# Patient Record
Sex: Female | Born: 1950
Health system: Southern US, Community
[De-identification: ages and names within clinical notes are randomized; demographics above are authoritative.]

## PROBLEM LIST (undated history)

## (undated) DIAGNOSIS — R51 Headache: Secondary | ICD-10-CM

## (undated) DIAGNOSIS — M67449 Ganglion, unspecified hand: Secondary | ICD-10-CM

## (undated) DIAGNOSIS — T4145XA Adverse effect of unspecified anesthetic, initial encounter: Secondary | ICD-10-CM

## (undated) DIAGNOSIS — M1990 Unspecified osteoarthritis, unspecified site: Secondary | ICD-10-CM

## (undated) DIAGNOSIS — Z98811 Dental restoration status: Secondary | ICD-10-CM

## (undated) DIAGNOSIS — E7800 Pure hypercholesterolemia, unspecified: Secondary | ICD-10-CM

## (undated) DIAGNOSIS — K219 Gastro-esophageal reflux disease without esophagitis: Secondary | ICD-10-CM

## (undated) DIAGNOSIS — T8859XA Other complications of anesthesia, initial encounter: Secondary | ICD-10-CM

## (undated) DIAGNOSIS — D649 Anemia, unspecified: Secondary | ICD-10-CM

## (undated) HISTORY — PX: APPENDECTOMY: SHX54

## (undated) HISTORY — DX: Pure hypercholesterolemia, unspecified: E78.00

## (undated) HISTORY — PX: CATARACT EXTRACTION: SUR2

## (undated) HISTORY — PX: ABDOMINAL HYSTERECTOMY: SHX81

## (undated) HISTORY — PX: OOPHORECTOMY: SHX86

## (undated) HISTORY — PX: ECTOPIC PREGNANCY SURGERY: SHX613

---

## 1999-01-23 ENCOUNTER — Other Ambulatory Visit: Admission: RE | Admit: 1999-01-23 | Discharge: 1999-01-23 | Payer: Self-pay | Admitting: Obstetrics and Gynecology

## 2000-04-08 ENCOUNTER — Other Ambulatory Visit: Admission: RE | Admit: 2000-04-08 | Discharge: 2000-04-08 | Payer: Self-pay | Admitting: Obstetrics and Gynecology

## 2001-08-06 ENCOUNTER — Other Ambulatory Visit: Admission: RE | Admit: 2001-08-06 | Discharge: 2001-08-06 | Payer: Self-pay | Admitting: Internal Medicine

## 2001-12-22 ENCOUNTER — Emergency Department (HOSPITAL_COMMUNITY): Admission: EM | Admit: 2001-12-22 | Discharge: 2001-12-22 | Payer: Self-pay | Admitting: Emergency Medicine

## 2001-12-22 ENCOUNTER — Encounter: Payer: Self-pay | Admitting: Emergency Medicine

## 2002-01-05 ENCOUNTER — Ambulatory Visit (HOSPITAL_COMMUNITY): Admission: RE | Admit: 2002-01-05 | Discharge: 2002-01-05 | Payer: Self-pay | Admitting: Orthopedic Surgery

## 2002-01-05 ENCOUNTER — Encounter: Payer: Self-pay | Admitting: Orthopedic Surgery

## 2003-06-06 ENCOUNTER — Ambulatory Visit (HOSPITAL_COMMUNITY): Admission: RE | Admit: 2003-06-06 | Discharge: 2003-06-06 | Payer: Self-pay | Admitting: Gastroenterology

## 2004-07-03 ENCOUNTER — Ambulatory Visit (HOSPITAL_COMMUNITY): Admission: RE | Admit: 2004-07-03 | Discharge: 2004-07-03 | Payer: Self-pay | Admitting: Chiropractic Medicine

## 2005-02-24 IMAGING — CR DG LUMBAR SPINE COMPLETE 4+V
5 series · 5 of 5 positions shown · non-contrast
Comparison: none

CLINICAL DATA: Low back and left leg pain following a twisting injury.  Previous pelvic fracture.  
COMPLETE LUMBAR SPINE ? 07/03/04
Five views demonstrate five non-rib bearing lumbar vertebrae.   Partially lumbarized S1 vertebra.  4 mm of retrolisthesis at the L5-S1 level.  Mild facet degenerative changes in the lower lumbar spine.  No pars defects seen.  Mild anterior spur formation at multiple levels.  No fractures.  Moderate anterior spur formation at the T10-11 level.    
IMPRESSION 
Degenerative changes, as described above.  No fracture.

[view not recorded (1 of 5)]
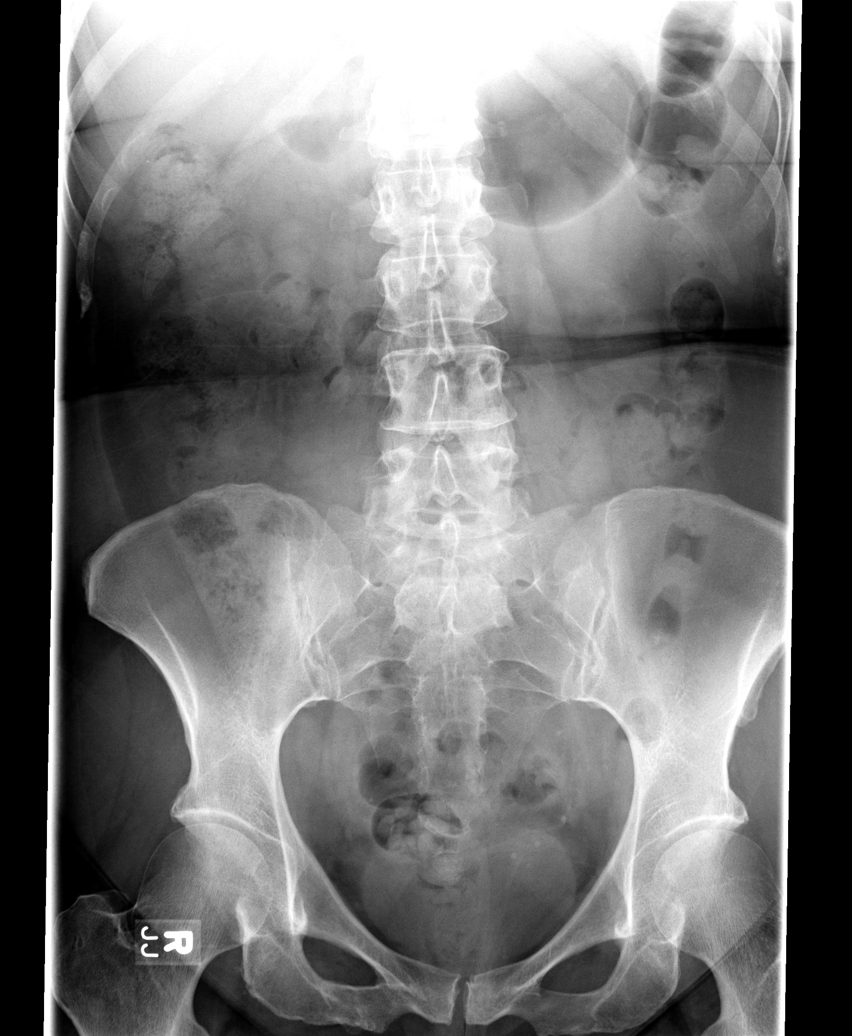

[view not recorded (2 of 5)]
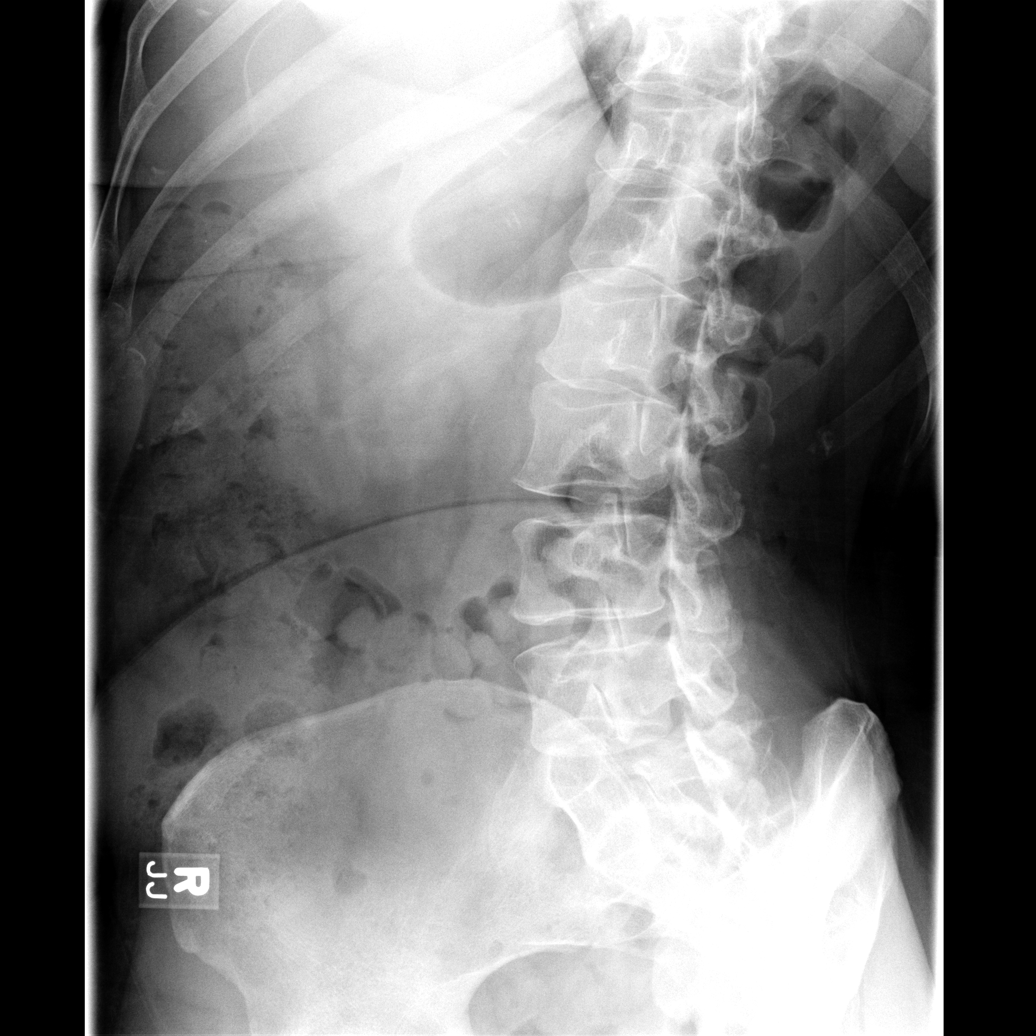

[view not recorded (3 of 5)]
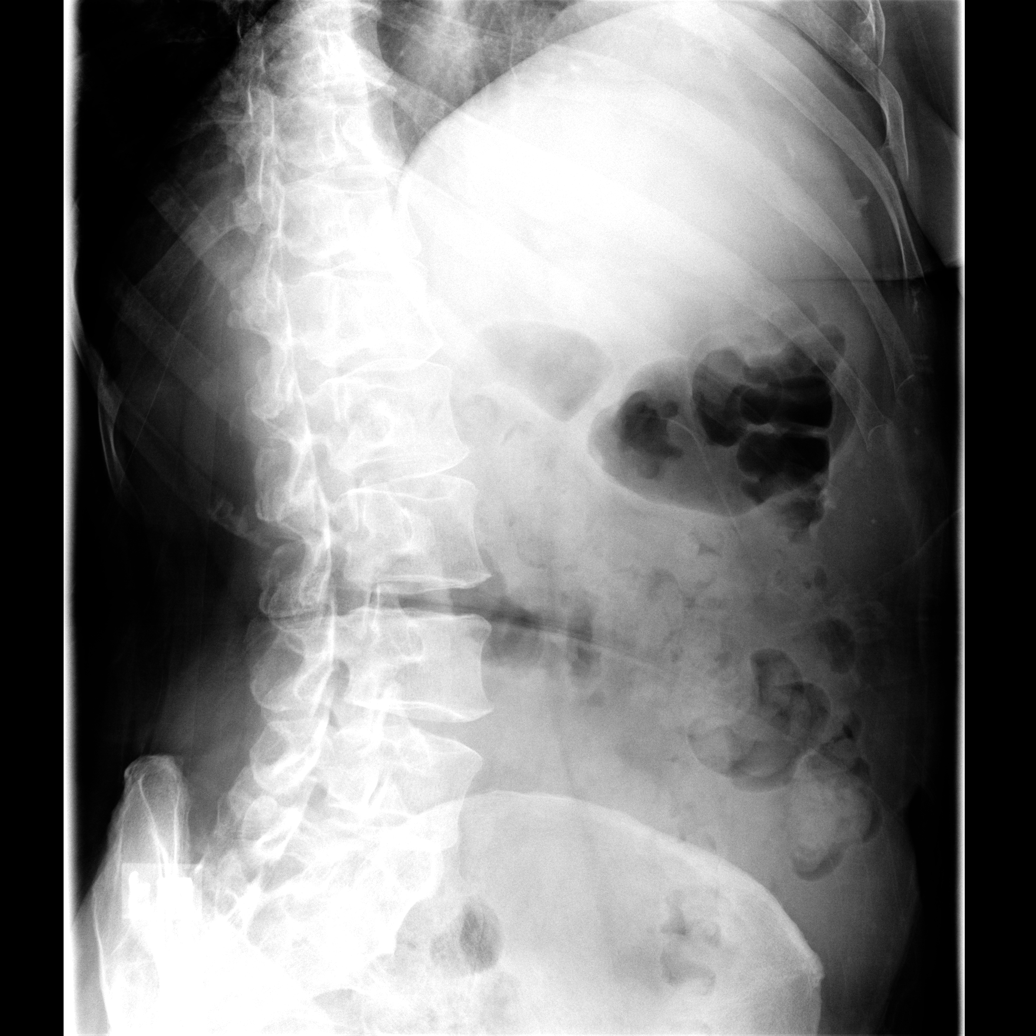

[view not recorded (4 of 5)]
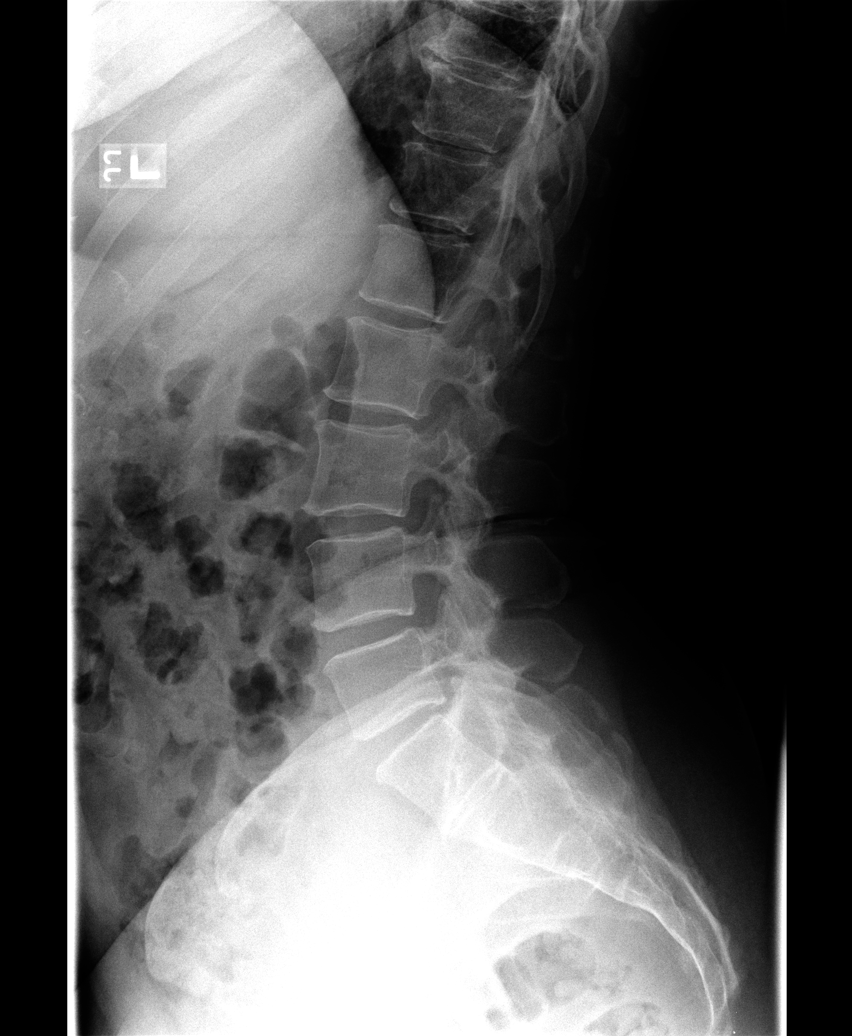

[view not recorded (5 of 5)]
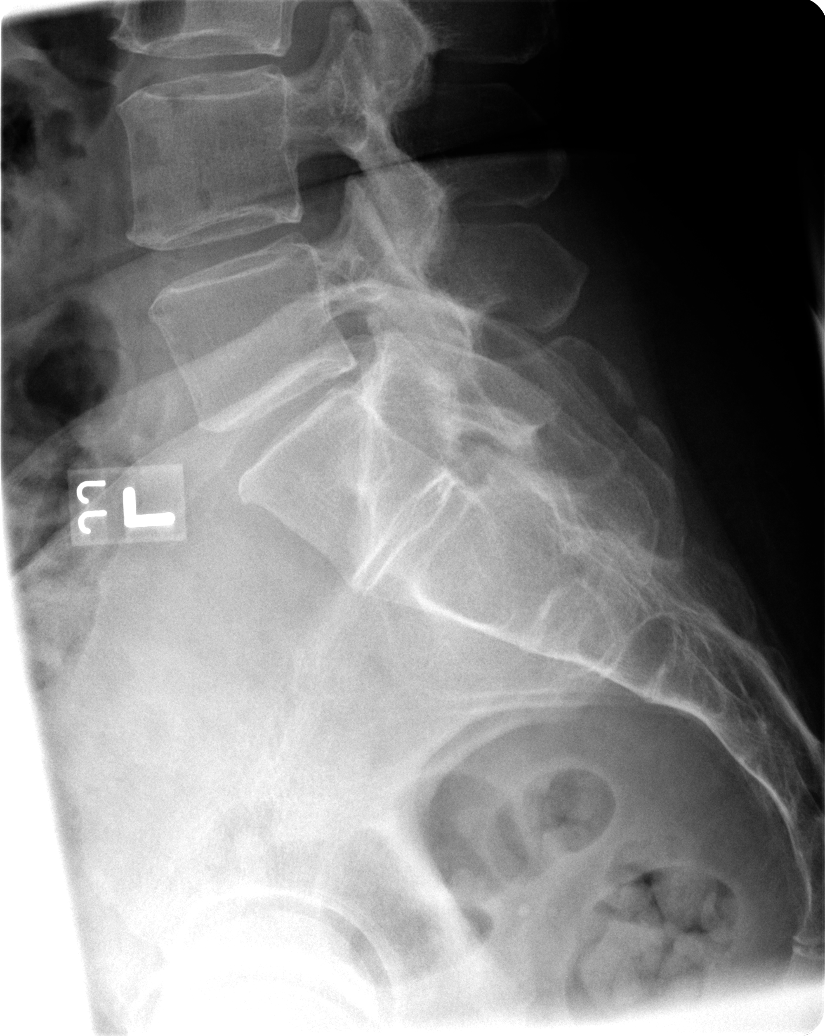

[5 of 5 positions shown; findings below may reference images not displayed]

## 2012-06-18 DIAGNOSIS — M67449 Ganglion, unspecified hand: Secondary | ICD-10-CM

## 2012-06-18 HISTORY — DX: Ganglion, unspecified hand: M67.449

## 2012-07-07 ENCOUNTER — Other Ambulatory Visit: Payer: Self-pay | Admitting: Orthopedic Surgery

## 2012-07-07 ENCOUNTER — Encounter (HOSPITAL_BASED_OUTPATIENT_CLINIC_OR_DEPARTMENT_OTHER): Payer: Self-pay | Admitting: *Deleted

## 2012-07-09 NOTE — H&P (Signed)
  Melissa Mcdowell is an 61 y.o. female.   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 She presents with her husband for evaluation of a cyst on the dorsal ulnar aspect of her left long finger. This has been present for more than 3 weeks.   Past Medical History  Diagnosis Date  . Headache     sinus - 3-4 x/month  . Anemia     no current med.  . Arthritis     lower back, fingers  . Acid reflux     occasional  . Mucous cyst of finger 06/2012    left long finger  . Dental crowns present   . Complication of anesthesia     states has a small mouth    Past Surgical History  Procedure Date  . Oophorectomy   . Cesarean section   . Abdominal hysterectomy     complete  . Ectopic pregnancy surgery   . Appendectomy     Family History  Problem Relation Age of Onset  . Anesthesia problems Father     post-op N/V   Social History:  reports that she has never smoked. She has never used smokeless tobacco. She reports that she drinks alcohol. She reports that she does not use illicit drugs.  Allergies:  Allergies  Allergen Reactions  . Sulfa Antibiotics Nausea And Vomiting  . Adhesive (Tape) Other (See Comments)    SKIN FLAKES OFF  . Morphine And Related Other (See Comments)    HYPERACTIVITY AND HAS NO RELIEF OF PAIN    No prescriptions prior to admission    No results found for this or any previous visit (from the past 48 hour(s)).  No results found.   Pertinent items are noted in HPI.  Height 5\' 4"  (1.626 m), weight 76.204 kg (168 lb).  General appearance: alert Head: Normocephalic, without obvious abnormality Neck: supple, symmetrical, trachea midline Resp: clear to auscultation bilaterally Cardio: regular rate and rhythm GI: normal findings: bowel sounds normal Extremities: Inspection of her hands reveals a 4 mm in diameter mucoid cyst in the dorsal ulnar aspect of her left long finger. She has full  ROM of her fingers in flexion/extension. I cannot detect an effusion. She has no nail deformities. Her pulses and capillary refill are intact.   X-rays of her finger 4 views demonstrates degenerative arthritis of the left long finger DIP joint with a loose body subjacent to the cyst.  Pulses: 2+ and symmetric Skin: normal Neurologic: Grossly normal    Assessment/Plan Impression: Mucoid cyst left long finger DIP joint  Plan: To the OR for excision mucoid cyst left long finger with DIP joint arthrotomy.The procedure, risks,benefits and post-op course were discussed with the patient at length and they were in agreement with the plan.   DASNOIT,Melissa Mcdowell 07/09/2012, 2:48 PM     H&P documentation: 07/10/2012  -History and Physical Reviewed  -Patient has been re-examined  -No change in the plan of care  Wyn Forster, MD

## 2012-07-10 ENCOUNTER — Encounter (HOSPITAL_BASED_OUTPATIENT_CLINIC_OR_DEPARTMENT_OTHER): Payer: Self-pay | Admitting: Anesthesiology

## 2012-07-10 ENCOUNTER — Encounter (HOSPITAL_BASED_OUTPATIENT_CLINIC_OR_DEPARTMENT_OTHER)
Admission: RE | Disposition: A | Discharge status: Home / Self Care | Payer: Self-pay | Source: Ambulatory Visit | Attending: Orthopedic Surgery

## 2012-07-10 ENCOUNTER — Encounter (HOSPITAL_BASED_OUTPATIENT_CLINIC_OR_DEPARTMENT_OTHER): Payer: Self-pay | Admitting: *Deleted

## 2012-07-10 ENCOUNTER — Ambulatory Visit (HOSPITAL_BASED_OUTPATIENT_CLINIC_OR_DEPARTMENT_OTHER)
Admission: RE | Admit: 2012-07-10 | Discharge: 2012-07-10 | Disposition: A | Discharge status: Home / Self Care | Payer: 59 | Source: Ambulatory Visit | Attending: Orthopedic Surgery | Admitting: Orthopedic Surgery

## 2012-07-10 DIAGNOSIS — M129 Arthropathy, unspecified: Secondary | ICD-10-CM | POA: Insufficient documentation

## 2012-07-10 DIAGNOSIS — M24049 Loose body in unspecified finger joint(s): Secondary | ICD-10-CM | POA: Insufficient documentation

## 2012-07-10 DIAGNOSIS — M6740 Ganglion, unspecified site: Secondary | ICD-10-CM | POA: Insufficient documentation

## 2012-07-10 DIAGNOSIS — R51 Headache: Secondary | ICD-10-CM | POA: Insufficient documentation

## 2012-07-10 DIAGNOSIS — K219 Gastro-esophageal reflux disease without esophagitis: Secondary | ICD-10-CM | POA: Insufficient documentation

## 2012-07-10 HISTORY — PX: MASS EXCISION: SHX2000

## 2012-07-10 HISTORY — DX: Gastro-esophageal reflux disease without esophagitis: K21.9

## 2012-07-10 HISTORY — DX: Adverse effect of unspecified anesthetic, initial encounter: T41.45XA

## 2012-07-10 HISTORY — DX: Headache: R51

## 2012-07-10 HISTORY — DX: Unspecified osteoarthritis, unspecified site: M19.90

## 2012-07-10 HISTORY — DX: Other complications of anesthesia, initial encounter: T88.59XA

## 2012-07-10 HISTORY — DX: Dental restoration status: Z98.811

## 2012-07-10 HISTORY — DX: Anemia, unspecified: D64.9

## 2012-07-10 HISTORY — DX: Ganglion, unspecified hand: M67.449

## 2012-07-10 SURGERY — MINOR EXCISION OF MASS
Anesthesia: LOCAL | Site: Finger | Laterality: Left | Wound class: Clean

## 2012-07-10 MED ORDER — LACTATED RINGERS IV SOLN: INTRAVENOUS | Status: DC

## 2012-07-10 MED ORDER — DOXYCYCLINE HYCLATE 100 MG PO TABS: 100.0000 mg | ORAL_TABLET | Freq: Two times a day (BID) | ORAL | Status: AC

## 2012-07-10 MED ORDER — HYDROCODONE-ACETAMINOPHEN 5-325 MG PO TABS: ORAL_TABLET | ORAL | Status: AC

## 2012-07-10 MED ORDER — CHLORHEXIDINE GLUCONATE 4 % EX LIQD: 60.0000 mL | Freq: Once | CUTANEOUS | Status: DC

## 2012-07-10 MED ORDER — LIDOCAINE HCL (PF) 2 % IJ SOLN
INTRAMUSCULAR | Status: DC | PRN
  Administered 2012-07-10: 4 mL

## 2012-07-10 SURGICAL SUPPLY — 53 items
BANDAGE ADHESIVE 1X3 (GAUZE/BANDAGES/DRESSINGS) IMPLANT
BANDAGE ELASTIC 3 VELCRO ST LF (GAUZE/BANDAGES/DRESSINGS) IMPLANT
BLADE MINI RND TIP GREEN BEAV (BLADE) IMPLANT
BLADE SURG 15 STRL LF DISP TIS (BLADE) ×2 IMPLANT
BLADE SURG 15 STRL SS (BLADE) ×3
BNDG CMPR 9X4 STRL LF SNTH (GAUZE/BANDAGES/DRESSINGS)
BNDG CMPR MD 5X2 ELC HKLP STRL (GAUZE/BANDAGES/DRESSINGS)
BNDG COHESIVE 1X5 TAN STRL LF (GAUZE/BANDAGES/DRESSINGS) ×3 IMPLANT
BNDG ELASTIC 2 VLCR STRL LF (GAUZE/BANDAGES/DRESSINGS) IMPLANT
BNDG ESMARK 4X9 LF (GAUZE/BANDAGES/DRESSINGS) IMPLANT
BRUSH SCRUB EZ PLAIN DRY (MISCELLANEOUS) ×3 IMPLANT
CLOTH BEACON ORANGE TIMEOUT ST (SAFETY) ×3 IMPLANT
CORDS BIPOLAR (ELECTRODE) IMPLANT
COVER MAYO STAND STRL (DRAPES) ×3 IMPLANT
COVER TABLE BACK 60X90 (DRAPES) ×1 IMPLANT
CUFF TOURNIQUET SINGLE 18IN (TOURNIQUET CUFF) IMPLANT
DECANTER SPIKE VIAL GLASS SM (MISCELLANEOUS) IMPLANT
DRAIN PENROSE 1/2X12 LTX STRL (WOUND CARE) ×2 IMPLANT
DRAIN PENROSE 1/4X12 LTX STRL (WOUND CARE) IMPLANT
DRAPE EXTREMITY T 121X128X90 (DRAPE) ×1 IMPLANT
DRAPE SURG 17X23 STRL (DRAPES) ×3 IMPLANT
GAUZE XEROFORM 1X8 LF (GAUZE/BANDAGES/DRESSINGS) ×2 IMPLANT
GLOVE BIOGEL PI IND STRL 8 (GLOVE) ×2 IMPLANT
GLOVE BIOGEL PI INDICATOR 8 (GLOVE) ×1
GLOVE ORTHO TXT STRL SZ7.5 (GLOVE) ×3 IMPLANT
GLOVE SKINSENSE NS SZ7.0 (GLOVE) ×1
GLOVE SKINSENSE STRL SZ7.0 (GLOVE) ×1 IMPLANT
GOWN PREVENTION PLUS XLARGE (GOWN DISPOSABLE) ×3 IMPLANT
GOWN STRL REIN XL XLG (GOWN DISPOSABLE) ×6 IMPLANT
NDL BLUNT 17GA (NEEDLE) ×1 IMPLANT
NEEDLE 27GAX1X1/2 (NEEDLE) ×2 IMPLANT
NEEDLE BLUNT 17GA (NEEDLE) ×3 IMPLANT
PACK BASIN DAY SURGERY FS (CUSTOM PROCEDURE TRAY) ×3 IMPLANT
PAD CAST 3X4 CTTN HI CHSV (CAST SUPPLIES) IMPLANT
PADDING CAST COTTON 3X4 STRL (CAST SUPPLIES)
PADDING UNDERCAST 2  STERILE (CAST SUPPLIES) IMPLANT
SPLINT PLASTER CAST XFAST 3X15 (CAST SUPPLIES) IMPLANT
SPLINT PLASTER XTRA FASTSET 3X (CAST SUPPLIES)
SPONGE GAUZE 4X4 12PLY (GAUZE/BANDAGES/DRESSINGS) IMPLANT
STOCKINETTE 4X48 STRL (DRAPES) ×3 IMPLANT
STRIP CLOSURE SKIN 1/2X4 (GAUZE/BANDAGES/DRESSINGS) IMPLANT
SUT ETHILON 5 0 P 3 18 (SUTURE) ×1
SUT MERSILENE 4 0 P 3 (SUTURE) IMPLANT
SUT NYLON ETHILON 5-0 P-3 1X18 (SUTURE) ×2 IMPLANT
SUT PROLENE 3 0 PS 2 (SUTURE) IMPLANT
SUT PROLENE 4 0 P 3 18 (SUTURE) ×2 IMPLANT
SYR 20CC LL (SYRINGE) ×3 IMPLANT
SYR 3ML 23GX1 SAFETY (SYRINGE) IMPLANT
SYR CONTROL 10ML LL (SYRINGE) ×2 IMPLANT
TOWEL OR 17X24 6PK STRL BLUE (TOWEL DISPOSABLE) ×3 IMPLANT
TRAY DSU PREP LF (CUSTOM PROCEDURE TRAY) ×3 IMPLANT
UNDERPAD 30X30 INCONTINENT (UNDERPADS AND DIAPERS) ×3 IMPLANT
WATER STERILE IRR 1000ML POUR (IV SOLUTION) IMPLANT

## 2012-07-10 NOTE — Anesthesia Preprocedure Evaluation (Deleted)
Anesthesia Evaluation  Patient identified by MRN, date of birth, ID band Patient awake    Reviewed: Allergy & Precautions, H&P , NPO status , Patient's Chart, lab work & pertinent test results  Airway Mallampati: II  Neck ROM: full    Dental   Pulmonary          Cardiovascular     Neuro/Psych  Headaches,    GI/Hepatic GERD-  ,  Endo/Other    Renal/GU      Musculoskeletal  (+) Arthritis -,   Abdominal   Peds  Hematology   Anesthesia Other Findings   Reproductive/Obstetrics                           Anesthesia Physical Anesthesia Plan  ASA: II  Anesthesia Plan:    Post-op Pain Management:    Induction:   Airway Management Planned: Simple Face Mask  Additional Equipment:   Intra-op Plan:   Post-operative Plan:   Informed Consent: I have reviewed the patients History and Physical, chart, labs and discussed the procedure including the risks, benefits and alternatives for the proposed anesthesia with the patient or authorized representative who has indicated his/her understanding and acceptance.     Plan Discussed with: CRNA and Surgeon  Anesthesia Plan Comments:        Anesthesia Quick Evaluation

## 2012-07-10 NOTE — Progress Notes (Signed)
After discussion with Dr. Chaney Malling and Dr. Teressa Senter, decision made to use straight local.

## 2012-07-10 NOTE — Brief Op Note (Signed)
07/10/2012  9:02 AM  PATIENT:  Melissa Mcdowell  61 y.o. female  PRE-OPERATIVE DIAGNOSIS:  mucoid cyst left long finger  POST-OPERATIVE DIAGNOSIS:  Mucoid cyst with dip loose body left long  PROCEDURE: excision of mucoid cyst left long finger with dip joint debridement   SURGEON:  Surgeon(s) and Role:    * Wyn Forster., MD - Primary  PHYSICIAN ASSISTANT:   ASSISTANTS: Mallory Shirk.A-C    ANESTHESIA:   local  EBL:     BLOOD ADMINISTERED:none  DRAINS: none   LOCAL MEDICATIONS USED:  LIDOCAINE   SPECIMEN:  No Specimen  DISPOSITION OF SPECIMEN:  N/A  COUNTS:  YES  TOURNIQUET:  * Missing tourniquet times found for documented tourniquets in log:  16109 *  DICTATION: .Other Dictation: Dictation Number [id number]  PLAN OF CARE: Discharge to home after PACU  PATIENT DISPOSITION:  PACU - hemodynamically stable.

## 2012-07-10 NOTE — Op Note (Signed)
[id number] 

## 2012-07-13 NOTE — Op Note (Signed)
NAMEJANI, Melissa Mcdowell            ACCOUNT NO.:  [id number]  MEDICAL RECORD NO.:  [id number]  LOCATION:                                 FACILITY:  PHYSICIAN:  Katy Fitch. Jackalyn Haith, M.D. DATE OF BIRTH:  [birth year]  DATE OF PROCEDURE:  07/10/2012 DATE OF DISCHARGE:                              OPERATIVE REPORT   PREOPERATIVE DIAGNOSIS:  Mucoid cyst, left long finger distal interphalangeal joint with x-ray documentation of loose body, ulnar aspect of distal interphalangeal joint adjacent to ulnar collateral ligament.  POSTOPERATIVE DIAGNOSIS:  Mucoid cyst, left long finger distal interphalangeal joint with x-ray documentation of loose body, ulnar aspect of distal interphalangeal joint adjacent to ulnar collateral ligament.  OPERATION:  Debridement of mucoid cyst and DIP joint arthrotomy, synovectomy, and removal of loose body.  OPERATING SURGEON:  Katy Fitch. Nayel Purdy, MD  ASSISTANT:  Marveen Reeks Dasnoit, PA-C  ANESTHESIA:  A 2% lidocaine metacarpal head level block of left long finger.  This was performed by Dr. Teressa Senter.  This is a minor operating room procedure.  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  She has had a history of mucoid cyst developed on dorsal ulnar aspect of her left long finger.  She reviewed this with Dr. Pete Glatter, her primary care physician, and it was advised to seek Hand Surgery consult.  Clinical examination revealed a 5-mm diameter mucoid cyst that was quite tense.  She had not developed drainage nor signs of sepsis. She did not have a nail deformity.  We advised her to undergo x-ray evaluation of the finger and debridement.  Her x-ray did reveal loose body adjacent to the ulnar collateral ligament in the DIP joint.  Preoperative question invited and answered in detail.  PROCEDURE IN DETAIL:  The patient was brought to room 1 at [facility] and placed in supine position on the operating table.  Following the  performance of routine surgical time-out, the long finger was prepped with Betadine and 4 mL of 2% lidocaine were infiltrated at metacarpal head level to obtain a digital block.  The left hand and arm were then prepped with Betadine soap and solution, sterilely draped.  Following exsanguination of the left long finger with a gauze wrap, a 0.5 inch Penrose drain was placed with the proximal phalangeal segment as a digital tourniquet.  Procedure commenced with an incision  incorporating the cyst. Subcutaneous tissues were carefully divided taking care to identify the membrane of the cyst, removed mucinous contents and the membrane.  A capsulotomy was performed between the terminal extensor tendon and the ulnar collateral ligament.  After synovectomy, the  loose bodies were identified and removed with a micro rongeur.  We showed Melissa Mcdowell the loose body.  A synovectomy was accomplished with a micro rongeur followed by irrigation of the joint with a 19-gauge blunt dental needle.  The wound was then repaired with intradermal 4-0 Prolene.  The finger was then dressed with Xeroflo sterile gauze and a Coban finger dressing.  We have advised Melissa Mcdowell to use Aleve 440 mg morning and evening as an analgesic and we will provide Vicodin 5 mg 1 p.o. q.4-6 hours p.r.n. pain as  an adjunctive analgesic.  We will see her back in followup in a week for suture removal.     Katy Fitch. Breannah Kratt, M.D.     RVS/MEDQ  D:  07/10/2012  T:  07/10/2012  Job:  [id number]  cc:   Hal T. Stoneking, M.D.

## 2012-07-14 ENCOUNTER — Encounter (HOSPITAL_BASED_OUTPATIENT_CLINIC_OR_DEPARTMENT_OTHER): Payer: Self-pay | Admitting: Orthopedic Surgery

## 2018-11-09 DIAGNOSIS — Z1231 Encounter for screening mammogram for malignant neoplasm of breast: Secondary | ICD-10-CM | POA: Diagnosis not present

## 2018-11-24 DIAGNOSIS — E7800 Pure hypercholesterolemia, unspecified: Secondary | ICD-10-CM | POA: Diagnosis not present

## 2018-11-24 DIAGNOSIS — Z79899 Other long term (current) drug therapy: Secondary | ICD-10-CM | POA: Diagnosis not present

## 2018-11-24 DIAGNOSIS — Z0000 Encounter for general adult medical examination without abnormal findings: Secondary | ICD-10-CM | POA: Diagnosis not present

## 2018-11-24 DIAGNOSIS — Z1389 Encounter for screening for other disorder: Secondary | ICD-10-CM | POA: Diagnosis not present

## 2018-11-30 DIAGNOSIS — H33312 Horseshoe tear of retina without detachment, left eye: Secondary | ICD-10-CM | POA: Diagnosis not present

## 2018-11-30 DIAGNOSIS — H33191 Other retinoschisis and retinal cysts, right eye: Secondary | ICD-10-CM | POA: Diagnosis not present

## 2018-11-30 DIAGNOSIS — H35372 Puckering of macula, left eye: Secondary | ICD-10-CM | POA: Diagnosis not present

## 2018-11-30 DIAGNOSIS — H43812 Vitreous degeneration, left eye: Secondary | ICD-10-CM | POA: Diagnosis not present

## 2018-11-30 DIAGNOSIS — H35342 Macular cyst, hole, or pseudohole, left eye: Secondary | ICD-10-CM | POA: Diagnosis not present

## 2018-12-23 ENCOUNTER — Other Ambulatory Visit: Payer: Self-pay

## 2018-12-23 ENCOUNTER — Ambulatory Visit (HOSPITAL_COMMUNITY): Payer: Medicare Other | Admitting: Certified Registered Nurse Anesthetist

## 2018-12-23 ENCOUNTER — Encounter (HOSPITAL_COMMUNITY): Payer: Self-pay | Admitting: *Deleted

## 2018-12-23 ENCOUNTER — Ambulatory Visit (HOSPITAL_COMMUNITY)
Admission: RE | Admit: 2018-12-23 | Discharge: 2018-12-23 | Disposition: A | Discharge status: Home / Self Care | Payer: Medicare Other | Source: Ambulatory Visit | Attending: Ophthalmology | Admitting: Ophthalmology

## 2018-12-23 ENCOUNTER — Ambulatory Visit: Payer: Self-pay | Admitting: Ophthalmology

## 2018-12-23 ENCOUNTER — Encounter (HOSPITAL_COMMUNITY)
Admission: RE | Disposition: A | Discharge status: Home / Self Care | Payer: Self-pay | Source: Ambulatory Visit | Attending: Ophthalmology

## 2018-12-23 DIAGNOSIS — Z79890 Hormone replacement therapy (postmenopausal): Secondary | ICD-10-CM | POA: Insufficient documentation

## 2018-12-23 DIAGNOSIS — Z79899 Other long term (current) drug therapy: Secondary | ICD-10-CM | POA: Insufficient documentation

## 2018-12-23 DIAGNOSIS — H33022 Retinal detachment with multiple breaks, left eye: Secondary | ICD-10-CM | POA: Diagnosis not present

## 2018-12-23 DIAGNOSIS — Z880 Allergy status to penicillin: Secondary | ICD-10-CM | POA: Diagnosis not present

## 2018-12-23 DIAGNOSIS — K219 Gastro-esophageal reflux disease without esophagitis: Secondary | ICD-10-CM | POA: Insufficient documentation

## 2018-12-23 DIAGNOSIS — Z882 Allergy status to sulfonamides status: Secondary | ICD-10-CM | POA: Diagnosis not present

## 2018-12-23 DIAGNOSIS — H3322 Serous retinal detachment, left eye: Secondary | ICD-10-CM | POA: Insufficient documentation

## 2018-12-23 DIAGNOSIS — Z791 Long term (current) use of non-steroidal anti-inflammatories (NSAID): Secondary | ICD-10-CM | POA: Insufficient documentation

## 2018-12-23 DIAGNOSIS — H31092 Other chorioretinal scars, left eye: Secondary | ICD-10-CM | POA: Diagnosis not present

## 2018-12-23 DIAGNOSIS — Z885 Allergy status to narcotic agent status: Secondary | ICD-10-CM | POA: Diagnosis not present

## 2018-12-23 DIAGNOSIS — H35372 Puckering of macula, left eye: Secondary | ICD-10-CM | POA: Diagnosis not present

## 2018-12-23 DIAGNOSIS — H53452 Other localized visual field defect, left eye: Secondary | ICD-10-CM | POA: Diagnosis not present

## 2018-12-23 DIAGNOSIS — H33012 Retinal detachment with single break, left eye: Secondary | ICD-10-CM | POA: Diagnosis not present

## 2018-12-23 DIAGNOSIS — H33102 Unspecified retinoschisis, left eye: Secondary | ICD-10-CM | POA: Diagnosis not present

## 2018-12-23 HISTORY — PX: REPAIR OF COMPLEX TRACTION RETINAL DETACHMENT: SHX6217

## 2018-12-23 LAB — CBC
HCT: 41 % (ref 36.0–46.0)
HEMOGLOBIN: 13.2 g/dL (ref 12.0–15.0)
MCH: 31.2 pg (ref 26.0–34.0)
MCHC: 32.2 g/dL (ref 30.0–36.0)
MCV: 96.9 fL (ref 80.0–100.0)
Platelets: 199 10*3/uL (ref 150–400)
RBC: 4.23 MIL/uL (ref 3.87–5.11)
RDW: 14.1 % (ref 11.5–15.5)
WBC: 6.2 10*3/uL (ref 4.0–10.5)
nRBC: 0 % (ref 0.0–0.2)

## 2018-12-23 SURGERY — REPAIR, RETINAL DETACHMENT, COMPLEX
Anesthesia: Monitor Anesthesia Care | Site: Eye | Laterality: Left

## 2018-12-23 MED ORDER — EPINEPHRINE PF 1 MG/ML IJ SOLN
INTRAMUSCULAR | Status: AC
  Filled 2018-12-23: qty 1

## 2018-12-23 MED ORDER — BSS PLUS IO SOLN
INTRAOCULAR | Status: DC | PRN
  Administered 2018-12-23: 1 via INTRAOCULAR

## 2018-12-23 MED ORDER — LIDOCAINE HCL (PF) 1 % IJ SOLN: INTRAMUSCULAR | Status: DC | PRN

## 2018-12-23 MED ORDER — OFLOXACIN 0.3 % OP SOLN
1.0000 [drp] | OPHTHALMIC | Status: AC | PRN
  Administered 2018-12-23 (×3): 1 [drp] via OPHTHALMIC

## 2018-12-23 MED ORDER — PHENYLEPHRINE HCL 2.5 % OP SOLN
OPHTHALMIC | Status: AC
Start: 2018-12-23 — End: 2018-12-23
  Administered 2018-12-23: 1 [drp] via OPHTHALMIC
  Filled 2018-12-23: qty 2

## 2018-12-23 MED ORDER — MIDAZOLAM HCL 5 MG/5ML IJ SOLN
INTRAMUSCULAR | Status: DC | PRN
  Administered 2018-12-23: 1 mg via INTRAVENOUS

## 2018-12-23 MED ORDER — HYALURONIDASE HUMAN 150 UNIT/ML IJ SOLN
INTRAMUSCULAR | Status: AC
  Filled 2018-12-23: qty 1

## 2018-12-23 MED ORDER — PROPOFOL 10 MG/ML IV BOLUS
INTRAVENOUS | Status: DC | PRN
  Administered 2018-12-23: 20 mg via INTRAVENOUS
  Administered 2018-12-23: 40 mg via INTRAVENOUS

## 2018-12-23 MED ORDER — ATROPINE SULFATE 1 % OP SOLN
OPHTHALMIC | Status: AC
  Filled 2018-12-23: qty 5

## 2018-12-23 MED ORDER — FENTANYL CITRATE (PF) 100 MCG/2ML IJ SOLN: 25.0000 ug | INTRAMUSCULAR | Status: DC | PRN

## 2018-12-23 MED ORDER — BUPIVACAINE HCL (PF) 0.75 % IJ SOLN
INTRAMUSCULAR | Status: AC
  Filled 2018-12-23: qty 10

## 2018-12-23 MED ORDER — INDOCYANINE GREEN 25 MG IV SOLR
INTRAVENOUS | Status: AC
  Filled 2018-12-23: qty 25

## 2018-12-23 MED ORDER — CEFAZOLIN SODIUM 1 G IJ SOLR
INTRAMUSCULAR | Status: DC | PRN
  Administered 2018-12-23: 100 mg via INTRAMUSCULAR

## 2018-12-23 MED ORDER — CYCLOPENTOLATE HCL 1 % OP SOLN
1.0000 [drp] | OPHTHALMIC | Status: AC | PRN
  Administered 2018-12-23 (×3): 1 [drp] via OPHTHALMIC

## 2018-12-23 MED ORDER — BSS PLUS IO SOLN
INTRAOCULAR | Status: AC
  Filled 2018-12-23: qty 500

## 2018-12-23 MED ORDER — SODIUM CHLORIDE 0.9 % IV SOLN
INTRAVENOUS | Status: DC
  Administered 2018-12-23: 18:00:00 via INTRAVENOUS

## 2018-12-23 MED ORDER — LIDOCAINE HCL 2 % IJ SOLN
INTRAMUSCULAR | Status: AC
  Filled 2018-12-23: qty 20

## 2018-12-23 MED ORDER — CYCLOPENTOLATE HCL 1 % OP SOLN
OPHTHALMIC | Status: AC
  Administered 2018-12-23: 1 [drp] via OPHTHALMIC
  Filled 2018-12-23: qty 2

## 2018-12-23 MED ORDER — PHENYLEPHRINE HCL 2.5 % OP SOLN
1.0000 [drp] | OPHTHALMIC | Status: AC | PRN
  Administered 2018-12-23 (×3): 1 [drp] via OPHTHALMIC

## 2018-12-23 MED ORDER — SODIUM HYALURONATE 10 MG/ML IO SOLN
INTRAOCULAR | Status: AC
  Filled 2018-12-23: qty 0.85

## 2018-12-23 MED ORDER — DEXAMETHASONE SODIUM PHOSPHATE 10 MG/ML IJ SOLN
INTRAMUSCULAR | Status: DC | PRN
  Administered 2018-12-23: 10 mg

## 2018-12-23 MED ORDER — HYALURONIDASE HUMAN 150 UNIT/ML IJ SOLN
INTRAMUSCULAR | Status: DC | PRN
  Administered 2018-12-23: 150 [IU]

## 2018-12-23 MED ORDER — CEFAZOLIN SUBCONJUNCTIVAL INJECTION 100 MG/0.5 ML
100.0000 mg | INJECTION | SUBCONJUNCTIVAL | Status: DC
  Filled 2018-12-23: qty 5

## 2018-12-23 MED ORDER — TOBRAMYCIN-DEXAMETHASONE 0.3-0.1 % OP OINT
TOPICAL_OINTMENT | OPHTHALMIC | Status: AC
  Filled 2018-12-23: qty 3.5

## 2018-12-23 MED ORDER — EPINEPHRINE PF 1 MG/ML IJ SOLN
INTRAMUSCULAR | Status: DC | PRN
  Administered 2018-12-23: .3 mL

## 2018-12-23 MED ORDER — FENTANYL CITRATE (PF) 250 MCG/5ML IJ SOLN
INTRAMUSCULAR | Status: AC
  Filled 2018-12-23: qty 5

## 2018-12-23 MED ORDER — TETRACAINE HCL 0.5 % OP SOLN
OPHTHALMIC | Status: AC
  Filled 2018-12-23: qty 4

## 2018-12-23 MED ORDER — TETRACAINE HCL 0.5 % OP SOLN
OPHTHALMIC | Status: DC | PRN
  Administered 2018-12-23: 2 [drp] via OPHTHALMIC

## 2018-12-23 MED ORDER — MIDAZOLAM HCL 2 MG/2ML IJ SOLN
INTRAMUSCULAR | Status: AC
  Filled 2018-12-23: qty 2

## 2018-12-23 MED ORDER — DEXAMETHASONE SODIUM PHOSPHATE 10 MG/ML IJ SOLN
INTRAMUSCULAR | Status: AC
  Filled 2018-12-23: qty 1

## 2018-12-23 MED ORDER — BSS IO SOLN
INTRAOCULAR | Status: AC
  Filled 2018-12-23: qty 15

## 2018-12-23 MED ORDER — LIDOCAINE HCL 2 % IJ SOLN
INTRAMUSCULAR | Status: DC | PRN
  Administered 2018-12-23: 6 mL via OPHTHALMIC

## 2018-12-23 MED ORDER — OFLOXACIN 0.3 % OP SOLN
OPHTHALMIC | Status: AC
  Administered 2018-12-23: 1 [drp] via OPHTHALMIC
  Filled 2018-12-23: qty 5

## 2018-12-23 SURGICAL SUPPLY — 54 items
APL SRG 3 HI ABS STRL LF PLS (MISCELLANEOUS) ×1
APPLICATOR COTTON TIP 6IN STRL (MISCELLANEOUS) ×2 IMPLANT
APPLICATOR DR MATTHEWS STRL (MISCELLANEOUS) ×2 IMPLANT
BLADE MINI 60D BLUE (BLADE) ×2 IMPLANT
BLADE MINI RND TIP GREEN BEAV (BLADE) IMPLANT
CABLE BIPOLOR RESECTION CORD (MISCELLANEOUS) ×1 IMPLANT
CANNULA ANT CHAM MAIN (OPHTHALMIC RELATED) IMPLANT
CANNULA DUALBORE 25G (CANNULA) ×2 IMPLANT
CAUTERY EYE LOW TEMP 1300F FIN (OPHTHALMIC RELATED) IMPLANT
CORDS BIPOLAR (ELECTRODE) IMPLANT
COVER SURGICAL LIGHT HANDLE (MISCELLANEOUS) ×2 IMPLANT
COVER WAND RF STERILE (DRAPES) ×2 IMPLANT
FILTER BLUE MILLIPORE (MISCELLANEOUS) IMPLANT
FILTER STRAW FLUID ASPIR (MISCELLANEOUS) IMPLANT
GAS AUTO FILL CONSTEL (OPHTHALMIC) ×2
GAS AUTO FILL CONSTELLATION (OPHTHALMIC) IMPLANT
GAS OPHTHALMIC (MISCELLANEOUS) IMPLANT
GLOVE ECLIPSE 7.5 STRL STRAW (GLOVE) ×2 IMPLANT
GOWN STRL REUS W/ TWL LRG LVL3 (GOWN DISPOSABLE) ×2 IMPLANT
GOWN STRL REUS W/TWL LRG LVL3 (GOWN DISPOSABLE) ×4
KIT BASIN OR (CUSTOM PROCEDURE TRAY) ×2 IMPLANT
KIT PERFLUORON PROCEDURE 5ML (MISCELLANEOUS) IMPLANT
KIT TURNOVER KIT B (KITS) ×2 IMPLANT
NDL 18GX1X1/2 (RX/OR ONLY) (NEEDLE) ×1 IMPLANT
NDL HYPO 25GX1X1/2 BEV (NEEDLE) IMPLANT
NDL HYPO 30X.5 LL (NEEDLE) ×2 IMPLANT
NEEDLE 18GX1X1/2 (RX/OR ONLY) (NEEDLE) ×2 IMPLANT
NEEDLE HYPO 25GX1X1/2 BEV (NEEDLE) IMPLANT
NEEDLE HYPO 30X.5 LL (NEEDLE) ×4 IMPLANT
NS IRRIG 1000ML POUR BTL (IV SOLUTION) ×2 IMPLANT
PACK VITRECTOMY CUSTOM (CUSTOM PROCEDURE TRAY) ×2 IMPLANT
PAD ARMBOARD 7.5X6 YLW CONV (MISCELLANEOUS) ×4 IMPLANT
PAK VITRECTOMY PIK 25 GA (OPHTHALMIC RELATED) IMPLANT
PROBE ENDO DIATHERMY 25G (MISCELLANEOUS) ×1 IMPLANT
PROBE LASER ILLUM FLEX CVD 25G (OPHTHALMIC) ×1 IMPLANT
REPL STRA BRUSH NDL (NEEDLE) IMPLANT
REPL STRA BRUSH NEEDLE (NEEDLE) IMPLANT
RESERVOIR BACK FLUSH (MISCELLANEOUS) IMPLANT
RETRACTOR IRIS FLEX 25G GRIESH (INSTRUMENTS) IMPLANT
ROLLS DENTAL (MISCELLANEOUS) IMPLANT
SET FLUID INJECTOR (SET/KITS/TRAYS/PACK) IMPLANT
SHEET MEDIUM DRAPE 40X70 STRL (DRAPES) ×2 IMPLANT
SHIELD EYE LENSE ONLY DISP (GAUZE/BANDAGES/DRESSINGS) ×1 IMPLANT
SOLUTION ANTI FOG 6CC (MISCELLANEOUS) ×2 IMPLANT
STOCKINETTE IMPERVIOUS 9X36 MD (GAUZE/BANDAGES/DRESSINGS) ×4 IMPLANT
STOPCOCK 4 WAY LG BORE MALE ST (IV SETS) IMPLANT
SUT ETHILON 5.0 S-24 (SUTURE) ×2 IMPLANT
SUT SILK 2 0 (SUTURE) ×2
SUT SILK 2-0 18XBRD TIE 12 (SUTURE) ×1 IMPLANT
SUT VICRYL 7 0 TG140 8 (SUTURE) IMPLANT
SYR 20CC LL (SYRINGE) IMPLANT
TOWEL OR 17X24 6PK STRL BLUE (TOWEL DISPOSABLE) ×4 IMPLANT
WATER STERILE IRR 1000ML POUR (IV SOLUTION) ×2 IMPLANT
WIPE INSTRUMENT VISIWIPE 73X73 (MISCELLANEOUS) IMPLANT

## 2018-12-23 NOTE — Transfer of Care (Signed)
Immediate Anesthesia Transfer of Care Note  Patient: Melissa Mcdowell  Procedure(s) Performed: REPAIR OF  RETINAL DETACHMENT LEFT EYE (Left Eye)  Patient Location: PACU  Anesthesia Type:MAC  Level of Consciousness: awake, alert , oriented and patient cooperative  Airway & Oxygen Therapy: Patient Spontanous Breathing  Post-op Assessment: Report given to RN, Post -op Vital signs reviewed and stable and Patient moving all extremities X 4  Post vital signs: Reviewed and stable  Last Vitals:  Vitals Value Taken Time  BP 121/66 12/23/2018  6:49 PM  Temp    Pulse 58 12/23/2018  6:53 PM  Resp 12 12/23/2018  6:53 PM  SpO2 99 % 12/23/2018  6:53 PM  Vitals shown include unvalidated device data.  Last Pain:  Vitals:   12/23/18 1646  TempSrc:   PainSc: 0-No pain      Patients Stated Pain Goal: 0 (41/99/14 4458)  Complications: No apparent anesthesia complications

## 2018-12-23 NOTE — Op Note (Signed)
Melissa Mcdowell 12/23/2018 Diagnosis: RETINAL DETACHMENT left eye  Procedure: Pars Plana Vitrectomy, Endolaser, Fluid Gas Exchange and posterior drain of subretinal fluid with SF6 gas tamponade Operative Eye:  left eye  Surgeon: Harrold Donath Estimated Blood Loss: minimal Specimens for Pathology:  None Complications: none   The  patient was prepped and draped in the usual fashion for ocular surgery on the  left eye .  A lid speculum was placed.  Infusion line and trocar was placed at the 4 o'clock position approximately 3.5 mm from the surgical limbus.   The infusion line was allowed to run and then clamped when placed at the cannula opening. The line was inserted and secured to the drape with an adhesive strip.   Active trocars/cannula were placed at the 10 and 2 o'clock positions approximately 3.5 mm from the surgical limbus. The cannula was visualized in the vitreous cavity.  The light pipe and vitreous cutter were inserted into the vitreous cavity and a core vitrectomy was performed.  Care taken to remove the vitreous up to the vitreous base for 360 degrees with the aid of scleral depression.  The vitreous was carefully shaved over the retinal detachment.  A posterior retinotomy was made with endocautery to drain the subretinal fluid.  Thick subretinal fluid was drained and the retina flattened.  A complete air/fluid exchange was performed and further drainage of subretinal fluid was performed.  The retina was attached.  3 rows of endolaser were applied 360 degrees to the periphery and surrounding the temporal breaks.  16% SF6 gas was placed in the eye.  The superior cannulas were sequentially removed with concommitant tamponade using a cotton tipped applicator and noted to be air tight.  The infusion line and trocar were removed and the sclerotomy was noted to be air tight with normal intraocular pressure by digital palpapation.  Subconjunctival injections of  Dexamethasone 4mg /67ml  was placed in the infero-medial quadrant.   The speculum and drapes were removed and the eye was patched with Polymixin/Bacitracin ophthalmic ointment. An eye shield was placed and the patient was transferred alert and conversant with stable vital signs to the post operative recovery area.  The patient tolerated the procedure well and no complications were noted.  Harrold Donath MD

## 2018-12-23 NOTE — Discharge Instructions (Signed)
DO NOT SLEEP ON BACK, THE EYE PRESSURE CAN GO UP AND CAUSE VISION LOSS   SLEEP ON SIDE WITH NOSE TO PILLOW  DURING DAY KEEP FACE DOWN.  15 MINUTES EVERY 2 HOURS IT IS OK TO LOOK STRAIGHT AHEAD (USE BATHROOM, EAT, WALK, ETC.)  

## 2018-12-23 NOTE — Anesthesia Preprocedure Evaluation (Addendum)
Anesthesia Evaluation  Patient identified by MRN, date of birth, ID band Patient awake    Reviewed: Allergy & Precautions, NPO status , Patient's Chart, lab work & pertinent test results  Airway Mallampati: I  TM Distance: >3 FB Neck ROM: Full    Dental no notable dental hx. (+) Teeth Intact, Dental Advisory Given, Caps   Pulmonary neg pulmonary ROS,    Pulmonary exam normal breath sounds clear to auscultation       Cardiovascular negative cardio ROS Normal cardiovascular exam Rhythm:Regular Rate:Normal     Neuro/Psych  Headaches, negative psych ROS   GI/Hepatic Neg liver ROS, GERD  ,  Endo/Other  negative endocrine ROS  Renal/GU negative Renal ROS  negative genitourinary   Musculoskeletal  (+) Arthritis ,   Abdominal   Peds  Hematology  (+) Blood dyscrasia, anemia ,   Anesthesia Other Findings   Reproductive/Obstetrics negative OB ROS                            Anesthesia Physical Anesthesia Plan  ASA: II  Anesthesia Plan: MAC   Post-op Pain Management:    Induction: Intravenous  PONV Risk Score and Plan: 2 and Dexamethasone, Ondansetron and Midazolam  Airway Management Planned: Natural Airway  Additional Equipment:   Intra-op Plan:   Post-operative Plan:   Informed Consent: I have reviewed the patients History and Physical, chart, labs and discussed the procedure including the risks, benefits and alternatives for the proposed anesthesia with the patient or authorized representative who has indicated his/her understanding and acceptance.     Dental advisory given  Plan Discussed with: CRNA  Anesthesia Plan Comments:        Anesthesia Quick Evaluation

## 2018-12-23 NOTE — Brief Op Note (Signed)
12/23/2018  6:46 PM  PATIENT:  Melissa Mcdowell  68 y.o. female  PRE-OPERATIVE DIAGNOSIS:  RETINAL DETACHMENT left eye  POST-OPERATIVE DIAGNOSIS:  RETINAL DETACHMENT left eye  PROCEDURE:  Procedure(s): REPAIR OF  RETINAL DETACHMENT LEFT EYE with vitrectomy, endolaser, air/fluid exchange and SF6 gas tamponade with posterior drain of subretinal fluid (Left)  SURGEON:  Surgeon(s) and Role:    * Jalene Mullet, MD - Primary  PHYSICIAN ASSISTANT:   ASSISTANTS: none   ANESTHESIA:   local and MAC  EBL:  minimal   BLOOD ADMINISTERED:none  DRAINS: none   LOCAL MEDICATIONS USED:  MARCAINE    and LIDOCAINE   SPECIMEN:  No Specimen  DISPOSITION OF SPECIMEN:  N/A  COUNTS:  YES  TOURNIQUET:  * No tourniquets in log *  DICTATION: .Note written in EPIC  PLAN OF CARE: Discharge to home after PACU  PATIENT DISPOSITION:  PACU - hemodynamically stable.   Delay start of Pharmacological VTE agent (>24hrs) due to surgical blood loss or risk of bleeding: not applicable

## 2018-12-23 NOTE — H&P (Signed)
  Date of examination:  12/23/18  Indication for surgery: Retinal detachment left eye  Pertinent past medical history:  Past Medical History:  Diagnosis Date  . Acid reflux    occasional  . Anemia    no current med.  . Arthritis    lower back, fingers  . Complication of anesthesia    states has a small mouth  . Dental crowns present   . Headache    sinus - 3-4 x/month  . Mucous cyst of finger 06/2012   left long finger    Pertinent ocular history:  Retinal detachment left eye  Pertinent family history:  Family History  Problem Relation Age of Onset  . Anesthesia problems Father        post-op N/V    General:  Healthy appearing patient in no distress.    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

## 2018-12-23 NOTE — H&P (Deleted)
  The note originally documented on this encounter has been moved the the encounter in which it belongs.  

## 2018-12-23 NOTE — Anesthesia Postprocedure Evaluation (Signed)
Anesthesia Post Note  Patient: Melissa Mcdowell  Procedure(s) Performed: REPAIR OF  RETINAL DETACHMENT LEFT EYE (Left Eye)     Patient location during evaluation: PACU Anesthesia Type: MAC Level of consciousness: awake and alert Pain management: pain level controlled Vital Signs Assessment: post-procedure vital signs reviewed and stable Respiratory status: spontaneous breathing, nonlabored ventilation, respiratory function stable and patient connected to nasal cannula oxygen Cardiovascular status: stable and blood pressure returned to baseline Postop Assessment: no apparent nausea or vomiting Anesthetic complications: no    Last Vitals:  Vitals:   12/23/18 1900 12/23/18 1915  BP: (!) 114/57 121/60  Pulse: (!) 58 (!) 58  Resp: 20 16  Temp:  36.6 C  SpO2: 98% 99%    Last Pain:  Vitals:   12/23/18 1915  TempSrc:   PainSc: 3                  Kristilyn Coltrane

## 2018-12-23 NOTE — Anesthesia Procedure Notes (Signed)
Procedure Name: MAC Date/Time: 12/23/2018 5:43 PM Performed by: Colin Benton, CRNA Pre-anesthesia Checklist: Patient identified, Emergency Drugs available, Suction available and Patient being monitored Patient Re-evaluated:Patient Re-evaluated prior to induction Oxygen Delivery Method: Nasal cannula Induction Type: IV induction Placement Confirmation: positive ETCO2 Dental Injury: Teeth and Oropharynx as per pre-operative assessment

## 2018-12-24 ENCOUNTER — Encounter (HOSPITAL_COMMUNITY): Payer: Self-pay | Admitting: Ophthalmology

## 2019-01-26 DIAGNOSIS — H35342 Macular cyst, hole, or pseudohole, left eye: Secondary | ICD-10-CM | POA: Diagnosis not present

## 2019-01-26 DIAGNOSIS — H35372 Puckering of macula, left eye: Secondary | ICD-10-CM | POA: Diagnosis not present

## 2019-01-26 DIAGNOSIS — Z961 Presence of intraocular lens: Secondary | ICD-10-CM | POA: Diagnosis not present

## 2019-01-26 DIAGNOSIS — H35352 Cystoid macular degeneration, left eye: Secondary | ICD-10-CM | POA: Diagnosis not present

## 2019-01-26 DIAGNOSIS — H33192 Other retinoschisis and retinal cysts, left eye: Secondary | ICD-10-CM | POA: Diagnosis not present

## 2019-02-16 DIAGNOSIS — H31092 Other chorioretinal scars, left eye: Secondary | ICD-10-CM | POA: Diagnosis not present

## 2019-02-16 DIAGNOSIS — Z961 Presence of intraocular lens: Secondary | ICD-10-CM | POA: Diagnosis not present

## 2019-02-16 DIAGNOSIS — H35371 Puckering of macula, right eye: Secondary | ICD-10-CM | POA: Diagnosis not present

## 2019-02-16 DIAGNOSIS — H35372 Puckering of macula, left eye: Secondary | ICD-10-CM | POA: Diagnosis not present

## 2019-02-16 DIAGNOSIS — H35342 Macular cyst, hole, or pseudohole, left eye: Secondary | ICD-10-CM | POA: Diagnosis not present

## 2019-02-16 DIAGNOSIS — H33102 Unspecified retinoschisis, left eye: Secondary | ICD-10-CM | POA: Diagnosis not present

## 2019-08-11 DIAGNOSIS — Z23 Encounter for immunization: Secondary | ICD-10-CM | POA: Diagnosis not present

## 2019-08-17 DIAGNOSIS — H31092 Other chorioretinal scars, left eye: Secondary | ICD-10-CM | POA: Diagnosis not present

## 2019-08-17 DIAGNOSIS — H33102 Unspecified retinoschisis, left eye: Secondary | ICD-10-CM | POA: Diagnosis not present

## 2019-08-17 DIAGNOSIS — H35371 Puckering of macula, right eye: Secondary | ICD-10-CM | POA: Diagnosis not present

## 2019-08-17 DIAGNOSIS — H35342 Macular cyst, hole, or pseudohole, left eye: Secondary | ICD-10-CM | POA: Diagnosis not present

## 2019-08-17 DIAGNOSIS — Z961 Presence of intraocular lens: Secondary | ICD-10-CM | POA: Diagnosis not present

## 2019-08-17 DIAGNOSIS — H35372 Puckering of macula, left eye: Secondary | ICD-10-CM | POA: Diagnosis not present

## 2019-10-10 DIAGNOSIS — R238 Other skin changes: Secondary | ICD-10-CM | POA: Diagnosis not present

## 2019-10-10 DIAGNOSIS — H1131 Conjunctival hemorrhage, right eye: Secondary | ICD-10-CM | POA: Diagnosis not present

## 2019-12-07 DIAGNOSIS — Z0000 Encounter for general adult medical examination without abnormal findings: Secondary | ICD-10-CM | POA: Diagnosis not present

## 2019-12-07 DIAGNOSIS — Z1389 Encounter for screening for other disorder: Secondary | ICD-10-CM | POA: Diagnosis not present

## 2019-12-07 DIAGNOSIS — Z79899 Other long term (current) drug therapy: Secondary | ICD-10-CM | POA: Diagnosis not present

## 2019-12-07 DIAGNOSIS — E7800 Pure hypercholesterolemia, unspecified: Secondary | ICD-10-CM | POA: Diagnosis not present

## 2019-12-09 ENCOUNTER — Ambulatory Visit: Payer: Medicare Other | Attending: Internal Medicine

## 2019-12-09 DIAGNOSIS — Z23 Encounter for immunization: Secondary | ICD-10-CM

## 2019-12-09 NOTE — Progress Notes (Signed)
   Covid-19 Vaccination Clinic  Name:  Melissa Mcdowell    MRN: [id number] DOB: [birth year]  12/09/2019  [name] was observed post Covid-19 immunization for 15 minutes without incidence. She was provided with Vaccine Information Sheet and instruction to access the V-Safe system.   [name] was instructed to call 911 with any severe reactions post vaccine: Marland Kitchen Difficulty breathing  . Swelling of your face and throat  . A fast heartbeat  . A bad rash all over your body  . Dizziness and weakness    Immunizations Administered    Name Date Dose VIS Date Route   Pfizer COVID-19 Vaccine 12/09/2019 10:07 AM 0.3 mL 10/29/2019 Intramuscular   Manufacturer: [employer], [employer]   Lot: [id number]   NDC: [address]-7

## 2019-12-14 DIAGNOSIS — Z0000 Encounter for general adult medical examination without abnormal findings: Secondary | ICD-10-CM | POA: Diagnosis not present

## 2019-12-30 ENCOUNTER — Ambulatory Visit: Payer: Medicare Other | Attending: Internal Medicine

## 2019-12-30 DIAGNOSIS — Z23 Encounter for immunization: Secondary | ICD-10-CM

## 2019-12-30 NOTE — Progress Notes (Signed)
   Covid-19 Vaccination Clinic  Name:  Melissa Mcdowell    MRN: [id number] DOB: Nov 12, 1951  12/30/2019  [name] was observed post Covid-19 immunization for 15 minutes without incidence. She was provided with Vaccine Information Sheet and instruction to access the V-Safe system.   [name] was instructed to call 911 with any severe reactions post vaccine: Marland Kitchen Difficulty breathing  . Swelling of your face and throat  . A fast heartbeat  . A bad rash all over your body  . Dizziness and weakness    Immunizations Administered    Name Date Dose VIS Date Route   Pfizer COVID-19 Vaccine 12/30/2019 10:43 AM 0.3 mL 10/29/2019 Intramuscular   Manufacturer: [employer], [employer]   Lot: [id number]   NDC: [address]-4

## 2020-02-15 DIAGNOSIS — H43812 Vitreous degeneration, left eye: Secondary | ICD-10-CM | POA: Diagnosis not present

## 2020-02-15 DIAGNOSIS — H35371 Puckering of macula, right eye: Secondary | ICD-10-CM | POA: Diagnosis not present

## 2020-02-15 DIAGNOSIS — H33192 Other retinoschisis and retinal cysts, left eye: Secondary | ICD-10-CM | POA: Diagnosis not present

## 2020-02-15 DIAGNOSIS — H35342 Macular cyst, hole, or pseudohole, left eye: Secondary | ICD-10-CM | POA: Diagnosis not present

## 2020-02-15 DIAGNOSIS — H35372 Puckering of macula, left eye: Secondary | ICD-10-CM | POA: Diagnosis not present

## 2020-08-09 DIAGNOSIS — Z23 Encounter for immunization: Secondary | ICD-10-CM | POA: Diagnosis not present

## 2020-08-18 DIAGNOSIS — H33102 Unspecified retinoschisis, left eye: Secondary | ICD-10-CM | POA: Diagnosis not present

## 2020-08-18 DIAGNOSIS — H35371 Puckering of macula, right eye: Secondary | ICD-10-CM | POA: Diagnosis not present

## 2020-08-18 DIAGNOSIS — H35342 Macular cyst, hole, or pseudohole, left eye: Secondary | ICD-10-CM | POA: Diagnosis not present

## 2020-08-18 DIAGNOSIS — H5315 Visual distortions of shape and size: Secondary | ICD-10-CM | POA: Diagnosis not present

## 2020-08-18 DIAGNOSIS — H33312 Horseshoe tear of retina without detachment, left eye: Secondary | ICD-10-CM | POA: Diagnosis not present

## 2020-08-18 DIAGNOSIS — H35372 Puckering of macula, left eye: Secondary | ICD-10-CM | POA: Diagnosis not present

## 2020-08-18 DIAGNOSIS — H31092 Other chorioretinal scars, left eye: Secondary | ICD-10-CM | POA: Diagnosis not present

## 2020-09-11 ENCOUNTER — Ambulatory Visit: Payer: Medicare Other | Attending: Internal Medicine

## 2020-09-11 ENCOUNTER — Other Ambulatory Visit (HOSPITAL_BASED_OUTPATIENT_CLINIC_OR_DEPARTMENT_OTHER): Payer: Self-pay | Admitting: Internal Medicine

## 2020-09-11 DIAGNOSIS — Z23 Encounter for immunization: Secondary | ICD-10-CM

## 2020-09-11 NOTE — Progress Notes (Signed)
   Covid-19 Vaccination Clinic  Name:  GEORGIANNE GRITZ    MRN: [id number] DOB: 19-May-1951  09/11/2020  Ms. Santoni was observed post Covid-19 immunization for 15 minutes without incident. She was provided with Vaccine Information Sheet and instruction to access the V-Safe system. Vaccinated by Theodis Sato.  Ms. Magnussen was instructed to call 911 with any severe reactions post vaccine: Marland Kitchen Difficulty breathing  . Swelling of face and throat  . A fast heartbeat  . A bad rash all over body  . Dizziness and weakness

## 2020-09-15 MED FILL — PFIZER-BIONTECH COVID-19 VA: 30 | 1 days supply | Qty: 0 | Fill #0

## 2021-02-19 DIAGNOSIS — H35342 Macular cyst, hole, or pseudohole, left eye: Secondary | ICD-10-CM | POA: Diagnosis not present

## 2021-02-19 DIAGNOSIS — H35373 Puckering of macula, bilateral: Secondary | ICD-10-CM | POA: Diagnosis not present

## 2021-03-22 DIAGNOSIS — H20012 Primary iridocyclitis, left eye: Secondary | ICD-10-CM | POA: Diagnosis not present

## 2021-03-22 DIAGNOSIS — Z961 Presence of intraocular lens: Secondary | ICD-10-CM | POA: Diagnosis not present

## 2021-03-26 DIAGNOSIS — H43392 Other vitreous opacities, left eye: Secondary | ICD-10-CM | POA: Diagnosis not present

## 2021-03-26 DIAGNOSIS — Z961 Presence of intraocular lens: Secondary | ICD-10-CM | POA: Diagnosis not present

## 2021-03-26 DIAGNOSIS — H20012 Primary iridocyclitis, left eye: Secondary | ICD-10-CM | POA: Diagnosis not present

## 2021-03-29 DIAGNOSIS — H5319 Other subjective visual disturbances: Secondary | ICD-10-CM | POA: Diagnosis not present

## 2021-03-29 DIAGNOSIS — Z961 Presence of intraocular lens: Secondary | ICD-10-CM | POA: Diagnosis not present

## 2021-03-29 DIAGNOSIS — H44112 Panuveitis, left eye: Secondary | ICD-10-CM | POA: Diagnosis not present

## 2021-03-29 DIAGNOSIS — H35352 Cystoid macular degeneration, left eye: Secondary | ICD-10-CM | POA: Diagnosis not present

## 2021-03-29 DIAGNOSIS — H35342 Macular cyst, hole, or pseudohole, left eye: Secondary | ICD-10-CM | POA: Diagnosis not present

## 2021-03-29 DIAGNOSIS — H35373 Puckering of macula, bilateral: Secondary | ICD-10-CM | POA: Diagnosis not present

## 2021-05-16 DIAGNOSIS — H35372 Puckering of macula, left eye: Secondary | ICD-10-CM | POA: Diagnosis not present

## 2021-05-16 DIAGNOSIS — H35342 Macular cyst, hole, or pseudohole, left eye: Secondary | ICD-10-CM | POA: Diagnosis not present

## 2021-05-16 DIAGNOSIS — H20042 Secondary noninfectious iridocyclitis, left eye: Secondary | ICD-10-CM | POA: Diagnosis not present

## 2021-05-16 DIAGNOSIS — H35352 Cystoid macular degeneration, left eye: Secondary | ICD-10-CM | POA: Diagnosis not present

## 2021-05-16 DIAGNOSIS — H26491 Other secondary cataract, right eye: Secondary | ICD-10-CM | POA: Diagnosis not present

## 2021-05-22 DIAGNOSIS — Z961 Presence of intraocular lens: Secondary | ICD-10-CM | POA: Diagnosis not present

## 2021-05-22 DIAGNOSIS — H35372 Puckering of macula, left eye: Secondary | ICD-10-CM | POA: Diagnosis not present

## 2021-05-22 DIAGNOSIS — Z9889 Other specified postprocedural states: Secondary | ICD-10-CM | POA: Diagnosis not present

## 2021-05-22 DIAGNOSIS — H26491 Other secondary cataract, right eye: Secondary | ICD-10-CM | POA: Diagnosis not present

## 2021-05-30 DIAGNOSIS — Z0000 Encounter for general adult medical examination without abnormal findings: Secondary | ICD-10-CM | POA: Diagnosis not present

## 2021-05-30 DIAGNOSIS — Z79899 Other long term (current) drug therapy: Secondary | ICD-10-CM | POA: Diagnosis not present

## 2021-05-30 DIAGNOSIS — E7800 Pure hypercholesterolemia, unspecified: Secondary | ICD-10-CM | POA: Diagnosis not present

## 2021-05-30 DIAGNOSIS — Z1389 Encounter for screening for other disorder: Secondary | ICD-10-CM | POA: Diagnosis not present

## 2021-06-05 DIAGNOSIS — Z0000 Encounter for general adult medical examination without abnormal findings: Secondary | ICD-10-CM | POA: Diagnosis not present

## 2021-06-13 DIAGNOSIS — H35352 Cystoid macular degeneration, left eye: Secondary | ICD-10-CM | POA: Diagnosis not present

## 2021-06-13 DIAGNOSIS — H338 Other retinal detachments: Secondary | ICD-10-CM | POA: Diagnosis not present

## 2021-06-13 DIAGNOSIS — H35372 Puckering of macula, left eye: Secondary | ICD-10-CM | POA: Diagnosis not present

## 2021-06-13 DIAGNOSIS — H35342 Macular cyst, hole, or pseudohole, left eye: Secondary | ICD-10-CM | POA: Diagnosis not present

## 2021-06-13 DIAGNOSIS — Z961 Presence of intraocular lens: Secondary | ICD-10-CM | POA: Diagnosis not present

## 2021-06-13 DIAGNOSIS — H31092 Other chorioretinal scars, left eye: Secondary | ICD-10-CM | POA: Diagnosis not present

## 2021-06-20 DIAGNOSIS — H5211 Myopia, right eye: Secondary | ICD-10-CM | POA: Diagnosis not present

## 2021-06-20 DIAGNOSIS — H52223 Regular astigmatism, bilateral: Secondary | ICD-10-CM | POA: Diagnosis not present

## 2021-06-20 DIAGNOSIS — H524 Presbyopia: Secondary | ICD-10-CM | POA: Diagnosis not present

## 2021-06-20 DIAGNOSIS — H04123 Dry eye syndrome of bilateral lacrimal glands: Secondary | ICD-10-CM | POA: Diagnosis not present

## 2021-06-20 DIAGNOSIS — Z961 Presence of intraocular lens: Secondary | ICD-10-CM | POA: Diagnosis not present

## 2021-06-20 DIAGNOSIS — H35373 Puckering of macula, bilateral: Secondary | ICD-10-CM | POA: Diagnosis not present

## 2021-08-14 ENCOUNTER — Ambulatory Visit (INDEPENDENT_AMBULATORY_CARE_PROVIDER_SITE_OTHER): Payer: Medicare Other

## 2021-08-14 ENCOUNTER — Ambulatory Visit (INDEPENDENT_AMBULATORY_CARE_PROVIDER_SITE_OTHER): Payer: Medicare Other | Admitting: Podiatry

## 2021-08-14 ENCOUNTER — Telehealth: Payer: Self-pay | Admitting: *Deleted

## 2021-08-14 ENCOUNTER — Other Ambulatory Visit: Payer: Self-pay

## 2021-08-14 DIAGNOSIS — M21611 Bunion of right foot: Secondary | ICD-10-CM

## 2021-08-14 DIAGNOSIS — L600 Ingrowing nail: Secondary | ICD-10-CM | POA: Diagnosis not present

## 2021-08-14 DIAGNOSIS — M21612 Bunion of left foot: Secondary | ICD-10-CM

## 2021-08-14 DIAGNOSIS — M778 Other enthesopathies, not elsewhere classified: Secondary | ICD-10-CM | POA: Diagnosis not present

## 2021-08-14 DIAGNOSIS — M79671 Pain in right foot: Secondary | ICD-10-CM

## 2021-08-14 DIAGNOSIS — M79672 Pain in left foot: Secondary | ICD-10-CM

## 2021-08-14 MED ORDER — DOXYCYCLINE HYCLATE 100 MG PO TABS: 100.0000 mg | ORAL_TABLET | Freq: Two times a day (BID) | ORAL | 0 refills | Status: DC

## 2021-08-14 MED ORDER — MELOXICAM 7.5 MG PO TABS: 7.5000 mg | ORAL_TABLET | Freq: Every day | ORAL | 0 refills | Status: DC

## 2021-08-14 NOTE — Patient Instructions (Signed)

## 2021-08-14 NOTE — Telephone Encounter (Signed)
Patient is calling for the status of a medication that was supposed to be sent to pharmacy,called them and not there. Please advise.

## 2021-08-14 NOTE — Telephone Encounter (Signed)
Patient called the office again concerned about her  medication she states that she called the pharmacy and it is not there.    Please advise.

## 2021-08-15 NOTE — Telephone Encounter (Signed)
I called the patient to let her know that her RX has been sent.  

## 2021-08-16 NOTE — Progress Notes (Signed)
Subjective:   Patient ID: Melissa Mcdowell, female   DOB: 70 y.o.   MRN: [id number]   HPI 70 year old female presents the office today for multiple concerns.  Her main concern is an ingrown toenail on the right big toe, lateral aspect.  Is tender.  She denies any swelling or redness but it hurts when she puts pressure.  Also that she gets bunions in both feet which are causing discomfort with shoes and she is having difficulty finding shoes that fit.  She also states that she gets some discomfort at the top of her foot as well.  Minimal swelling.  No recent injury or falls.  No recent treatment.   Review of Systems  All other systems reviewed and are negative.  Past Medical History:  Diagnosis Date   Acid reflux    occasional   Anemia    no current med.   Arthritis    lower back, fingers   Complication of anesthesia    states has a small mouth   Dental crowns present    Headache(784.0)    sinus - 3-4 x/month   Mucous cyst of finger 06/2012   left long finger    Past Surgical History:  Procedure Laterality Date   ABDOMINAL HYSTERECTOMY     complete   APPENDECTOMY     CESAREAN SECTION     ECTOPIC PREGNANCY SURGERY     MASS EXCISION  07/10/2012   Procedure: MINOR EXCISION OF MASS;  Surgeon: Wyn Forster., MD;  Location:  [facility];  Service: Orthopedics;  Laterality: Left;   OOPHORECTOMY     REPAIR OF COMPLEX TRACTION RETINAL DETACHMENT Left 12/23/2018   Procedure: REPAIR OF  RETINAL DETACHMENT LEFT EYE;  Surgeon: Carmela Rima, MD;  Location: [facility] OR;  Service: Ophthalmology;  Laterality: Left;     Current Outpatient Medications:    doxycycline (VIBRA-TABS) 100 MG tablet, Take 1 tablet (100 mg total) by mouth 2 (two) times daily., Disp: 14 tablet, Rfl: 0   meloxicam (MOBIC) 7.5 MG tablet, Take 1 tablet (7.5 mg total) by mouth daily., Disp: 14 tablet, Rfl: 0   calcium carbonate (OS-CAL) 600 MG TABS, Take 600 mg by mouth 2 (two) times daily with a meal.,  Disp: , Rfl:    cholecalciferol (VITAMIN D) 1000 UNITS tablet, Take 5,000 Units by mouth daily., Disp: , Rfl:    cimetidine (TAGAMET) 200 MG tablet, Take 200 mg by mouth 4 (four) times daily., Disp: , Rfl:    COVID-19 mRNA vaccine, Pfizer, 30 MCG/0.3ML injection, INJECT AS DIRECTED, Disp: .3 mL, Rfl: 0   estradiol (ESTRACE) 0.5 MG tablet, Take 0.5 mg by mouth daily., Disp: , Rfl:    ezetimibe (ZETIA) 10 MG tablet, Take 10 mg by mouth daily., Disp: , Rfl:    Multiple Vitamin (MULTIVITAMIN) tablet, Take 1 tablet by mouth daily., Disp: , Rfl:    naproxen sodium (ANAPROX) 220 MG tablet, Take 220 mg by mouth 2 (two) times daily with a meal., Disp: , Rfl:    vitamin C (ASCORBIC ACID) 250 MG tablet, Take 250 mg by mouth daily., Disp: , Rfl:    vitamin E 100 UNIT capsule, Take 100 Units by mouth daily., Disp: , Rfl:   Allergies  Allergen Reactions   Sulfa Antibiotics Nausea And Vomiting    Sister has anaphylaxis. Throat swelling, stops breathing, heart stops   Amoxicillin Nausea And Vomiting   Adhesive [Tape] Other (See Comments)    SKIN FLAKES OFF  Morphine And Related Other (See Comments)    HYPERACTIVITY AND HAS NO RELIEF OF PAIN         Objective:  Physical Exam  General: AAO x3, NAD  Dermatological: Incurvation of the right hallux lateral nail border with localized edema but there is no significant erythema, ascending cellulitis.  No drainage or pus or any signs of infection.  No open lesions.  Vascular: Dorsalis Pedis artery and Posterior Tibial artery pedal pulses are 2/4 bilateral with immedate capillary fill time.  There is no pain with calf compression, swelling, warmth, erythema.   Neruologic: Grossly intact via light touch bilateral.   Musculoskeletal: Tenderness on the right lateral hallux nail border from the ingrown toenail.  Moderate bunions are present bilaterally with mild tenderness on the medial first metatarsal.  On the dorsal aspect of right foot there is localized  edema along the midfoot area.  Flexor, extensor tendons appear to be intact.  MMT 5/5.  No specific area of pinpoint tenderness.   Gait: Unassisted, Nonantalgic.       Assessment:   70 year old female with right lateral hallux ingrown toenail; bunion; capsulitis foot     Plan:  -Treatment options discussed including all alternatives, risks, and complications -Etiology of symptoms were discussed -X-rays were obtained and reviewed with the patient.  Moderate bunions are present.  Minimal arthritic changes present the midfoot.  No evidence of acute fracture. -At this time we discussed conservative treatment for the nail versus surgical excision.  The patient is requesting partial nail removal with chemical matricectomy to the symptomatic portion of the nail. Risks and complications were discussed with the patient for which they understand and written consent was obtained. Under sterile conditions a total of 3 mL of a mixture of 2% lidocaine plain and 0.5% Marcaine plain was infiltrated in a hallux block fashion. Once anesthetized, the skin was prepped in sterile fashion. A tourniquet was then applied. Next the right lateral aspect of hallux nail border was then sharply excised making sure to remove the entire offending nail border. Once the nails were ensured to be removed area was debrided and the underlying skin was intact. There is no purulence identified in the procedure. Next phenol was then applied under standard conditions and copiously irrigated.  Antibiotic ointment was applied. A dry sterile dressing was applied. After application of the dressing the tourniquet was removed and there is found to be an immediate capillary refill time to the digit. The patient tolerated the procedure well any complications. Post procedure instructions were discussed the patient for which he verbally understood. Follow-up in one week for nail check or sooner if any problems are to arise. Discussed signs/symptoms of  infection and directed to call the office immediately should any occur or go directly to the emergency room. In the meantime, encouraged to call the office with any questions, concerns, changes symptoms. Keflex prescribed -For the bunions we discussed shoe modifications, offloading and padding.  In the future consider surgical intervention if needed. -For the right midfoot we discussed shoes with good arch support.  Prescribed meloxicam.  Return in about 2 weeks (around 08/28/2021).  Vivi Barrack DPM

## 2021-08-28 ENCOUNTER — Ambulatory Visit (INDEPENDENT_AMBULATORY_CARE_PROVIDER_SITE_OTHER): Payer: Medicare Other | Admitting: Podiatry

## 2021-08-28 ENCOUNTER — Encounter: Payer: Self-pay | Admitting: Podiatry

## 2021-08-28 ENCOUNTER — Other Ambulatory Visit: Payer: Self-pay

## 2021-08-28 DIAGNOSIS — L600 Ingrowing nail: Secondary | ICD-10-CM

## 2021-08-28 NOTE — Patient Instructions (Signed)

## 2021-09-01 NOTE — Progress Notes (Signed)
Subjective: Melissa Mcdowell is a 70 y.o. female returns to office today for follow up evaluation after having right hallux partial nail avulsion performed. Patient has been soaking using Epsom salts and applying topical Polysporin covered with bandage daily.  She states that there is no drainage.  Pain is improved.  Patient denies fevers, chills, nausea, vomiting. Denies any calf pain, chest pain, SOB.   Objective:  General: Well developed, nourished, in no acute distress, alert and oriented x3   Dermatology: Skin is warm, dry and supple bilateral.  Right hallux nail border appears to be clean, dry, with mild granular tissue and surrounding scab.  Faint erythema at the base of the nail corner but there is no ascending cellulitis there is no drainage or pus.   Neurovascular status: Intact.   Musculoskeletal: Decreased tenderness to palpation of the right hallux nail fold. Muscular strength within normal limits bilateral.   Assesement and Plan: S/p partial nail avulsion, doing well.   -Continue soaking in epsom salts twice a day followed by antibiotic ointment and a band-aid. Can leave uncovered at night. Continue this until completely healed.  -If the area has not healed in 2 weeks, call the office for follow-up appointment, or sooner if any problems arise.  -Monitor for any signs/symptoms of infection. Call the office immediately if any occur or go directly to the emergency room. Call with any questions/concerns.  Ovid Curd, DPM

## 2021-09-25 DIAGNOSIS — Z23 Encounter for immunization: Secondary | ICD-10-CM | POA: Diagnosis not present

## 2021-12-13 DIAGNOSIS — H5315 Visual distortions of shape and size: Secondary | ICD-10-CM | POA: Diagnosis not present

## 2021-12-13 DIAGNOSIS — H338 Other retinal detachments: Secondary | ICD-10-CM | POA: Diagnosis not present

## 2021-12-13 DIAGNOSIS — H35342 Macular cyst, hole, or pseudohole, left eye: Secondary | ICD-10-CM | POA: Diagnosis not present

## 2021-12-13 DIAGNOSIS — H43391 Other vitreous opacities, right eye: Secondary | ICD-10-CM | POA: Diagnosis not present

## 2021-12-13 DIAGNOSIS — Z961 Presence of intraocular lens: Secondary | ICD-10-CM | POA: Diagnosis not present

## 2021-12-13 DIAGNOSIS — H35372 Puckering of macula, left eye: Secondary | ICD-10-CM | POA: Diagnosis not present

## 2021-12-18 DIAGNOSIS — Z1231 Encounter for screening mammogram for malignant neoplasm of breast: Secondary | ICD-10-CM | POA: Diagnosis not present

## 2022-06-10 DIAGNOSIS — Z79899 Other long term (current) drug therapy: Secondary | ICD-10-CM | POA: Diagnosis not present

## 2022-06-10 DIAGNOSIS — E7800 Pure hypercholesterolemia, unspecified: Secondary | ICD-10-CM | POA: Diagnosis not present

## 2022-06-10 DIAGNOSIS — Z0000 Encounter for general adult medical examination without abnormal findings: Secondary | ICD-10-CM | POA: Diagnosis not present

## 2022-06-13 DIAGNOSIS — H338 Other retinal detachments: Secondary | ICD-10-CM | POA: Diagnosis not present

## 2022-06-13 DIAGNOSIS — H43391 Other vitreous opacities, right eye: Secondary | ICD-10-CM | POA: Diagnosis not present

## 2022-06-13 DIAGNOSIS — H59812 Chorioretinal scars after surgery for detachment, left eye: Secondary | ICD-10-CM | POA: Diagnosis not present

## 2022-06-13 DIAGNOSIS — H35373 Puckering of macula, bilateral: Secondary | ICD-10-CM | POA: Diagnosis not present

## 2022-06-13 DIAGNOSIS — Z961 Presence of intraocular lens: Secondary | ICD-10-CM | POA: Diagnosis not present

## 2022-06-13 DIAGNOSIS — H35342 Macular cyst, hole, or pseudohole, left eye: Secondary | ICD-10-CM | POA: Diagnosis not present

## 2022-06-17 DIAGNOSIS — Z0000 Encounter for general adult medical examination without abnormal findings: Secondary | ICD-10-CM | POA: Diagnosis not present

## 2022-06-20 DIAGNOSIS — H5211 Myopia, right eye: Secondary | ICD-10-CM | POA: Diagnosis not present

## 2022-06-20 DIAGNOSIS — Z961 Presence of intraocular lens: Secondary | ICD-10-CM | POA: Diagnosis not present

## 2022-06-20 DIAGNOSIS — H35373 Puckering of macula, bilateral: Secondary | ICD-10-CM | POA: Diagnosis not present

## 2022-06-20 DIAGNOSIS — H04123 Dry eye syndrome of bilateral lacrimal glands: Secondary | ICD-10-CM | POA: Diagnosis not present

## 2022-06-20 DIAGNOSIS — H524 Presbyopia: Secondary | ICD-10-CM | POA: Diagnosis not present

## 2022-06-20 DIAGNOSIS — H52223 Regular astigmatism, bilateral: Secondary | ICD-10-CM | POA: Diagnosis not present

## 2022-06-20 DIAGNOSIS — H59812 Chorioretinal scars after surgery for detachment, left eye: Secondary | ICD-10-CM | POA: Diagnosis not present

## 2022-06-28 DIAGNOSIS — Z23 Encounter for immunization: Secondary | ICD-10-CM | POA: Diagnosis not present

## 2023-05-26 ENCOUNTER — Ambulatory Visit (INDEPENDENT_AMBULATORY_CARE_PROVIDER_SITE_OTHER): Payer: Medicare Other | Admitting: Family Medicine

## 2023-05-26 ENCOUNTER — Encounter: Payer: Self-pay | Admitting: Family Medicine

## 2023-05-26 VITALS — BP 150/71 | HR 66 | Ht 63.5 in | Wt 170.9 lb

## 2023-05-26 DIAGNOSIS — E782 Mixed hyperlipidemia: Secondary | ICD-10-CM | POA: Diagnosis not present

## 2023-05-26 DIAGNOSIS — R030 Elevated blood-pressure reading, without diagnosis of hypertension: Secondary | ICD-10-CM | POA: Diagnosis not present

## 2023-05-26 DIAGNOSIS — F5101 Primary insomnia: Secondary | ICD-10-CM | POA: Diagnosis not present

## 2023-05-26 NOTE — Assessment & Plan Note (Signed)
-   BP elevated today however she just had news that their son lost his job - if elevated at next visit, will need to consider medical therapy - for now, can focus on diet and exercise

## 2023-05-26 NOTE — Assessment & Plan Note (Signed)
-   pt has trouble falling back to sleep. Discussed sleep hygiene and resetting sleep cycle. Recommended trying melatonin 3mg  first

## 2023-05-26 NOTE — Progress Notes (Signed)
New patient visit   Patient: Melissa Mcdowell   DOB: [birth year]   72 y.o. Female  MRN: [id number] Visit Date: 05/26/2023  Today's healthcare provider: Charlton Amor, DO   Chief Complaint  Patient presents with   Establish Care    SUBJECTIVE    Chief Complaint  Patient presents with   Establish Care   HPI   Pt presents to establish care.   Pmh of high cholesterol - she is allergic to statins  - currently on zetia- has side effects of loose stols   Has family history  CAD and stroke at 25 mother died   Arthritis  - has a hx of a fracture pelvis in 2002  Insomnia - has trouble going back to sleep  Review of Systems  Constitutional:  Negative for activity change, fatigue and fever.  Respiratory:  Negative for cough and shortness of breath.   Cardiovascular:  Negative for chest pain.  Gastrointestinal:  Negative for abdominal pain.  Genitourinary:  Negative for difficulty urinating.       Current Meds  Medication Sig   estradiol (ESTRACE) 0.5 MG tablet Take 0.5 mg by mouth daily.   ezetimibe (ZETIA) 10 MG tablet Take 10 mg by mouth daily.   Fluticasone Propionate (KLS ALLER-FLO NA) Place into the nose.   loratadine (CLARITIN) 10 MG tablet Take 10 mg by mouth daily.    OBJECTIVE    BP (!) 150/71 (BP Location: Right Arm, Patient Position: Sitting, Cuff Size: Normal)   Pulse 66   Ht 5' 3.5" (1.613 m)   Wt 170 lb 14.4 oz (77.5 kg)   SpO2 98%   BMI 29.80 kg/m   Physical Exam Vitals and nursing note reviewed.  Constitutional:      General: She is not in acute distress.    Appearance: Normal appearance.  HENT:     Head: Normocephalic and atraumatic.     Right Ear: External ear normal.     Left Ear: External ear normal.     Nose: Nose normal.  Eyes:     Conjunctiva/sclera: Conjunctivae normal.  Cardiovascular:     Rate and Rhythm: Normal rate and regular rhythm.  Pulmonary:     Effort: Pulmonary effort is normal.     Breath sounds: Normal  breath sounds.  Neurological:     General: No focal deficit present.     Mental Status: She is alert and oriented to person, place, and time.  Psychiatric:        Mood and Affect: Mood normal.        Behavior: Behavior normal.        Thought Content: Thought content normal.        Judgment: Judgment normal.          ASSESSMENT & PLAN    Problem List Items Addressed This Visit       Other   Primary insomnia    - pt has trouble falling back to sleep. Discussed sleep hygiene and resetting sleep cycle. Recommended trying melatonin 3mg  first       Elevated blood pressure reading - Primary    - BP elevated today however she just had news that their son lost his job - if elevated at next visit, will need to consider medical therapy - for now, can focus on diet and exercise      Other Visit Diagnoses     Mixed hyperlipidemia       Relevant Orders  Lipid panel       Return in about 1 year (around 05/25/2024).      No orders of the defined types were placed in this encounter.   Orders Placed This Encounter  Procedures   Lipid panel    Order Specific Question:   Has the patient fasted?    Answer:   No    Order Specific Question:   Release to patient    Answer:   Immediate     Charlton Amor, DO  [facility] [phone] (phone) [phone] (fax)  [facility]

## 2023-05-27 LAB — LIPID PANEL
Cholesterol: 261 mg/dL — ABNORMAL HIGH (ref <200)
HDL: 90 mg/dL (ref >=50)
LDL Cholesterol (Calc): 155 mg/dL (calc) — ABNORMAL HIGH
Non-HDL Cholesterol (Calc): 171 mg/dL (calc) — ABNORMAL HIGH (ref <130)
Total CHOL/HDL Ratio: 2.9 (calc) (ref <5.0)
Triglycerides: 68 mg/dL (ref <150)

## 2023-06-18 ENCOUNTER — Other Ambulatory Visit: Payer: Self-pay

## 2023-06-18 DIAGNOSIS — Z1211 Encounter for screening for malignant neoplasm of colon: Secondary | ICD-10-CM

## 2023-06-18 NOTE — Progress Notes (Signed)
Ordered pt a cologuard test. Roselyn Reef, CMA

## 2023-08-11 ENCOUNTER — Encounter (HOSPITAL_COMMUNITY): Payer: Self-pay

## 2023-08-11 ENCOUNTER — Inpatient Hospital Stay (HOSPITAL_COMMUNITY): Payer: Medicare Other

## 2023-08-11 ENCOUNTER — Inpatient Hospital Stay (HOSPITAL_COMMUNITY)
Admission: EM | Admit: 2023-08-11 | Discharge: 2023-08-14 | DRG: 522 | Disposition: A | Discharge status: Home Health | Payer: Medicare Other | Attending: Family Medicine | Admitting: Family Medicine

## 2023-08-11 ENCOUNTER — Emergency Department (HOSPITAL_COMMUNITY): Payer: Medicare Other

## 2023-08-11 ENCOUNTER — Other Ambulatory Visit: Payer: Self-pay

## 2023-08-11 DIAGNOSIS — W010XXA Fall on same level from slipping, tripping and stumbling without subsequent striking against object, initial encounter: Secondary | ICD-10-CM | POA: Diagnosis present

## 2023-08-11 DIAGNOSIS — Z90710 Acquired absence of both cervix and uterus: Secondary | ICD-10-CM

## 2023-08-11 DIAGNOSIS — S42201A Unspecified fracture of upper end of right humerus, initial encounter for closed fracture: Principal | ICD-10-CM | POA: Diagnosis present

## 2023-08-11 DIAGNOSIS — Z820 Family history of epilepsy and other diseases of the nervous system: Secondary | ICD-10-CM | POA: Diagnosis not present

## 2023-08-11 DIAGNOSIS — Z885 Allergy status to narcotic agent status: Secondary | ICD-10-CM | POA: Diagnosis not present

## 2023-08-11 DIAGNOSIS — Y92009 Unspecified place in unspecified non-institutional (private) residence as the place of occurrence of the external cause: Secondary | ICD-10-CM | POA: Diagnosis not present

## 2023-08-11 DIAGNOSIS — D6959 Other secondary thrombocytopenia: Secondary | ICD-10-CM | POA: Diagnosis not present

## 2023-08-11 DIAGNOSIS — Z9841 Cataract extraction status, right eye: Secondary | ICD-10-CM

## 2023-08-11 DIAGNOSIS — K219 Gastro-esophageal reflux disease without esophagitis: Secondary | ICD-10-CM | POA: Diagnosis present

## 2023-08-11 DIAGNOSIS — R739 Hyperglycemia, unspecified: Secondary | ICD-10-CM | POA: Diagnosis present

## 2023-08-11 DIAGNOSIS — S42211A Unspecified displaced fracture of surgical neck of right humerus, initial encounter for closed fracture: Secondary | ICD-10-CM | POA: Diagnosis present

## 2023-08-11 DIAGNOSIS — Z833 Family history of diabetes mellitus: Secondary | ICD-10-CM | POA: Diagnosis not present

## 2023-08-11 DIAGNOSIS — Z882 Allergy status to sulfonamides status: Secondary | ICD-10-CM | POA: Diagnosis not present

## 2023-08-11 DIAGNOSIS — R197 Diarrhea, unspecified: Secondary | ICD-10-CM | POA: Diagnosis not present

## 2023-08-11 DIAGNOSIS — Z96641 Presence of right artificial hip joint: Secondary | ICD-10-CM

## 2023-08-11 DIAGNOSIS — S72001A Fracture of unspecified part of neck of right femur, initial encounter for closed fracture: Secondary | ICD-10-CM | POA: Diagnosis present

## 2023-08-11 DIAGNOSIS — Z823 Family history of stroke: Secondary | ICD-10-CM

## 2023-08-11 DIAGNOSIS — S42151A Displaced fracture of neck of scapula, right shoulder, initial encounter for closed fracture: Secondary | ICD-10-CM | POA: Diagnosis present

## 2023-08-11 DIAGNOSIS — F439 Reaction to severe stress, unspecified: Secondary | ICD-10-CM | POA: Diagnosis present

## 2023-08-11 DIAGNOSIS — Z9842 Cataract extraction status, left eye: Secondary | ICD-10-CM | POA: Diagnosis not present

## 2023-08-11 DIAGNOSIS — Z8249 Family history of ischemic heart disease and other diseases of the circulatory system: Secondary | ICD-10-CM | POA: Diagnosis not present

## 2023-08-11 DIAGNOSIS — R7303 Prediabetes: Secondary | ICD-10-CM | POA: Diagnosis present

## 2023-08-11 DIAGNOSIS — E7800 Pure hypercholesterolemia, unspecified: Secondary | ICD-10-CM | POA: Diagnosis present

## 2023-08-11 DIAGNOSIS — S42141A Displaced fracture of glenoid cavity of scapula, right shoulder, initial encounter for closed fracture: Secondary | ICD-10-CM

## 2023-08-11 DIAGNOSIS — S42301A Unspecified fracture of shaft of humerus, right arm, initial encounter for closed fracture: Secondary | ICD-10-CM | POA: Diagnosis present

## 2023-08-11 DIAGNOSIS — Z880 Allergy status to penicillin: Secondary | ICD-10-CM | POA: Diagnosis not present

## 2023-08-11 DIAGNOSIS — Z79899 Other long term (current) drug therapy: Secondary | ICD-10-CM | POA: Diagnosis not present

## 2023-08-11 LAB — CBC
HCT: 38.3 % (ref 36.0–46.0)
Hemoglobin: 12.6 g/dL (ref 12.0–15.0)
MCH: 32.1 pg (ref 26.0–34.0)
MCHC: 32.9 g/dL (ref 30.0–36.0)
MCV: 97.5 fL (ref 80.0–100.0)
Platelets: 168 10*3/uL (ref 150–400)
RBC: 3.93 MIL/uL (ref 3.87–5.11)
RDW: 14.4 % (ref 11.5–15.5)
WBC: 10.1 10*3/uL (ref 4.0–10.5)
nRBC: 0 % (ref 0.0–0.2)

## 2023-08-11 LAB — BASIC METABOLIC PANEL
Anion gap: 11 (ref 5–15)
BUN: 15 mg/dL (ref 8–23)
CO2: 23 mmol/L (ref 22–32)
Calcium: 8.7 mg/dL — ABNORMAL LOW (ref 8.9–10.3)
Chloride: 106 mmol/L (ref 98–111)
Creatinine, Ser: 0.54 mg/dL (ref 0.44–1.00)
GFR, Estimated: >60 mL/min (ref >60)
Glucose, Bld: 106 mg/dL — ABNORMAL HIGH (ref 70–99)
Potassium: 3.9 mmol/L (ref 3.5–5.1)
Sodium: 140 mmol/L (ref 135–145)

## 2023-08-11 LAB — SURGICAL PCR SCREEN
MRSA, PCR: NEGATIVE
Staphylococcus aureus: NEGATIVE

## 2023-08-11 MED ORDER — OXYCODONE HCL 5 MG PO TABS
5.0000 mg | ORAL_TABLET | ORAL | Status: DC | PRN
  Administered 2023-08-11 (×2): 5 mg via ORAL
  Filled 2023-08-11 (×2): qty 1

## 2023-08-11 MED ORDER — OXYCODONE HCL 5 MG PO TABS: 5.0000 mg | ORAL_TABLET | Freq: Four times a day (QID) | ORAL | 0 refills | Status: DC | PRN

## 2023-08-11 MED ORDER — FENTANYL CITRATE PF 50 MCG/ML IJ SOSY
50.0000 ug | PREFILLED_SYRINGE | Freq: Once | INTRAMUSCULAR | Status: AC
  Administered 2023-08-11: 50 ug via INTRAVENOUS
  Filled 2023-08-11: qty 1

## 2023-08-11 MED ORDER — OXYCODONE-ACETAMINOPHEN 5-325 MG PO TABS
1.0000 | ORAL_TABLET | Freq: Once | ORAL | Status: AC
  Administered 2023-08-11: 1 via ORAL
  Filled 2023-08-11: qty 1

## 2023-08-11 MED ORDER — KETOROLAC TROMETHAMINE 30 MG/ML IJ SOLN
30.0000 mg | Freq: Once | INTRAMUSCULAR | Status: AC
  Administered 2023-08-11: 30 mg via INTRAVENOUS
  Filled 2023-08-11: qty 1

## 2023-08-11 MED ORDER — ONDANSETRON HCL 4 MG/2ML IJ SOLN
4.0000 mg | Freq: Four times a day (QID) | INTRAMUSCULAR | Status: DC | PRN
  Administered 2023-08-12 (×2): 4 mg via INTRAVENOUS
  Filled 2023-08-11: qty 2

## 2023-08-11 MED ORDER — IBUPROFEN 600 MG PO TABS: 600.0000 mg | ORAL_TABLET | Freq: Four times a day (QID) | ORAL | 0 refills | Status: AC | PRN

## 2023-08-11 MED ORDER — HYDROMORPHONE HCL 1 MG/ML IJ SOLN
0.5000 mg | INTRAMUSCULAR | Status: DC | PRN
  Administered 2023-08-11 – 2023-08-12 (×6): 0.5 mg via INTRAVENOUS
  Filled 2023-08-11: qty 0.5
  Filled 2023-08-11: qty 1
  Filled 2023-08-11 (×4): qty 0.5

## 2023-08-11 MED ORDER — SENNOSIDES-DOCUSATE SODIUM 8.6-50 MG PO TABS
1.0000 | ORAL_TABLET | Freq: Two times a day (BID) | ORAL | Status: DC
  Administered 2023-08-11 – 2023-08-13 (×3): 1 via ORAL
  Filled 2023-08-11 (×3): qty 1

## 2023-08-11 MED ORDER — ACETAMINOPHEN 650 MG RE SUPP: 650.0000 mg | Freq: Four times a day (QID) | RECTAL | Status: DC | PRN

## 2023-08-11 MED ORDER — ONDANSETRON HCL 4 MG PO TABS: 4.0000 mg | ORAL_TABLET | Freq: Four times a day (QID) | ORAL | Status: DC | PRN

## 2023-08-11 MED ORDER — ACETAMINOPHEN 325 MG PO TABS: 650.0000 mg | ORAL_TABLET | Freq: Four times a day (QID) | ORAL | 0 refills | Status: DC | PRN

## 2023-08-11 MED ORDER — MUPIROCIN 2 % EX OINT
1.0000 | TOPICAL_OINTMENT | Freq: Two times a day (BID) | CUTANEOUS | Status: DC
  Administered 2023-08-11 – 2023-08-14 (×4): 1 via NASAL
  Filled 2023-08-11: qty 22

## 2023-08-11 MED ORDER — ACETAMINOPHEN 325 MG PO TABS
650.0000 mg | ORAL_TABLET | Freq: Four times a day (QID) | ORAL | Status: DC | PRN
  Administered 2023-08-11: 650 mg via ORAL
  Filled 2023-08-11: qty 2

## 2023-08-11 NOTE — Consult Note (Signed)
ORTHOPAEDIC CONSULTATION  REQUESTING PHYSICIAN: Bobette Mo, MD  PCP:  Charlton Amor, DO  Chief Complaint: Ground level fall with right hip pain, right shoulder pain   HPI: Melissa Mcdowell is a 72 y.o. female presenting to ED with pain in her right shoulder. Slipped in kitchen and had FOOSH type fall, felt pain in her right shoulder with limited mobility afterwards. No prior hx of surgeries or dislocation of the shoulder. She is right handed. No other injuries reported.   She was found to have a right femoral neck fracture and right humeral neck fracture.   Orthopaedics was consulted for evaluation and management.  Today, she is resting in a stretcher in the ER with her husband at the bedside. She is in good spirits, and explained her injury this morning. She does not notice any pain or injuries aside from the right hip and right shoulder. She lives with her husband. She normally ambulates without assistive devices. She is not diabetic. She does not take blood thinners.   Past Medical History:  Diagnosis Date   Acid reflux    occasional   Anemia    no current med.   Arthritis    lower back, fingers   Complication of anesthesia    states has a small mouth   Dental crowns present    Headache(784.0)    sinus - 3-4 x/month   Hypercholesteremia    Mucous cyst of finger 06/18/2012   left long finger   Past Surgical History:  Procedure Laterality Date   ABDOMINAL HYSTERECTOMY     complete   APPENDECTOMY     CATARACT EXTRACTION Bilateral    CESAREAN SECTION     ECTOPIC PREGNANCY SURGERY     MASS EXCISION  07/10/2012   Procedure: MINOR EXCISION OF MASS;  Surgeon: Wyn Forster., MD;  Location: [facility];  Service: Orthopedics;  Laterality: Left;   OOPHORECTOMY     REPAIR OF COMPLEX TRACTION RETINAL DETACHMENT Left 12/23/2018   Procedure: REPAIR OF  RETINAL DETACHMENT LEFT EYE;  Surgeon: Carmela Rima, MD;  Location: [facility];  Service:  Ophthalmology;  Laterality: Left;   Social History   Socioeconomic History   Marital status: Married    Spouse name: Leighlani Bowlen   Number of children: Not on file   Years of education: Not on file   Highest education level: Not on file  Occupational History   Occupation: Retired  Tobacco Use   Smoking status: Never    Passive exposure: Never   Smokeless tobacco: Never  Vaping Use   Vaping status: Never Used  Substance and Sexual Activity   Alcohol use: Yes    Alcohol/week: 3.0 standard drinks of alcohol    Types: 3 Glasses of wine per week    Comment: rarely   Drug use: No   Sexual activity: Yes    Partners: Male    Birth control/protection: Surgical  Other Topics Concern   Not on file  Social History Narrative   Not on file   Social Determinants of Health   Financial Resource Strain: Not on file  Food Insecurity: Not on file  Transportation Needs: Not on file  Physical Activity: Not on file  Stress: Not on file  Social Connections: Not on file   Family History  Problem Relation Age of Onset   Stroke Mother    Diabetes Mother    Stroke Father    Anesthesia problems Father  post-op N/V   Heart attack Father    Parkinson's disease Father    Hypertension Brother    Allergies  Allergen Reactions   Sulfa Antibiotics Nausea And Vomiting    Sister has anaphylaxis. Throat swelling, stops breathing, heart stops   Amoxicillin Nausea And Vomiting   Adhesive [Tape] Other (See Comments)    SKIN FLAKES OFF   Morphine And Codeine Other (See Comments)    HYPERACTIVITY AND HAS NO RELIEF OF PAIN   Prior to Admission medications   Medication Sig Start Date End Date Taking? Authorizing Provider  acetaminophen (TYLENOL) 325 MG tablet Take 2 tablets (650 mg total) by mouth every 6 (six) hours as needed for up to 30 doses for mild pain or moderate pain. 08/11/23  Yes Trifan, Kermit Balo, MD  ascorbic acid (VITAMIN C) 500 MG tablet Take 1,000 mg by mouth daily.   Yes  [provider]  estradiol (ESTRACE) 0.5 MG tablet Take 0.5 mg by mouth daily.   Yes [provider]  ezetimibe (ZETIA) 10 MG tablet Take 10 mg by mouth daily.   Yes [provider]  Fluticasone Propionate (KLS ALLER-FLO NA) Place into the nose.   Yes [provider]  ibuprofen (ADVIL) 600 MG tablet Take 1 tablet (600 mg total) by mouth every 6 (six) hours as needed for mild pain or moderate pain. 08/11/23 09/10/23 Yes Trifan, Kermit Balo, MD  loratadine (CLARITIN) 10 MG tablet Take 10 mg by mouth daily.   Yes [provider]  oxyCODONE (ROXICODONE) 5 MG immediate release tablet Take 1 tablet (5 mg total) by mouth every 6 (six) hours as needed for up to 20 doses for severe pain. 08/11/23  Yes Terald Sleeper, MD   DG Hip Alvera Novel W or Wo Pelvis 2-3 Views Right  Result Date: 08/11/2023 CLINICAL DATA:  Fall; right hip pain w/ limited mobility EXAM: DG HIP (WITH OR WITHOUT PELVIS) 2-3V RIGHT COMPARISON:  None available FINDINGS: Mildly displaced transverse fracture of the right femoral neck. No additional fracture or dislocation. IMPRESSION: Mildly displaced transverse fracture of the right femoral neck. Electronically Signed   By: Acquanetta Belling M.D.   On: 08/11/2023 11:07   DG Shoulder Right Portable  Result Date: 08/11/2023 CLINICAL DATA:  Slipped and fell hitting floor. Right shoulder pain and deformity. EXAM: RIGHT SHOULDER - 1 VIEW COMPARISON:  None available FINDINGS: Comminuted, mildly impacted fracture of the proximal humerus extending through the greater tuberosity. Lucency at the inferior tip of the glenoid also suspicious for fracture. Prominent subacromial spur is noted. Mild acromioclavicular osteoarthrosis. IMPRESSION: 1. Comminuted fracture of the right proximal humerus involving greater tuberosity. 2. Findings suspicious for nondisplaced fracture of the inferior glenoid. Electronically Signed   By: Acquanetta Belling M.D.   On: 08/11/2023 09:18     Positive ROS: All other systems have been reviewed and were otherwise negative with the exception of those mentioned in the HPI and as above.  Physical Exam: General: Alert, no acute distress   MUSCULOSKELETAL:   RUE:  No erythema, abrasions, or ecchymosis about the shoulder In sling Moving fingers and wrist without pain Radial pulse 2+  LUE:  Moving shoulder, elbow, and wrist without pain   RLE: No ROM due to known fracture No ecchymosis, mild swelling about the lateral hip Intact dorsiflexion/plantarflexion Distal pulses 2+  LLE: No pain with log rolling of the hip   Assessment: Right femoral neck fracture Right humeral neck fracture  Plan:  Dr. Charlann Boxer and I  reviewed imaging of her hip and shoulder.  I discussed with the patient and her husband the diagnoses and proposed treatment plan.   At this time, the right shoulder will be treated non-operatively with NWB in a sling.   The right hip will require surgical intervention with a hip replacement.  There is unfortunately no availability tonight or tomorrow at [facility] due to staffing issues.  Dr. Charlann Boxer and I will discuss with our colleagues to figure out the most expeditious way to have this done.   For now, NWB RLE. Pain control as needed. May have a regular diet today.   Cassandria Anger, PA-C Cell [phone]   08/11/2023 2:50 PM

## 2023-08-11 NOTE — H&P (Signed)
History and Physical    Patient: Melissa Mcdowell XLK:[id number] DOB: [birth year] DOA: 08/11/2023 DOS: the patient was seen and examined on 08/11/2023 PCP: Charlton Amor, DO  Patient coming from: Home  Chief Complaint:  Chief Complaint  Patient presents with   Fall   HPI: Melissa Mcdowell is a 72 y.o. female with medical history significant of GERD, anemia, osteoarthritis, headache, hyperlipidemia who had a mechanical fall at home injuring her right shoulder and right hip.  She denied fever, chills, rhinorrhea, sore throat, wheezing or hemoptysis.  No chest pain, palpitations, diaphoresis, PND, orthopnea or pitting edema of the lower extremities.  No abdominal pain, nausea, emesis, diarrhea, constipation, melena or hematochezia.  No flank pain, dysuria, frequency or hematuria.  No polyuria, polydipsia, polyphagia or blurred vision.   Lab work: Her CBC was normal.  Glucose 106 and calcium 8.7 mg/dL on chemistry but otherwise unremarkable.  Imaging: Portable 1 view chest radiograph no acute cardiopulmonary disease.  Right humeral surgical neck fracture.  Right hip x-ray with mildly displaced transverse fracture of the right femoral neck.  ED course: Initial vital signs were temperature 97.7 degrees ongoing pulse 50 sanguinous ration 18, BP 156/103 mmHg O2 sat 95% on room air.  Patient received fentanyl 50 mcg IVP, Toradol 30 mg IVP and 1 tablet of oxycodone.   Review of Systems: As mentioned in the history of present illness. All other systems reviewed and are negative.  Past Medical History:  Diagnosis Date   Acid reflux    occasional   Anemia    no current med.   Arthritis    lower back, fingers   Complication of anesthesia    states has a small mouth   Dental crowns present    Headache(784.0)    sinus - 3-4 x/month   Hypercholesteremia    Mucous cyst of finger 06/18/2012   left long finger   Past Surgical History:  Procedure Laterality Date   ABDOMINAL HYSTERECTOMY      complete   APPENDECTOMY     CATARACT EXTRACTION Bilateral    CESAREAN SECTION     ECTOPIC PREGNANCY SURGERY     MASS EXCISION  07/10/2012   Procedure: MINOR EXCISION OF MASS;  Surgeon: Wyn Forster., MD;  Location: [facility];  Service: Orthopedics;  Laterality: Left;   OOPHORECTOMY     REPAIR OF COMPLEX TRACTION RETINAL DETACHMENT Left 12/23/2018   Procedure: REPAIR OF  RETINAL DETACHMENT LEFT EYE;  Surgeon: Carmela Rima, MD;  Location: [facility] OR;  Service: Ophthalmology;  Laterality: Left;   Social History:  reports that she has never smoked. She has never been exposed to tobacco smoke. She has never used smokeless tobacco. She reports current alcohol use of about 3.0 standard drinks of alcohol per week. She reports that she does not use drugs.  Allergies  Allergen Reactions   Sulfa Antibiotics Nausea And Vomiting    Sister has anaphylaxis. Throat swelling, stops breathing, heart stops   Amoxicillin Nausea And Vomiting   Adhesive [Tape] Other (See Comments)    SKIN FLAKES OFF   Morphine And Codeine Other (See Comments)    HYPERACTIVITY AND HAS NO RELIEF OF PAIN    Family History  Problem Relation Age of Onset   Stroke Mother    Diabetes Mother    Stroke Father    Anesthesia problems Father        post-op N/V   Heart attack Father    Parkinson's disease Father  Hypertension Brother     Prior to Admission medications   Medication Sig Start Date End Date Taking? Authorizing Provider  acetaminophen (TYLENOL) 325 MG tablet Take 2 tablets (650 mg total) by mouth every 6 (six) hours as needed for up to 30 doses for mild pain or moderate pain. 08/11/23  Yes Terald Sleeper, MD  ibuprofen (ADVIL) 600 MG tablet Take 1 tablet (600 mg total) by mouth every 6 (six) hours as needed for mild pain or moderate pain. 08/11/23 09/10/23 Yes Trifan, Kermit Balo, MD  oxyCODONE (ROXICODONE) 5 MG immediate release tablet Take 1 tablet (5 mg total) by mouth every 6 (six) hours  as needed for up to 20 doses for severe pain. 08/11/23  Yes Terald Sleeper, MD  estradiol (ESTRACE) 0.5 MG tablet Take 0.5 mg by mouth daily.    [provider]  ezetimibe (ZETIA) 10 MG tablet Take 10 mg by mouth daily.    [provider]  Fluticasone Propionate (KLS ALLER-FLO NA) Place into the nose.    [provider]  loratadine (CLARITIN) 10 MG tablet Take 10 mg by mouth daily.    [provider]    Physical Exam: Vitals:   08/11/23 0823 08/11/23 0830 08/11/23 0900 08/11/23 0930  BP:  (!) 146/80 136/74 123/76  Pulse:  (!) 52 (!) 56 64  Resp:  16 18 18   Temp:      TempSrc:      SpO2: 96% 96% 100% 98%  Weight:      Height:       Physical Exam Vitals and nursing note reviewed.  Constitutional:      General: She is awake. She is not in acute distress.    Appearance: Normal appearance.  HENT:     Head: Normocephalic.     Nose: No rhinorrhea.     Mouth/Throat:     Mouth: Mucous membranes are moist.  Eyes:     General: No scleral icterus.    Pupils: Pupils are equal, round, and reactive to light.  Neck:     Vascular: No JVD.  Cardiovascular:     Rate and Rhythm: Normal rate and regular rhythm.     Heart sounds: S1 normal and S2 normal.  Pulmonary:     Effort: Pulmonary effort is normal.     Breath sounds: Normal breath sounds. No wheezing, rhonchi or rales.  Abdominal:     General: Bowel sounds are normal.     Palpations: Abdomen is soft.     Tenderness: There is no abdominal tenderness.  Musculoskeletal:     Right shoulder: Tenderness present. Decreased range of motion.     Cervical back: Neck supple.     Right hip: Tenderness present. Decreased range of motion.     Right lower leg: No edema.     Left lower leg: No edema.  Skin:    General: Skin is warm and dry.  Neurological:     General: No focal deficit present.     Mental Status: She is alert and oriented to person, place, and time.  Psychiatric:        Attention and  Perception: Attention normal.        Mood and Affect: Mood normal.        Behavior: Behavior normal. Behavior is cooperative.   Data Reviewed:  Results are pending, will review when available.  EKG: Vent. rate 71 BPM PR interval 141 ms QRS duration 112 ms QT/QTcB 420/457 ms P-R-T axes 29 -  25 -45 Sinus rhythm Abnormal R-wave progression, early transition Left ventricular hypertrophy Nonspecific T abnormalities, diffuse leads  Assessment and Plan: Principal Problem:   Closed right hip fracture, initial encounter (HCC) Admit to MedSurg/inpatient. Ice area as needed. Buck's traction per protocol. Analgesics as needed. Antiemetics as needed. Consult TOC team. Consult nutritional services. PT evaluation after surgery. Orthopedic surgery evaluation appreciated.  Active Problems:   Right humeral fracture Analgesics as needed. Will follow Ortho recommendations.    Hyperglycemia Check fasting glucose in AM. Further workup depending on results.    Hypercalcemia Recheck calcium and albumin level. Further workup depending on results.    Advance Care Planning:   Code Status: Full Code   Consults: Orthopedic surgery (Dr. Charlann Boxer).  Family Communication:   Severity of Illness: The appropriate patient status for this patient is INPATIENT. Inpatient status is judged to be reasonable and necessary in order to provide the required intensity of service to ensure the patient's safety. The patient's presenting symptoms, physical exam findings, and initial radiographic and laboratory data in the context of their chronic comorbidities is felt to place them at high risk for further clinical deterioration. Furthermore, it is not anticipated that the patient will be medically stable for discharge from the hospital within 2 midnights of admission.   * I certify that at the point of admission it is my clinical judgment that the patient will require inpatient hospital care spanning beyond 2  midnights from the point of admission due to high intensity of service, high risk for further deterioration and high frequency of surveillance required.*  Author: Bobette Mo, MD 08/11/2023 12:33 PM  For on call review [email or web address].   This document was prepared using Dragon voice recognition software and may contain some unintended transcription errors.

## 2023-08-11 NOTE — H&P (View-Only) (Signed)
ORTHOPAEDIC CONSULTATION  REQUESTING PHYSICIAN: Bobette Mo, MD  PCP:  Charlton Amor, DO  Chief Complaint: Ground level fall with right hip pain, right shoulder pain   HPI: Melissa Mcdowell is a 72 y.o. female presenting to ED with pain in her right shoulder. Slipped in kitchen and had FOOSH type fall, felt pain in her right shoulder with limited mobility afterwards. No prior hx of surgeries or dislocation of the shoulder. She is right handed. No other injuries reported.   She was found to have a right femoral neck fracture and right humeral neck fracture.   Orthopaedics was consulted for evaluation and management.  Today, she is resting in a stretcher in the ER with her husband at the bedside. She is in good spirits, and explained her injury this morning. She does not notice any pain or injuries aside from the right hip and right shoulder. She lives with her husband. She normally ambulates without assistive devices. She is not diabetic. She does not take blood thinners.   Past Medical History:  Diagnosis Date   Acid reflux    occasional   Anemia    no current med.   Arthritis    lower back, fingers   Complication of anesthesia    states has a small mouth   Dental crowns present    Headache(784.0)    sinus - 3-4 x/month   Hypercholesteremia    Mucous cyst of finger 06/18/2012   left long finger   Past Surgical History:  Procedure Laterality Date   ABDOMINAL HYSTERECTOMY     complete   APPENDECTOMY     CATARACT EXTRACTION Bilateral    CESAREAN SECTION     ECTOPIC PREGNANCY SURGERY     MASS EXCISION  07/10/2012   Procedure: MINOR EXCISION OF MASS;  Surgeon: Wyn Forster., MD;  Location: [facility];  Service: Orthopedics;  Laterality: Left;   OOPHORECTOMY     REPAIR OF COMPLEX TRACTION RETINAL DETACHMENT Left 12/23/2018   Procedure: REPAIR OF  RETINAL DETACHMENT LEFT EYE;  Surgeon: Carmela Rima, MD;  Location: [facility];  Service:  Ophthalmology;  Laterality: Left;   Social History   Socioeconomic History   Marital status: Married    Spouse name: Leighlani Bowlen   Number of children: Not on file   Years of education: Not on file   Highest education level: Not on file  Occupational History   Occupation: Retired  Tobacco Use   Smoking status: Never    Passive exposure: Never   Smokeless tobacco: Never  Vaping Use   Vaping status: Never Used  Substance and Sexual Activity   Alcohol use: Yes    Alcohol/week: 3.0 standard drinks of alcohol    Types: 3 Glasses of wine per week    Comment: rarely   Drug use: No   Sexual activity: Yes    Partners: Male    Birth control/protection: Surgical  Other Topics Concern   Not on file  Social History Narrative   Not on file   Social Determinants of Health   Financial Resource Strain: Not on file  Food Insecurity: Not on file  Transportation Needs: Not on file  Physical Activity: Not on file  Stress: Not on file  Social Connections: Not on file   Family History  Problem Relation Age of Onset   Stroke Mother    Diabetes Mother    Stroke Father    Anesthesia problems Father  post-op N/V   Heart attack Father    Parkinson's disease Father    Hypertension Brother    Allergies  Allergen Reactions   Sulfa Antibiotics Nausea And Vomiting    Sister has anaphylaxis. Throat swelling, stops breathing, heart stops   Amoxicillin Nausea And Vomiting   Adhesive [Tape] Other (See Comments)    SKIN FLAKES OFF   Morphine And Codeine Other (See Comments)    HYPERACTIVITY AND HAS NO RELIEF OF PAIN   Prior to Admission medications   Medication Sig Start Date End Date Taking? Authorizing Provider  acetaminophen (TYLENOL) 325 MG tablet Take 2 tablets (650 mg total) by mouth every 6 (six) hours as needed for up to 30 doses for mild pain or moderate pain. 08/11/23  Yes Trifan, Kermit Balo, MD  ascorbic acid (VITAMIN C) 500 MG tablet Take 1,000 mg by mouth daily.   Yes  [provider]  estradiol (ESTRACE) 0.5 MG tablet Take 0.5 mg by mouth daily.   Yes [provider]  ezetimibe (ZETIA) 10 MG tablet Take 10 mg by mouth daily.   Yes [provider]  Fluticasone Propionate (KLS ALLER-FLO NA) Place into the nose.   Yes [provider]  ibuprofen (ADVIL) 600 MG tablet Take 1 tablet (600 mg total) by mouth every 6 (six) hours as needed for mild pain or moderate pain. 08/11/23 09/10/23 Yes Trifan, Kermit Balo, MD  loratadine (CLARITIN) 10 MG tablet Take 10 mg by mouth daily.   Yes [provider]  oxyCODONE (ROXICODONE) 5 MG immediate release tablet Take 1 tablet (5 mg total) by mouth every 6 (six) hours as needed for up to 20 doses for severe pain. 08/11/23  Yes Terald Sleeper, MD   DG Hip Alvera Novel W or Wo Pelvis 2-3 Views Right  Result Date: 08/11/2023 CLINICAL DATA:  Fall; right hip pain w/ limited mobility EXAM: DG HIP (WITH OR WITHOUT PELVIS) 2-3V RIGHT COMPARISON:  None available FINDINGS: Mildly displaced transverse fracture of the right femoral neck. No additional fracture or dislocation. IMPRESSION: Mildly displaced transverse fracture of the right femoral neck. Electronically Signed   By: Acquanetta Belling M.D.   On: 08/11/2023 11:07   DG Shoulder Right Portable  Result Date: 08/11/2023 CLINICAL DATA:  Slipped and fell hitting floor. Right shoulder pain and deformity. EXAM: RIGHT SHOULDER - 1 VIEW COMPARISON:  None available FINDINGS: Comminuted, mildly impacted fracture of the proximal humerus extending through the greater tuberosity. Lucency at the inferior tip of the glenoid also suspicious for fracture. Prominent subacromial spur is noted. Mild acromioclavicular osteoarthrosis. IMPRESSION: 1. Comminuted fracture of the right proximal humerus involving greater tuberosity. 2. Findings suspicious for nondisplaced fracture of the inferior glenoid. Electronically Signed   By: Acquanetta Belling M.D.   On: 08/11/2023 09:18     Positive ROS: All other systems have been reviewed and were otherwise negative with the exception of those mentioned in the HPI and as above.  Physical Exam: General: Alert, no acute distress   MUSCULOSKELETAL:   RUE:  No erythema, abrasions, or ecchymosis about the shoulder In sling Moving fingers and wrist without pain Radial pulse 2+  LUE:  Moving shoulder, elbow, and wrist without pain   RLE: No ROM due to known fracture No ecchymosis, mild swelling about the lateral hip Intact dorsiflexion/plantarflexion Distal pulses 2+  LLE: No pain with log rolling of the hip   Assessment: Right femoral neck fracture Right humeral neck fracture  Plan:  Dr. Charlann Boxer and I  reviewed imaging of her hip and shoulder.  I discussed with the patient and her husband the diagnoses and proposed treatment plan.   At this time, the right shoulder will be treated non-operatively with NWB in a sling.   The right hip will require surgical intervention with a hip replacement.  There is unfortunately no availability tonight or tomorrow at [facility] due to staffing issues.  Dr. Charlann Boxer and I will discuss with our colleagues to figure out the most expeditious way to have this done.   For now, NWB RLE. Pain control as needed. May have a regular diet today.   Cassandria Anger, PA-C Cell [phone]   08/11/2023 2:50 PM

## 2023-08-11 NOTE — Plan of Care (Signed)
Problem: Education: Goal: Knowledge of General Education information will improve Description: Including pain rating scale, medication(s)/side effects and non-pharmacologic comfort measures Outcome: Progressing   Problem: Clinical Measurements: Goal: Ability to maintain clinical measurements within normal limits will improve Outcome: Progressing   Problem: Pain Managment: Goal: General experience of comfort will improve Outcome: Progressing   Problem: Safety: Goal: Ability to remain free from injury will improve Outcome: Progressing   Haydee Salter, RN 08/11/23 7:40 PM

## 2023-08-11 NOTE — ED Triage Notes (Signed)
Patient BIB EMS for fall. Patient reports she was doing dishes when she slipped and fell hitting the dishwasher door. Reports right shoulder pain and deformity. Patient denies hitting head and LOC. Not on blood thinners.

## 2023-08-11 NOTE — ED Provider Notes (Addendum)
[place] EMERGENCY DEPARTMENT AT [facility] Provider Note   CSN: [id number] Arrival date & time: 08/11/23  8657     History  Chief Complaint  Patient presents with   Melissa Mcdowell is a 72 y.o. female presenting to ED with pain in her right shoulder.  Slipped in kitchen and had FOOSH type fall, felt pain in her right shoulder with limited mobility afterwards.  No prior hx of surgeries or dislocation of the shoulder.  She is right handed.  No other injuries reported.  EMS gave 100 mcg fentanyl   HPI     Home Medications Prior to Admission medications   Medication Sig Start Date End Date Taking? Authorizing Provider  acetaminophen (TYLENOL) 325 MG tablet Take 2 tablets (650 mg total) by mouth every 6 (six) hours as needed for up to 30 doses for mild pain or moderate pain. 08/11/23  Yes Osiah Haring, Kermit Balo, MD  ascorbic acid (VITAMIN C) 500 MG tablet Take 1,000 mg by mouth daily.   Yes [provider]  estradiol (ESTRACE) 0.5 MG tablet Take 0.5 mg by mouth daily.   Yes [provider]  ezetimibe (ZETIA) 10 MG tablet Take 10 mg by mouth daily.   Yes [provider]  Fluticasone Propionate (KLS ALLER-FLO NA) Place into the nose.   Yes [provider]  ibuprofen (ADVIL) 600 MG tablet Take 1 tablet (600 mg total) by mouth every 6 (six) hours as needed for mild pain or moderate pain. 08/11/23 09/10/23 Yes Mateja Dier, Kermit Balo, MD  loratadine (CLARITIN) 10 MG tablet Take 10 mg by mouth daily.   Yes [provider]  oxyCODONE (ROXICODONE) 5 MG immediate release tablet Take 1 tablet (5 mg total) by mouth every 6 (six) hours as needed for up to 20 doses for severe pain. 08/11/23  Yes Nazair Fortenberry, Kermit Balo, MD      Allergies    Sulfa antibiotics, Amoxicillin, Adhesive [tape], and Morphine and codeine    Review of Systems   Review of Systems  Physical Exam Updated Vital Signs BP (!) 152/82   Pulse 67   Temp 98.4 F (36.9 C)    Resp 18   Ht 5\' 3"  (1.6 m)   Wt 77.1 kg   SpO2 94%   BMI 30.11 kg/m  Physical Exam Constitutional:      General: She is not in acute distress. HENT:     Head: Normocephalic and atraumatic.  Eyes:     Conjunctiva/sclera: Conjunctivae normal.     Pupils: Pupils are equal, round, and reactive to light.  Cardiovascular:     Rate and Rhythm: Normal rate and regular rhythm.     Pulses: Normal pulses.  Pulmonary:     Effort: Pulmonary effort is normal. No respiratory distress.  Musculoskeletal:     Comments: Tenderness and squaring of the humeral head right shoulder Limited ROM in right shoulder Right elbow, wrist, forearm, hand unaffected  Skin:    General: Skin is warm and dry.  Neurological:     General: No focal deficit present.     Mental Status: She is alert and oriented to person, place, and time. Mental status is at baseline.     Sensory: No sensory deficit.     Motor: No weakness.  Psychiatric:        Mood and Affect: Mood normal.        Behavior: Behavior normal.     ED Results / Procedures / Treatments  Labs (all labs ordered are listed, but only abnormal results are displayed) Labs Reviewed  BASIC METABOLIC PANEL - Abnormal; Notable for the following components:      Result Value   Glucose, Bld 106 (*)    Calcium 8.7 (*)    All other components within normal limits  CBC  BASIC METABOLIC PANEL    EKG None  Radiology Chest Portable 1 View  Result Date: 08/11/2023 CLINICAL DATA:  Larey Seat, hip injury EXAM: PORTABLE CHEST - 1 VIEW COMPARISON:  None Available. FINDINGS: Lungs are clear. Heart size and mediastinal contours are within normal limits. No effusion. Transverse fracture across the surgical neck of the right humerus. IMPRESSION: 1. No acute cardiopulmonary disease. 2. Right humeral surgical neck fracture. Electronically Signed   By: Corlis Leak M.D.   On: 08/11/2023 14:58   DG Hip Unilat W or Wo Pelvis 2-3 Views Right  Result Date: 08/11/2023 CLINICAL  DATA:  Fall; right hip pain w/ limited mobility EXAM: DG HIP (WITH OR WITHOUT PELVIS) 2-3V RIGHT COMPARISON:  None available FINDINGS: Mildly displaced transverse fracture of the right femoral neck. No additional fracture or dislocation. IMPRESSION: Mildly displaced transverse fracture of the right femoral neck. Electronically Signed   By: Acquanetta Belling M.D.   On: 08/11/2023 11:07   DG Shoulder Right Portable  Result Date: 08/11/2023 CLINICAL DATA:  Slipped and fell hitting floor. Right shoulder pain and deformity. EXAM: RIGHT SHOULDER - 1 VIEW COMPARISON:  None available FINDINGS: Comminuted, mildly impacted fracture of the proximal humerus extending through the greater tuberosity. Lucency at the inferior tip of the glenoid also suspicious for fracture. Prominent subacromial spur is noted. Mild acromioclavicular osteoarthrosis. IMPRESSION: 1. Comminuted fracture of the right proximal humerus involving greater tuberosity. 2. Findings suspicious for nondisplaced fracture of the inferior glenoid. Electronically Signed   By: Acquanetta Belling M.D.   On: 08/11/2023 09:18    Procedures Procedures    Medications Ordered in ED Medications  acetaminophen (TYLENOL) tablet 650 mg (650 mg Oral Given 08/11/23 1641)    Or  acetaminophen (TYLENOL) suppository 650 mg ( Rectal See Alternative 08/11/23 1641)  ondansetron (ZOFRAN) tablet 4 mg (has no administration in time range)    Or  ondansetron (ZOFRAN) injection 4 mg (has no administration in time range)  HYDROmorphone (DILAUDID) injection 0.5 mg (0.5 mg Intravenous Given 08/11/23 1409)  senna-docusate (Senokot-S) tablet 1 tablet (has no administration in time range)  oxyCODONE (Oxy IR/ROXICODONE) immediate release tablet 5 mg (5 mg Oral Given 08/11/23 1640)  oxyCODONE-acetaminophen (PERCOCET/ROXICET) 5-325 MG per tablet 1 tablet (1 tablet Oral Given 08/11/23 0930)  ketorolac (TORADOL) 30 MG/ML injection 30 mg (30 mg Intravenous Given 08/11/23 0930)  fentaNYL  (SUBLIMAZE) injection 50 mcg (50 mcg Intravenous Given 08/11/23 1140)    ED Course/ Medical Decision Making/ A&P Clinical Course as of 08/11/23 1747  Mon Aug 11, 2023  1142 Dr Charlann Boxer orthopedics to see patient in hospital [MT]  1232 Admitted to Dr Robb Matar hospitalist [MT]    Clinical Course User Index [MT] Terald Sleeper, MD                                 Medical Decision Making Amount and/or Complexity of Data Reviewed Labs: ordered. Radiology: ordered.  Risk OTC drugs. Prescription drug management. Decision regarding hospitalization.   Mechanical fall today in kitchen with pain in right shoulder Ddx includes fracture vs dislocation vs muscle injury Neurovascularly  intact on arrival  Supplemental history provided by EMS Fentanyl given by EMS prior to arrival for pain  Xray of right shoulder ordered and personally reviewed and interpreted, notable for impacted proximal humeral head fracture, potential glenoid fracture   *  Patient initially been prepared for discharge.  However the time of discharge she began reporting pain in her right hip and was found to not be able to bear weight on her right leg when standing.  Initially she did not have any notable tenderness with range of motion testing of the lower extremities but now appears to have some discomfort with external rotation of the right hip and some mild bony tenderness along the pelvic ring on the right side.  I wonder now whether there may be an underlying fracture that was mass by her initial pain medications of fentanyl.  I have ordered x-ray imaging.  Given that the patient is not able to bear weight on her right leg, I am anticipating she will likely require admission regardless to the hospital and may need PT evaluation and SNF placement.  I have ordered basic labs as well.  The patient and her husband are in agreement.  X-ray concerning for right femoral neck fracture.  I spoke to the orthopedic surgeon Dr. Boneta Lucks  consulted - will see patient in hospital.  Patient was admitted to the hospitalist    Final Clinical Impression(s) / ED Diagnoses Final diagnoses:  Closed fracture of proximal end of right humerus, unspecified fracture morphology, initial encounter  Closed fracture of glenoid cavity and neck of right scapula, initial encounter  Closed fracture of neck of right femur, initial encounter (HCC)    Rx / DC Orders ED Discharge Orders          Ordered    oxyCODONE (ROXICODONE) 5 MG immediate release tablet  Every 6 hours PRN        08/11/23 0937    ibuprofen (ADVIL) 600 MG tablet  Every 6 hours PRN        08/11/23 0937    acetaminophen (TYLENOL) 325 MG tablet  Every 6 hours PRN        08/11/23 0937              Terald Sleeper, MD 08/11/23 0737    Terald Sleeper, MD 08/11/23 1747

## 2023-08-11 NOTE — Discharge Instructions (Addendum)
You will need to follow up with an orthopedic surgeon for the fracture (broken bone) in your right humerus.  In the meantime you should wear the arm sling AT ALL TIMES.  Do not lift any objects or use your right hand for activities.   INSTRUCTIONS AFTER JOINT REPLACEMENT   Remove items at home which could result in a fall. This includes throw rugs or furniture in walking pathways ICE to the affected joint every three hours while awake for 30 minutes at a time, for at least the first 3-5 days, and then as needed for pain and swelling.  Continue to use ice for pain and swelling. You may notice swelling that will progress down to the foot and ankle.  This is normal after surgery.  Elevate your leg when you are not up walking on it.   Continue to use the breathing machine you got in the hospital (incentive spirometer) which will help keep your temperature down.  It is common for your temperature to cycle up and down following surgery, especially at night when you are not up moving around and exerting yourself.  The breathing machine keeps your lungs expanded and your temperature down.   DIET:  As you were doing prior to hospitalization, we recommend a well-balanced diet.  DRESSING / WOUND CARE / SHOWERING  Keep the surgical dressing until follow up.  The dressing is water proof, so you can shower without any extra covering.  IF THE DRESSING FALLS OFF or the wound gets wet inside, change the dressing with sterile gauze.  Please use good hand washing techniques before changing the dressing.  Do not use any lotions or creams on the incision until instructed by your surgeon.    ACTIVITY  Increase activity slowly as tolerated, but follow the weight bearing instructions below.   No driving for 6 weeks or until further direction given by your physician.  You cannot drive while taking narcotics.  No lifting or carrying greater than 10 lbs. until further directed by your surgeon. Avoid periods of inactivity  such as sitting longer than an hour when not asleep. This helps prevent blood clots.  You may return to work once you are authorized by your doctor.     WEIGHT BEARING   Weight bearing as tolerated with assist device (walker, cane, etc) as directed, use it as long as suggested by your surgeon or therapist, typically at least 4-6 weeks.   EXERCISES  Results after joint replacement surgery are often greatly improved when you follow the exercise, range of motion and muscle strengthening exercises prescribed by your doctor. Safety measures are also important to protect the joint from further injury. Any time any of these exercises cause you to have increased pain or swelling, decrease what you are doing until you are comfortable again and then slowly increase them. If you have problems or questions, call your caregiver or physical therapist for advice.   Rehabilitation is important following a joint replacement. After just a few days of immobilization, the muscles of the leg can become weakened and shrink (atrophy).  These exercises are designed to build up the tone and strength of the thigh and leg muscles and to improve motion. Often times heat used for twenty to thirty minutes before working out will loosen up your tissues and help with improving the range of motion but do not use heat for the first two weeks following surgery (sometimes heat can increase post-operative swelling).   These exercises can be done  on a training (exercise) mat, on the floor, on a table or on a bed. Use whatever works the best and is most comfortable for you.    Use music or television while you are exercising so that the exercises are a pleasant break in your day. This will make your life better with the exercises acting as a break in your routine that you can look forward to.   Perform all exercises about fifteen times, three times per day or as directed.  You should exercise both the operative leg and the other leg as  well.  Exercises include:   Quad Sets - Tighten up the muscle on the front of the thigh (Quad) and hold for 5-10 seconds.   Straight Leg Raises - With your knee straight (if you were given a brace, keep it on), lift the leg to 60 degrees, hold for 3 seconds, and slowly lower the leg.  Perform this exercise against resistance later as your leg gets stronger.  Leg Slides: Lying on your back, slowly slide your foot toward your buttocks, bending your knee up off the floor (only go as far as is comfortable). Then slowly slide your foot back down until your leg is flat on the floor again.  Angel Wings: Lying on your back spread your legs to the side as far apart as you can without causing discomfort.  Hamstring Strength:  Lying on your back, push your heel against the floor with your leg straight by tightening up the muscles of your buttocks.  Repeat, but this time bend your knee to a comfortable angle, and push your heel against the floor.  You may put a pillow under the heel to make it more comfortable if necessary.   A rehabilitation program following joint replacement surgery can speed recovery and prevent re-injury in the future due to weakened muscles. Contact your doctor or a physical therapist for more information on knee rehabilitation.    CONSTIPATION  Constipation is defined medically as fewer than three stools per week and severe constipation as less than one stool per week.  Even if you have a regular bowel pattern at home, your normal regimen is likely to be disrupted due to multiple reasons following surgery.  Combination of anesthesia, postoperative narcotics, change in appetite and fluid intake all can affect your bowels.   YOU MUST use at least one of the following options; they are listed in order of increasing strength to get the job done.  They are all available over the counter, and you may need to use some, POSSIBLY even all of these options:    Drink plenty of fluids (prune juice  may be helpful) and high fiber foods Colace 100 mg by mouth twice a day  Senokot for constipation as directed and as needed Dulcolax (bisacodyl), take with full glass of water  Miralax (polyethylene glycol) once or twice a day as needed.  If you have tried all these things and are unable to have a bowel movement in the first 3-4 days after surgery call either your surgeon or your primary doctor.    If you experience loose stools or diarrhea, hold the medications until you stool forms back up.  If your symptoms do not get better within 1 week or if they get worse, check with your doctor.  If you experience "the worst abdominal pain ever" or develop nausea or vomiting, please contact the office immediately for further recommendations for treatment.   ITCHING:  If you experience  itching with your medications, try taking only a single pain pill, or even half a pain pill at a time.  You can also use Benadryl over the counter for itching or also to help with sleep.   TED HOSE STOCKINGS:  Use stockings on both legs until for at least 2 weeks or as directed by physician office. They may be removed at night for sleeping.  MEDICATIONS:  See your medication summary on the "After Visit Summary" that nursing will review with you.  You may have some home medications which will be placed on hold until you complete the course of blood thinner medication.  It is important for you to complete the blood thinner medication as prescribed.  PRECAUTIONS:  If you experience chest pain or shortness of breath - call 911 immediately for transfer to the hospital emergency department.   If you develop a fever greater that 101 F, purulent drainage from wound, increased redness or drainage from wound, foul odor from the wound/dressing, or calf pain - CONTACT YOUR SURGEON.                                                   FOLLOW-UP APPOINTMENTS:  If you do not already have a post-op appointment, please call the office for an  appointment to be seen by your surgeon.  Guidelines for how soon to be seen are listed in your "After Visit Summary", but are typically between 1-4 weeks after surgery.  OTHER INSTRUCTIONS:   Knee Replacement:  Do not place pillow under knee, focus on keeping the knee straight while resting. CPM instructions: 0-90 degrees, 2 hours in the morning, 2 hours in the afternoon, and 2 hours in the evening. Place foam block, curve side up under heel at all times except when in CPM or when walking.  DO NOT modify, tear, cut, or change the foam block in any way.  POST-OPERATIVE OPIOID TAPER INSTRUCTIONS: It is important to wean off of your opioid medication as soon as possible. If you do not need pain medication after your surgery it is ok to stop day one. Opioids include: Codeine, Hydrocodone(Norco, Vicodin), Oxycodone(Percocet, oxycontin) and hydromorphone amongst others.  Long term and even short term use of opiods can cause: Increased pain response Dependence Constipation Depression Respiratory depression And more.  Withdrawal symptoms can include Flu like symptoms Nausea, vomiting And more Techniques to manage these symptoms Hydrate well Eat regular healthy meals Stay active Use relaxation techniques(deep breathing, meditating, yoga) Do Not substitute Alcohol to help with tapering If you have been on opioids for less than two weeks and do not have pain than it is ok to stop all together.  Plan to wean off of opioids This plan should start within one week post op of your joint replacement. Maintain the same interval or time between taking each dose and first decrease the dose.  Cut the total daily intake of opioids by one tablet each day Next start to increase the time between doses. The last dose that should be eliminated is the evening dose.   MAKE SURE YOU:  Understand these instructions.  Get help right away if you are not doing well or get worse.    Thank you for letting us be  a part of your medical care team.  It is a privilege we respect greatly.  We  hope these instructions will help you stay on track for a fast and full recovery!

## 2023-08-11 NOTE — ED Notes (Signed)
ED TO INPATIENT HANDOFF REPORT  Name/Age/Gender Melissa Mcdowell 72 y.o. female  Code Status    Code Status Orders  (From admission, onward)           Start     Ordered   08/11/23 1235  Full code  Continuous       Question:  By:  Answer:  Consent: discussion documented in EHR   08/11/23 1236           Code Status History     Date Active Date Inactive Code Status Order ID Comments User Context   08/11/2023 1236 08/11/2023 1236 Full Code [id number]  Bobette Mo, MD ED       Home/SNF/Other Home  Chief Complaint Closed right hip fracture, initial encounter [facility]) [S72.001A]  Level of Care/Admitting Diagnosis ED Disposition     ED Disposition  Admit   Condition  --   Comment  Hospital Area: [facility] [[id number]]  Level of Care: Med-Surg [16]  May admit patient to [facility] or [facility] if equivalent level of care is available:: No  Covid Evaluation: Asymptomatic - no recent exposure (last 10 days) testing not required  Diagnosis: Closed right hip fracture, initial encounter [facility]) [[id number]]  Admitting Physician: Bobette Mo [[id number]]  Attending Physician: Bobette Mo [[id number]]  Certification:: I certify this patient will need inpatient services for at least 2 midnights  Expected Medical Readiness: 08/14/2023          Medical History Past Medical History:  Diagnosis Date   Acid reflux    occasional   Anemia    no current med.   Arthritis    lower back, fingers   Complication of anesthesia    states has a small mouth   Dental crowns present    Headache(784.0)    sinus - 3-4 x/month   Hypercholesteremia    Mucous cyst of finger 06/18/2012   left long finger    Allergies Allergies  Allergen Reactions   Sulfa Antibiotics Nausea And Vomiting    Sister has anaphylaxis. Throat swelling, stops breathing, heart stops   Amoxicillin Nausea And Vomiting   Adhesive [Tape] Other (See Comments)    SKIN  FLAKES OFF   Morphine And Codeine Other (See Comments)    HYPERACTIVITY AND HAS NO RELIEF OF PAIN    IV Location/Drains/Wounds Patient Lines/Drains/Airways Status     Active Line/Drains/Airways     Name Placement date Placement time Site Days   Peripheral IV 08/11/23 22 G Left;Posterior Hand 08/11/23  0823  Hand  less than 1   Incision (Closed) 12/23/18 Eye 12/23/18  1851  -- 1692            Labs/Imaging Results for orders placed or performed during the hospital encounter of 08/11/23 (from the past 48 hour(s))  Basic metabolic panel     Status: Abnormal   Collection Time: 08/11/23 11:14 AM  Result Value Ref Range   Sodium 140 135 - 145 mmol/L   Potassium 3.9 3.5 - 5.1 mmol/L   Chloride 106 98 - 111 mmol/L   CO2 23 22 - 32 mmol/L   Glucose, Bld 106 (H) 70 - 99 mg/dL    Comment: Glucose reference range applies only to samples taken after fasting for at least 8 hours.   BUN 15 8 - 23 mg/dL   Creatinine, Ser 3.24 0.44 - 1.00 mg/dL   Calcium 8.7 (L) 8.9 - 10.3 mg/dL   GFR, Estimated >40 >10 mL/min  Comment: (NOTE) Calculated using the CKD-EPI Creatinine Equation (2021)    Anion gap 11 5 - 15    Comment: Performed at [facility], [address], [place], [place] [address]  CBC     Status: None   Collection Time: 08/11/23 11:14 AM  Result Value Ref Range   WBC 10.1 4.0 - 10.5 K/uL   RBC 3.93 3.87 - 5.11 MIL/uL   Hemoglobin 12.6 12.0 - 15.0 g/dL   HCT 64.3 32.9 - 51.8 %   MCV 97.5 80.0 - 100.0 fL   MCH 32.1 26.0 - 34.0 pg   MCHC 32.9 30.0 - 36.0 g/dL   RDW 84.1 66.0 - 63.0 %   Platelets 168 150 - 400 K/uL   nRBC 0.0 0.0 - 0.2 %    Comment: Performed at [facility], [address], [place], [place] [address]   DG Hip Lucienne Capers or Wo Pelvis 2-3 Views Right  Result Date: 08/11/2023 CLINICAL DATA:  Fall; right hip pain w/ limited mobility EXAM: DG HIP (WITH OR WITHOUT PELVIS) 2-3V RIGHT COMPARISON:  None available FINDINGS: Mildly  displaced transverse fracture of the right femoral neck. No additional fracture or dislocation. IMPRESSION: Mildly displaced transverse fracture of the right femoral neck. Electronically Signed   By: Acquanetta Belling M.D.   On: 08/11/2023 11:07   DG Shoulder Right Portable  Result Date: 08/11/2023 CLINICAL DATA:  Slipped and fell hitting floor. Right shoulder pain and deformity. EXAM: RIGHT SHOULDER - 1 VIEW COMPARISON:  None available FINDINGS: Comminuted, mildly impacted fracture of the proximal humerus extending through the greater tuberosity. Lucency at the inferior tip of the glenoid also suspicious for fracture. Prominent subacromial spur is noted. Mild acromioclavicular osteoarthrosis. IMPRESSION: 1. Comminuted fracture of the right proximal humerus involving greater tuberosity. 2. Findings suspicious for nondisplaced fracture of the inferior glenoid. Electronically Signed   By: Acquanetta Belling M.D.   On: 08/11/2023 09:18    Pending Labs Unresulted Labs (From admission, onward)    None       Vitals/Pain Today's Vitals   08/11/23 0830 08/11/23 0900 08/11/23 0930 08/11/23 1031  BP: (!) 146/80 136/74 123/76   Pulse: (!) 52 (!) 56 64   Resp: 16 18 18    Temp:      TempSrc:      SpO2: 96% 100% 98%   Weight:      Height:      PainSc:    4     Isolation Precautions No active isolations  Medications Medications  acetaminophen (TYLENOL) tablet 650 mg (has no administration in time range)    Or  acetaminophen (TYLENOL) suppository 650 mg (has no administration in time range)  ondansetron (ZOFRAN) tablet 4 mg (has no administration in time range)    Or  ondansetron (ZOFRAN) injection 4 mg (has no administration in time range)  HYDROmorphone (DILAUDID) injection 0.5 mg (has no administration in time range)  senna-docusate (Senokot-S) tablet 1 tablet (has no administration in time range)  oxyCODONE-acetaminophen (PERCOCET/ROXICET) 5-325 MG per tablet 1 tablet (1 tablet Oral Given 08/11/23  0930)  ketorolac (TORADOL) 30 MG/ML injection 30 mg (30 mg Intravenous Given 08/11/23 0930)  fentaNYL (SUBLIMAZE) injection 50 mcg (50 mcg Intravenous Given 08/11/23 1140)    Mobility non-ambulatory   Pt is A&O x 4 on RA. VSS.

## 2023-08-12 ENCOUNTER — Inpatient Hospital Stay (HOSPITAL_COMMUNITY): Payer: Medicare Other

## 2023-08-12 ENCOUNTER — Inpatient Hospital Stay (HOSPITAL_COMMUNITY): Payer: Medicare Other | Admitting: Certified Registered Nurse Anesthetist

## 2023-08-12 ENCOUNTER — Other Ambulatory Visit: Payer: Self-pay

## 2023-08-12 ENCOUNTER — Encounter (HOSPITAL_COMMUNITY)
Admission: EM | Disposition: A | Discharge status: Home Health | Payer: Self-pay | Source: Home / Self Care | Attending: Family Medicine

## 2023-08-12 ENCOUNTER — Encounter (HOSPITAL_COMMUNITY): Payer: Self-pay | Admitting: Anesthesiology

## 2023-08-12 DIAGNOSIS — Z96641 Presence of right artificial hip joint: Secondary | ICD-10-CM

## 2023-08-12 DIAGNOSIS — S72001A Fracture of unspecified part of neck of right femur, initial encounter for closed fracture: Secondary | ICD-10-CM

## 2023-08-12 HISTORY — PX: TOTAL HIP ARTHROPLASTY: SHX124

## 2023-08-12 LAB — COMPREHENSIVE METABOLIC PANEL
ALT: 19 U/L (ref 0–44)
AST: 22 U/L (ref 15–41)
Albumin: 3.6 g/dL (ref 3.5–5.0)
Alkaline Phosphatase: 55 U/L (ref 38–126)
Anion gap: 9 (ref 5–15)
BUN: 14 mg/dL (ref 8–23)
CO2: 25 mmol/L (ref 22–32)
Calcium: 8.6 mg/dL — ABNORMAL LOW (ref 8.9–10.3)
Chloride: 100 mmol/L (ref 98–111)
Creatinine, Ser: 0.8 mg/dL (ref 0.44–1.00)
GFR, Estimated: >60 mL/min (ref >60)
Glucose, Bld: 137 mg/dL — ABNORMAL HIGH (ref 70–99)
Potassium: 3.9 mmol/L (ref 3.5–5.1)
Sodium: 134 mmol/L — ABNORMAL LOW (ref 135–145)
Total Bilirubin: 0.6 mg/dL (ref 0.3–1.2)
Total Protein: 6.6 g/dL (ref 6.5–8.1)

## 2023-08-12 LAB — TYPE AND SCREEN
ABO/RH(D): O POS
Antibody Screen: NEGATIVE

## 2023-08-12 LAB — ABO/RH: ABO/RH(D): O POS

## 2023-08-12 LAB — HEMOGLOBIN A1C
Hgb A1c MFr Bld: 5.8 % — ABNORMAL HIGH (ref 4.8–5.6)
Mean Plasma Glucose: 119.76 mg/dL

## 2023-08-12 SURGERY — ARTHROPLASTY, HIP, TOTAL, ANTERIOR APPROACH
Anesthesia: General | Site: Hip | Laterality: Right

## 2023-08-12 MED ORDER — 0.9 % SODIUM CHLORIDE (POUR BTL) OPTIME
TOPICAL | Status: DC | PRN
  Administered 2023-08-12: 1000 mL

## 2023-08-12 MED ORDER — POLYETHYLENE GLYCOL 3350 17 G PO PACK
17.0000 g | PACK | Freq: Two times a day (BID) | ORAL | Status: DC
  Administered 2023-08-12 – 2023-08-13 (×2): 17 g via ORAL
  Filled 2023-08-12 (×2): qty 1

## 2023-08-12 MED ORDER — METHOCARBAMOL 500 MG IVPB - SIMPLE MED
INTRAVENOUS | Status: AC
  Administered 2023-08-12: 500 mg
  Filled 2023-08-12: qty 55

## 2023-08-12 MED ORDER — PROMETHAZINE HCL 25 MG/ML IJ SOLN: 6.2500 mg | INTRAMUSCULAR | Status: DC | PRN

## 2023-08-12 MED ORDER — CEFAZOLIN SODIUM-DEXTROSE 2-4 GM/100ML-% IV SOLN
2.0000 g | INTRAVENOUS | Status: AC
  Administered 2023-08-12: 2 g via INTRAVENOUS
  Filled 2023-08-12: qty 100

## 2023-08-12 MED ORDER — CEFAZOLIN SODIUM-DEXTROSE 2-4 GM/100ML-% IV SOLN
2.0000 g | Freq: Four times a day (QID) | INTRAVENOUS | Status: AC
  Administered 2023-08-13 (×2): 2 g via INTRAVENOUS
  Filled 2023-08-12 (×2): qty 100

## 2023-08-12 MED ORDER — STERILE WATER FOR IRRIGATION IR SOLN
Status: DC | PRN
  Administered 2023-08-12: 2000 mL

## 2023-08-12 MED ORDER — PROPOFOL 10 MG/ML IV BOLUS
INTRAVENOUS | Status: DC | PRN
  Administered 2023-08-12: 100 mg via INTRAVENOUS

## 2023-08-12 MED ORDER — MEPERIDINE HCL 50 MG/ML IJ SOLN: 6.2500 mg | INTRAMUSCULAR | Status: DC | PRN

## 2023-08-12 MED ORDER — TRANEXAMIC ACID-NACL 1000-0.7 MG/100ML-% IV SOLN
1000.0000 mg | Freq: Once | INTRAVENOUS | Status: AC
  Administered 2023-08-12: 1000 mg via INTRAVENOUS
  Filled 2023-08-12: qty 100

## 2023-08-12 MED ORDER — METHOCARBAMOL 500 MG PO TABS
500.0000 mg | ORAL_TABLET | Freq: Four times a day (QID) | ORAL | Status: DC | PRN
  Administered 2023-08-13 – 2023-08-14 (×2): 500 mg via ORAL
  Filled 2023-08-12 (×2): qty 1

## 2023-08-12 MED ORDER — FENTANYL CITRATE (PF) 250 MCG/5ML IJ SOLN
INTRAMUSCULAR | Status: AC
  Filled 2023-08-12: qty 5

## 2023-08-12 MED ORDER — DOCUSATE SODIUM 100 MG PO CAPS
100.0000 mg | ORAL_CAPSULE | Freq: Two times a day (BID) | ORAL | Status: DC
  Administered 2023-08-12 – 2023-08-13 (×2): 100 mg via ORAL
  Filled 2023-08-12 (×2): qty 1

## 2023-08-12 MED ORDER — SODIUM CHLORIDE 0.9 % IV SOLN: INTRAVENOUS | Status: DC

## 2023-08-12 MED ORDER — ONDANSETRON HCL 4 MG/2ML IJ SOLN: 4.0000 mg | Freq: Four times a day (QID) | INTRAMUSCULAR | Status: DC | PRN

## 2023-08-12 MED ORDER — ALUM & MAG HYDROXIDE-SIMETH 200-200-20 MG/5ML PO SUSP: 30.0000 mL | ORAL | Status: DC | PRN

## 2023-08-12 MED ORDER — CHLORHEXIDINE GLUCONATE 4 % EX SOLN: 60.0000 mL | Freq: Once | CUTANEOUS | Status: DC

## 2023-08-12 MED ORDER — DEXAMETHASONE SODIUM PHOSPHATE 10 MG/ML IJ SOLN
10.0000 mg | Freq: Once | INTRAMUSCULAR | Status: AC
  Administered 2023-08-13: 10 mg via INTRAVENOUS
  Filled 2023-08-12: qty 1

## 2023-08-12 MED ORDER — OXYCODONE HCL 5 MG PO TABS
5.0000 mg | ORAL_TABLET | ORAL | Status: DC | PRN
  Administered 2023-08-13: 5 mg via ORAL
  Filled 2023-08-12: qty 2
  Filled 2023-08-12: qty 1

## 2023-08-12 MED ORDER — DEXAMETHASONE SODIUM PHOSPHATE 10 MG/ML IJ SOLN
8.0000 mg | Freq: Once | INTRAMUSCULAR | Status: AC
  Administered 2023-08-12: 8 mg via INTRAVENOUS

## 2023-08-12 MED ORDER — BISACODYL 10 MG RE SUPP: 10.0000 mg | Freq: Every day | RECTAL | Status: DC | PRN

## 2023-08-12 MED ORDER — EPHEDRINE 5 MG/ML INJ
INTRAVENOUS | Status: AC
  Filled 2023-08-12: qty 5

## 2023-08-12 MED ORDER — FENTANYL CITRATE (PF) 100 MCG/2ML IJ SOLN
INTRAMUSCULAR | Status: DC | PRN
  Administered 2023-08-12 (×2): 50 ug via INTRAVENOUS
  Administered 2023-08-12: 100 ug via INTRAVENOUS

## 2023-08-12 MED ORDER — MENTHOL 3 MG MT LOZG: 1.0000 | LOZENGE | OROMUCOSAL | Status: DC | PRN

## 2023-08-12 MED ORDER — ADULT MULTIVITAMIN W/MINERALS CH
1.0000 | ORAL_TABLET | Freq: Every day | ORAL | Status: DC
  Administered 2023-08-13 – 2023-08-14 (×2): 1 via ORAL
  Filled 2023-08-12 (×2): qty 1

## 2023-08-12 MED ORDER — METHOCARBAMOL 1000 MG/10ML IJ SOLN: 500.0000 mg | Freq: Four times a day (QID) | INTRAVENOUS | Status: DC | PRN

## 2023-08-12 MED ORDER — ENSURE ENLIVE PO LIQD: 237.0000 mL | Freq: Two times a day (BID) | ORAL | Status: DC

## 2023-08-12 MED ORDER — ENSURE ENLIVE PO LIQD
237.0000 mL | Freq: Two times a day (BID) | ORAL | Status: DC
  Administered 2023-08-13 – 2023-08-14 (×4): 237 mL via ORAL

## 2023-08-12 MED ORDER — ONDANSETRON HCL 4 MG PO TABS: 4.0000 mg | ORAL_TABLET | Freq: Four times a day (QID) | ORAL | Status: DC | PRN

## 2023-08-12 MED ORDER — TRANEXAMIC ACID-NACL 1000-0.7 MG/100ML-% IV SOLN
1000.0000 mg | INTRAVENOUS | Status: AC
  Administered 2023-08-12: 1000 mg via INTRAVENOUS
  Filled 2023-08-12: qty 100

## 2023-08-12 MED ORDER — ROCURONIUM BROMIDE 100 MG/10ML IV SOLN
INTRAVENOUS | Status: DC | PRN
  Administered 2023-08-12: 60 mg via INTRAVENOUS

## 2023-08-12 MED ORDER — PHENOL 1.4 % MT LIQD: 1.0000 | OROMUCOSAL | Status: DC | PRN

## 2023-08-12 MED ORDER — LIDOCAINE 2% (20 MG/ML) 5 ML SYRINGE
INTRAMUSCULAR | Status: DC | PRN
  Administered 2023-08-12: 80 mg via INTRAVENOUS

## 2023-08-12 MED ORDER — OXYCODONE HCL 5 MG PO TABS
10.0000 mg | ORAL_TABLET | ORAL | Status: DC | PRN
  Administered 2023-08-12 – 2023-08-14 (×5): 10 mg via ORAL
  Filled 2023-08-12 (×4): qty 2

## 2023-08-12 MED ORDER — ASPIRIN 81 MG PO CHEW
81.0000 mg | CHEWABLE_TABLET | Freq: Two times a day (BID) | ORAL | Status: DC
  Administered 2023-08-12 – 2023-08-14 (×4): 81 mg via ORAL
  Filled 2023-08-12 (×4): qty 1

## 2023-08-12 MED ORDER — HYDROMORPHONE HCL 1 MG/ML IJ SOLN: 0.2500 mg | INTRAMUSCULAR | Status: DC | PRN

## 2023-08-12 MED ORDER — METOCLOPRAMIDE HCL 5 MG PO TABS: 5.0000 mg | ORAL_TABLET | Freq: Three times a day (TID) | ORAL | Status: DC | PRN

## 2023-08-12 MED ORDER — MIDAZOLAM HCL 5 MG/5ML IJ SOLN
INTRAMUSCULAR | Status: DC | PRN
  Administered 2023-08-12: 2 mg via INTRAVENOUS

## 2023-08-12 MED ORDER — SUGAMMADEX SODIUM 200 MG/2ML IV SOLN
INTRAVENOUS | Status: DC | PRN
  Administered 2023-08-12: 180 mg via INTRAVENOUS

## 2023-08-12 MED ORDER — KETOROLAC TROMETHAMINE 30 MG/ML IJ SOLN
INTRAMUSCULAR | Status: DC | PRN
  Administered 2023-08-12: 30 mg

## 2023-08-12 MED ORDER — POVIDONE-IODINE 10 % EX SWAB: 2.0000 | Freq: Once | CUTANEOUS | Status: DC

## 2023-08-12 MED ORDER — SODIUM CHLORIDE (PF) 0.9 % IJ SOLN
INTRAMUSCULAR | Status: DC | PRN
  Administered 2023-08-12: 30 mL

## 2023-08-12 MED ORDER — DIPHENHYDRAMINE HCL 12.5 MG/5ML PO ELIX: 12.5000 mg | ORAL_SOLUTION | ORAL | Status: DC | PRN

## 2023-08-12 MED ORDER — ACETAMINOPHEN 500 MG PO TABS
1000.0000 mg | ORAL_TABLET | Freq: Four times a day (QID) | ORAL | Status: DC
  Administered 2023-08-13 – 2023-08-14 (×6): 1000 mg via ORAL
  Filled 2023-08-12 (×6): qty 2

## 2023-08-12 MED ORDER — LACTATED RINGERS IV SOLN: INTRAVENOUS | Status: DC | PRN

## 2023-08-12 MED ORDER — BUPIVACAINE-EPINEPHRINE (PF) 0.25% -1:200000 IJ SOLN
INTRAMUSCULAR | Status: DC | PRN
  Administered 2023-08-12: 30 mL via PERINEURAL

## 2023-08-12 MED ORDER — METOCLOPRAMIDE HCL 5 MG/ML IJ SOLN: 5.0000 mg | Freq: Three times a day (TID) | INTRAMUSCULAR | Status: DC | PRN

## 2023-08-12 MED ORDER — KETOROLAC TROMETHAMINE 30 MG/ML IJ SOLN
INTRAMUSCULAR | Status: AC
  Filled 2023-08-12: qty 1

## 2023-08-12 MED ORDER — DEXAMETHASONE SODIUM PHOSPHATE 10 MG/ML IJ SOLN
INTRAMUSCULAR | Status: AC
  Filled 2023-08-12: qty 1

## 2023-08-12 MED ORDER — SODIUM CHLORIDE (PF) 0.9 % IJ SOLN
INTRAMUSCULAR | Status: AC
  Filled 2023-08-12: qty 50

## 2023-08-12 MED ORDER — BUPIVACAINE-EPINEPHRINE 0.25% -1:200000 IJ SOLN
INTRAMUSCULAR | Status: AC
  Filled 2023-08-12: qty 1

## 2023-08-12 MED ORDER — MIDAZOLAM HCL 2 MG/2ML IJ SOLN
INTRAMUSCULAR | Status: AC
  Filled 2023-08-12: qty 2

## 2023-08-12 MED ORDER — DROPERIDOL 2.5 MG/ML IJ SOLN: 0.6250 mg | Freq: Once | INTRAMUSCULAR | Status: DC | PRN

## 2023-08-12 MED ORDER — EPHEDRINE SULFATE-NACL 50-0.9 MG/10ML-% IV SOSY
PREFILLED_SYRINGE | INTRAVENOUS | Status: DC | PRN
  Administered 2023-08-12: 5 mg via INTRAVENOUS

## 2023-08-12 MED ORDER — HYDROMORPHONE HCL 1 MG/ML IJ SOLN: 0.5000 mg | INTRAMUSCULAR | Status: DC | PRN

## 2023-08-12 SURGICAL SUPPLY — 41 items
ADH SKN CLS APL DERMABOND .7 (GAUZE/BANDAGES/DRESSINGS) ×1
BAG COUNTER SPONGE SURGICOUNT (BAG) IMPLANT
BAG SPEC THK2 15X12 ZIP CLS (MISCELLANEOUS)
BAG SPNG CNTER NS LX DISP (BAG)
BAG ZIPLOCK 12X15 (MISCELLANEOUS) IMPLANT
BLADE SAG 18X100X1.27 (BLADE) ×2 IMPLANT
COVER PERINEAL POST (MISCELLANEOUS) ×2 IMPLANT
COVER SURGICAL LIGHT HANDLE (MISCELLANEOUS) ×2 IMPLANT
CUP ACETBLR 52 OD PINNACLE (Hips) IMPLANT
DERMABOND ADVANCED .7 DNX12 (GAUZE/BANDAGES/DRESSINGS) ×2 IMPLANT
DRAPE FOOT SWITCH (DRAPES) ×2 IMPLANT
DRAPE STERI IOBAN 125X83 (DRAPES) ×2 IMPLANT
DRAPE U-SHAPE 47X51 STRL (DRAPES) ×4 IMPLANT
DRESSING AQUACEL AG SP 3.5X10 (GAUZE/BANDAGES/DRESSINGS) ×2 IMPLANT
DRSG AQUACEL AG SP 3.5X10 (GAUZE/BANDAGES/DRESSINGS) ×1
DURAPREP 26ML APPLICATOR (WOUND CARE) ×2 IMPLANT
ELECT REM PT RETURN 15FT ADLT (MISCELLANEOUS) ×2 IMPLANT
GLOVE BIO SURGEON STRL SZ 6 (GLOVE) ×2 IMPLANT
GLOVE BIOGEL PI IND STRL 6.5 (GLOVE) ×2 IMPLANT
GLOVE BIOGEL PI IND STRL 7.5 (GLOVE) ×2 IMPLANT
GLOVE ORTHO TXT STRL SZ7.5 (GLOVE) ×4 IMPLANT
GOWN STRL REUS W/ TWL LRG LVL3 (GOWN DISPOSABLE) ×4 IMPLANT
GOWN STRL REUS W/TWL LRG LVL3 (GOWN DISPOSABLE) ×2
HEAD CERAMIC DELTA 36 PLUS 1.5 (Hips) IMPLANT
HOLDER FOLEY CATH W/STRAP (MISCELLANEOUS) ×2 IMPLANT
KIT TURNOVER KIT A (KITS) IMPLANT
LINER NEUTRAL 52X36MM PLUS 4 (Liner) IMPLANT
NDL SAFETY ECLIP 18X1.5 (MISCELLANEOUS) IMPLANT
PACK ANTERIOR HIP CUSTOM (KITS) ×2 IMPLANT
SCREW 6.5MMX30MM (Screw) IMPLANT
STEM FEM ACTIS HIGH SZ3 (Stem) IMPLANT
SUT MNCRL AB 4-0 PS2 18 (SUTURE) ×2 IMPLANT
SUT STRATAFIX 0 PDS 27 VIOLET (SUTURE) ×1
SUT VIC AB 1 CT1 36 (SUTURE) ×6 IMPLANT
SUT VIC AB 2-0 CT1 27 (SUTURE) ×2
SUT VIC AB 2-0 CT1 TAPERPNT 27 (SUTURE) ×4 IMPLANT
SUTURE STRATFX 0 PDS 27 VIOLET (SUTURE) ×2 IMPLANT
SYR 3ML LL SCALE MARK (SYRINGE) IMPLANT
TRAY FOLEY MTR SLVR 16FR STAT (SET/KITS/TRAYS/PACK) IMPLANT
TUBE SUCTION HIGH CAP CLEAR NV (SUCTIONS) ×2 IMPLANT
WATER STERILE IRR 1000ML POUR (IV SOLUTION) ×2 IMPLANT

## 2023-08-12 NOTE — Anesthesia Procedure Notes (Addendum)
Procedure Name: Intubation Date/Time: 08/12/2023 5:36 PM  Performed by: Thomasene Ripple, CRNAPre-anesthesia Checklist: Patient identified, Emergency Drugs available, Suction available and Patient being monitored Patient Re-evaluated:Patient Re-evaluated prior to induction Oxygen Delivery Method: Circle System Utilized Preoxygenation: Pre-oxygenation with 100% oxygen Induction Type: IV induction Ventilation: Mask ventilation without difficulty Laryngoscope Size: Miller and 2 Grade View: Grade II Tube type: Oral Tube size: 7.0 mm Number of attempts: 1 Airway Equipment and Method: Stylet and Oral airway Placement Confirmation: ETT inserted through vocal cords under direct vision, positive ETCO2 and breath sounds checked- equal and bilateral Secured at: 21 cm Tube secured with: Tape Dental Injury: Teeth and Oropharynx as per pre-operative assessment

## 2023-08-12 NOTE — Progress Notes (Signed)
PROGRESS NOTE  Melissa Mcdowell  DOB: 03-04-1951  PCP: Charlton Amor, DO OZH:[id number]  DOA: 08/11/2023  LOS: 1 day  Hospital Day: 2  Brief narrative: Melissa Mcdowell is a 72 y.o. female with PMH significant for HLD, GERD, anemia, osteoarthritis 9/23, patient was brought to the ED from home after a fall. Patient reports she was doing her dishes when she slipped and fell hitting the dishwasher door sustaining immediate right shoulder pain and deformity.  She did not hit her head or pass out.  She is not on any blood thinner.  In the ED, patient was afebrile, vital signs mostly stable Labs mostly stable Chest unremarkable Right hip x-ray showed mildly displaced transverse fracture of the right femoral neck. Right shoulder x-ray showed right humeral surgical neck fracture.     Subjective: Patient was seen and examined this morning.  Pleasant elderly Caucasian female.  Lying on bed.  Not in distress.  Pending surgery today. Remains hemodynamically stable Labs sodium 134  Assessment and plan: Closed right hip fracture Closed right humerus fracture Fractures secondary to mechanical fall  Orthopedics consulted  Tentative plan of surgical fixation today  Pain management with Tylenol as needed, oxycodone as needed, Dilaudid IV as needed DVT prophylaxis with SCDs.  Plan to start Lovenox after surgery  Prediabetes Blood sugar level was mildly elevated.  No history of diabetes.  A1c controlled at 5.8. Lifestyle modifications recommended  Hyperlipidemia Continue Zetia 10 mg daily   Mobility: Needs PT eval postprocedure  Goals of care   Code Status: Full Code     DVT prophylaxis:  SCDs Start: 08/11/23 1235   Antimicrobials: None Fluid: Start NS at 75 mL/h Consultants: Orthopedics Family Communication: None at bedside  Status: Inpatient Level of care:  Med-Surg   Patient is from: Home Needs to continue in-hospital care: Pending OR Anticipated d/c to: Pending  clinical course, PT eval      Diet:  Diet Order             Diet NPO time specified  Diet effective midnight                   Scheduled Meds:  mupirocin ointment  1 Application Nasal BID   senna-docusate  1 tablet Oral BID    PRN meds: acetaminophen **OR** acetaminophen, HYDROmorphone (DILAUDID) injection, ondansetron **OR** ondansetron (ZOFRAN) IV, oxyCODONE   Infusions:   sodium chloride      Antimicrobials: Anti-infectives (From admission, onward)    None       Objective: Vitals:   08/12/23 0944 08/12/23 1216  BP: 134/67   Pulse: 79   Resp: 16   Temp: 98.1 F (36.7 C)   SpO2: (!) 84% 96%    Intake/Output Summary (Last 24 hours) at 08/12/2023 1354 Last data filed at 08/12/2023 0348 Gross per 24 hour  Intake 480 ml  Output 460 ml  Net 20 ml   Filed Weights   08/11/23 0819  Weight: 77.1 kg   Weight change:  Body mass index is 30.11 kg/m.   Physical Exam: General exam: Pleasant, elderly Caucasian female.  Pain controlled at rest Skin: No rashes, lesions or ulcers. HEENT: Atraumatic, normocephalic, no obvious bleeding Lungs: Clear to auscultation bilaterally CVS: Regular rate and rhythm, no murmur GI/Abd soft, nontender, nondistended, bowel sound present CNS: Alert, awake, oriented x 3 Psychiatry: Mood appropriate Extremities: No pedal edema, no calf tenderness.  Right lower extremity on traction and right upper EXTR on sling  Data  Review: I have personally reviewed the laboratory data and studies available.  F/u labs ordered Unresulted Labs (From admission, onward)     Start     Ordered   08/13/23 0500  CBC with Differential/Platelet  Daily,   R      08/12/23 1353   08/13/23 0500  Basic metabolic panel  Daily,   R      08/12/23 1353   08/11/23 0828  ABO/Rh  Once,   R        08/11/23 0828            Total time spent in review of labs and imaging, patient evaluation, formulation of plan, documentation and communication with  family: 45 minutes  Signed, Lorin Glass, MD [facility] 08/12/2023

## 2023-08-12 NOTE — Progress Notes (Signed)
Report given to short stay RN. Pt off the floor for surgery.

## 2023-08-12 NOTE — Progress Notes (Signed)
Initial Nutrition Assessment  DOCUMENTATION CODES:   Not applicable  INTERVENTION:   When diet resumed, begin: Ensure Plus High Protein po BID, each supplement provides 350 kcal and 20 grams of protein. MVI with minerals daily.  NUTRITION DIAGNOSIS:   Increased nutrient needs related to post-op healing as evidenced by estimated needs.  GOAL:   Patient will meet greater than or equal to 90% of their needs  MONITOR:   PO intake, Supplement acceptance  REASON FOR ASSESSMENT:   Consult Hip fracture protocol  ASSESSMENT:   72 yo female admitted with R hip fracture and R humeral neck fracture S/P ground level fall. PMH includes GERD, anemia, osteoarthritis, HLD.  Unable to speak with patient at this time or complete nutrition focused physical exam. She is currently NPO for surgery today.  On admission nutrition screen, patient reported no weight loss and good appetite PTA.  Labs reviewed. Na 134, A1C 5.8  Medications reviewed and include Senokot-S. IVF: NS at 75 ml/h  Weight history reviewed. No recent weight changes noted.  NUTRITION - FOCUSED PHYSICAL EXAM:  Unable to complete  Diet Order:   Diet Order             Diet NPO time specified Except for: Sips with Meds  Diet effective now                   EDUCATION NEEDS:   No education needs have been identified at this time  Skin:  Skin Assessment: Reviewed RN Assessment  Last BM:  9/23  Height:   Ht Readings from Last 1 Encounters:  08/11/23 5\' 3"  (1.6 m)    Weight:   Wt Readings from Last 1 Encounters:  08/11/23 77.1 kg    Ideal Body Weight:  52.3 kg  BMI:  Body mass index is 30.11 kg/m.  Estimated Nutritional Needs:   Kcal:  1500-1700  Protein:  80-90 gm  Fluid:  >/= 1.5 L   Melissa Mcdowell RD, LDN, CNSC Please refer to Amion for contact information.

## 2023-08-12 NOTE — Plan of Care (Signed)
Problem: Coping: Goal: Level of anxiety will decrease Outcome: Progressing   Problem: Pain Managment: Goal: General experience of comfort will improve Outcome: Progressing

## 2023-08-12 NOTE — Anesthesia Preprocedure Evaluation (Addendum)
Anesthesia Evaluation  Patient identified by MRN, date of birth, ID band Patient awake    Reviewed: Allergy & Precautions, NPO status , Patient's Chart, lab work & pertinent test results  History of Anesthesia Complications (+) history of anesthetic complications  Airway Mallampati: III  TM Distance: >3 FB Neck ROM: Full    Dental no notable dental hx. (+) Dental Advisory Given, Teeth Intact   Pulmonary neg pulmonary ROS   Pulmonary exam normal breath sounds clear to auscultation       Cardiovascular negative cardio ROS Normal cardiovascular exam Rhythm:Regular Rate:Normal     Neuro/Psych  Headaches  negative psych ROS   GI/Hepatic Neg liver ROS,GERD  ,,  Endo/Other  negative endocrine ROS    Renal/GU negative Renal ROS     Musculoskeletal  (+) Arthritis ,    Abdominal   Peds  Hematology  (+) Blood dyscrasia, anemia   Anesthesia Other Findings   Reproductive/Obstetrics negative OB ROS                             Anesthesia Physical Anesthesia Plan  ASA: 3  Anesthesia Plan: General   Post-op Pain Management: Ofirmev IV (intra-op)*   Induction: Intravenous  PONV Risk Score and Plan: 2 and Dexamethasone, Ondansetron, Midazolam and Treatment may vary due to age or medical condition  Airway Management Planned: Oral ETT  Additional Equipment:   Intra-op Plan:   Post-operative Plan: Extubation in OR  Informed Consent: I have reviewed the patients History and Physical, chart, labs and discussed the procedure including the risks, benefits and alternatives for the proposed anesthesia with the patient or authorized representative who has indicated his/her understanding and acceptance.     Dental advisory given  Plan Discussed with: CRNA  Anesthesia Plan Comments: (Pt requests GA)        Anesthesia Quick Evaluation

## 2023-08-12 NOTE — Op Note (Signed)
NAME:  Melissa Mcdowell                ACCOUNT NO.: [id number]      MEDICAL RECORD NO.: [id number]      FACILITY:  [facility]      PHYSICIAN:  Shelda Pal  DATE OF BIRTH:  01/06/1951     DATE OF PROCEDURE:  08/12/2023                                 OPERATIVE REPORT         PREOPERATIVE DIAGNOSIS: Right  hip femoral neck fracture.      POSTOPERATIVE DIAGNOSIS:  Right hip femoral neck fracture      PROCEDURE:  Right total hip replacement through an anterior approach   utilizing DePuy THR system, component size 52 mm pinnacle cup, a size 36+4 neutral   Altrex liner, a size 3 Hi Actis stem with a 36+1.5 delta ceramic   ball.      SURGEON:  Madlyn Frankel. Charlann Boxer, M.D.      ASSISTANT:  Rosalene Billings, PA-C     ANESTHESIA:  General.      SPECIMENS:  None.      COMPLICATIONS:  None.      BLOOD LOSS:  400 cc     DRAINS:  None.      INDICATION OF THE PROCEDURE:  Melissa Mcdowell is a 72 y.o. female who unfortunately had a ground level fall at her home on to her right side.  Due to her pain level and inability to bear weight she presented to the ER via EMS.  Evaluation in ER revealed right hip femoral neck fracture and a right proximal humerus fracture.  She was admitted to the hospital through the hospitalist service.  We were consulted for management of both the right hip and the right shoulder.  Based on the nature of her right femoral neck fracture I recommended that she proceed with right total hip replacement for definitive management.  The necessity of the procedure was reviewed as well as the other options and their associated risks.  We reviewed the risks of infection DVT dislocation neurovascular injury and the need for future surgeries.  Consent was obtained for management of her right hip fracture.     PROCEDURE IN DETAIL:  The patient was brought to operative theater.   Once adequate anesthesia, preoperative antibiotics, 2 gm Ancef, 1 gm of Tranexamic  Acid, and 10 mg of Decadron were administered, the patient was positioned supine on the [employer] table.  Care was taken during positioning to make certain that her right shoulder remained in a stable neutral resting position.  Once the patient was safely positioned with adequate padding of boney prominences we predraped out the hip, and used fluoroscopy to confirm orientation of the pelvis.      The right hip was then prepped and draped from proximal iliac crest to   mid thigh with a shower curtain technique.      Time-out was performed identifying the patient, planned procedure, and the appropriate extremity.     An incision was then made 2 cm lateral to the   anterior superior iliac spine extending over the orientation of the   tensor fascia lata muscle and sharp dissection was carried down to the   fascia of the muscle.      The fascia was then incised.  The muscle belly was identified and swept   laterally and retractor placed along the superior neck.  Following   cauterization of the circumflex vessels and removing some pericapsular   fat, a second cobra retractor was placed on the inferior neck.  A T-capsulotomy was made along the line of the   superior neck to the trochanteric fossa, then extended proximally and   distally.  Tag sutures were placed and the retractors were then placed   intracapsular.  We then identified the trochanteric fossa and   orientation of my neck cut and then made a neck osteotomy with the femur on traction.  The femoral   head was removed without difficulty or complication.  Traction was let   off and retractors were placed posterior and anterior around the   acetabulum.      The labrum and foveal tissue were debrided.  I began reaming with a 45 mm   reamer and reamed up to 51 mm reamer with good bony bed preparation and a 52 mm  cup was chosen.  The final 52 mm Pinnacle cup was then impacted under fluoroscopy to confirm the depth of penetration and  orientation with respect to   Abduction and forward flexion.  A screw was placed into the ilium followed by the hole eliminator.  The final   36+4 neutral Altrex liner was impacted with good visualized rim fit.  The cup was positioned anatomically within the acetabular portion of the pelvis.      At this point, the femur was rolled to 100 degrees.  Further capsule was   released off the inferior aspect of the femoral neck.  I then   released the superior capsule proximally.  With the leg in a neutral position the hook was placed laterally   along the femur under the vastus lateralis origin and elevated manually and then held in position using the hook attachment on the bed.  The leg was then extended and adducted with the leg rolled to 100   degrees of external rotation.  Retractors were placed along the medial calcar and posteriorly over the greater trochanter.  Once the proximal femur was fully   exposed, I used a box osteotome to set orientation.  I then began   broaching with the starting chili pepper broach and passed this by hand and then broached up to 3.  With the 3 broach in place I chose a high offset neck and did several trial reductions.  The offset was appropriate, leg lengths   appeared to be equal best matched with the +1.5 head ball trial confirmed radiographically.   Given these findings, I went ahead and dislocated the hip, repositioned all   retractors and positioned the right hip in the extended and abducted position.  The final 3 Hi Actis stem was   chosen and it was impacted down to the level of neck cut.  Based on this   and the trial reductions, a final 36+1.5 delta ceramic ball was chosen and   impacted onto a clean and dry trunnion, and the hip was reduced.  The   hip had been irrigated throughout the case again at this point.  I did   reapproximate the superior capsular leaflet to the anterior leaflet   using #1 Vicryl.  The fascia of the   tensor fascia lata muscle  was then reapproximated using #1 Vicryl and #0 Stratafix sutures.  The   remaining wound was closed with 2-0 Vicryl and  running 4-0 Monocryl.   The hip was cleaned, dried, and dressed sterilely using Dermabond and   Aquacel dressing.  The patient was then brought   to recovery room in stable condition tolerating the procedure well.    Rosalene Billings, PA-C was present for the entirety of the case involved from   preoperative positioning, perioperative retractor management, general   facilitation of the case, as well as primary wound closure as assistant.            Madlyn Frankel Charlann Boxer, M.D.        08/12/2023 5:25 PM

## 2023-08-12 NOTE — Interval H&P Note (Signed)
History and Physical Interval Note:  08/12/2023 3:22 PM  Karen Kays  has presented today for surgery, with the diagnosis of RIGHT FEMORAL NECK FRACTURE.  The various methods of treatment have been discussed with the patient and family. After consideration of risks, benefits and other options for treatment, the patient has consented to  Procedure(s): TOTAL HIP ARTHROPLASTY ANTERIOR APPROACH (Right) as a surgical intervention.  The patient's history has been reviewed, patient examined, no change in status, stable for surgery.  I have reviewed the patient's chart and labs.  Questions were answered to the patient's satisfaction.     Shelda Pal

## 2023-08-12 NOTE — Transfer of Care (Signed)
Immediate Anesthesia Transfer of Care Note  Patient: Melissa Mcdowell  Procedure(s) Performed: TOTAL HIP ARTHROPLASTY ANTERIOR APPROACH (Right: Hip)  Patient Location: PACU  Anesthesia Type:General  Level of Consciousness: awake, drowsy, and patient cooperative  Airway & Oxygen Therapy: Patient Spontanous Breathing and Patient connected to face mask oxygen  Post-op Assessment: Report given to RN, Post -op Vital signs reviewed and stable, and Patient moving all extremities  Post vital signs: Reviewed and stable  Last Vitals:  Vitals Value Taken Time  BP 141/73 08/12/23 1915  Temp    Pulse 74 08/12/23 1916  Resp 14 08/12/23 1916  SpO2 100 % 08/12/23 1916  Vitals shown include unfiled device data.  Last Pain:  Vitals:   08/12/23 1302  TempSrc:   PainSc: 2       Patients Stated Pain Goal: 0 (08/12/23 1232)  Complications: No notable events documented.

## 2023-08-12 NOTE — Progress Notes (Signed)
Patient ID: Melissa Mcdowell, female   DOB: [birth year], 72 y.o.   MRN: [id number]  Patient with right proximal humerus fracture and right hip femoral neck fracture.  Reviewed the plans for treating her right hip fracture with a THR She was made NPO Orders placed  To OR today for right total hip replacement I will review the injury pattern of her right shoulder fracture with Dr. Aundria Rud to confirm that non-operative management is appropriate

## 2023-08-13 ENCOUNTER — Encounter (HOSPITAL_COMMUNITY): Payer: Self-pay | Admitting: Orthopedic Surgery

## 2023-08-13 DIAGNOSIS — S72001A Fracture of unspecified part of neck of right femur, initial encounter for closed fracture: Secondary | ICD-10-CM | POA: Diagnosis not present

## 2023-08-13 LAB — BASIC METABOLIC PANEL
Anion gap: 5 (ref 5–15)
BUN: 12 mg/dL (ref 8–23)
CO2: 27 mmol/L (ref 22–32)
Calcium: 8 mg/dL — ABNORMAL LOW (ref 8.9–10.3)
Chloride: 105 mmol/L (ref 98–111)
Creatinine, Ser: 0.72 mg/dL (ref 0.44–1.00)
GFR, Estimated: >60 mL/min (ref >60)
Glucose, Bld: 147 mg/dL — ABNORMAL HIGH (ref 70–99)
Potassium: 4.2 mmol/L (ref 3.5–5.1)
Sodium: 137 mmol/L (ref 135–145)

## 2023-08-13 LAB — CBC WITH DIFFERENTIAL/PLATELET
Abs Immature Granulocytes: 0.04 10*3/uL (ref 0.00–0.07)
Basophils Absolute: 0 10*3/uL (ref 0.0–0.1)
Basophils Relative: 0 %
Eosinophils Absolute: 0 10*3/uL (ref 0.0–0.5)
Eosinophils Relative: 0 %
HCT: 31.7 % — ABNORMAL LOW (ref 36.0–46.0)
Hemoglobin: 10.3 g/dL — ABNORMAL LOW (ref 12.0–15.0)
Immature Granulocytes: 0 %
Lymphocytes Relative: 6 %
Lymphs Abs: 0.5 10*3/uL — ABNORMAL LOW (ref 0.7–4.0)
MCH: 32.1 pg (ref 26.0–34.0)
MCHC: 32.5 g/dL (ref 30.0–36.0)
MCV: 98.8 fL (ref 80.0–100.0)
Monocytes Absolute: 0.5 10*3/uL (ref 0.1–1.0)
Monocytes Relative: 5 %
Neutro Abs: 8.1 10*3/uL — ABNORMAL HIGH (ref 1.7–7.7)
Neutrophils Relative %: 89 %
Platelets: 148 10*3/uL — ABNORMAL LOW (ref 150–400)
RBC: 3.21 MIL/uL — ABNORMAL LOW (ref 3.87–5.11)
RDW: 14.3 % (ref 11.5–15.5)
WBC: 9.2 10*3/uL (ref 4.0–10.5)
nRBC: 0 % (ref 0.0–0.2)

## 2023-08-13 MED ORDER — BISMUTH SUBSALICYLATE 262 MG/15ML PO SUSP
30.0000 mL | ORAL | Status: AC | PRN
  Administered 2023-08-13 – 2023-08-14 (×2): 30 mL via ORAL
  Filled 2023-08-13: qty 236

## 2023-08-13 NOTE — Progress Notes (Signed)
   Subjective: 1 Day Post-Op Procedure(s) (LRB): TOTAL HIP ARTHROPLASTY ANTERIOR APPROACH (Right) Patient reports pain as mild.   Patient seen in rounds with Dr. Charlann Boxer. Patient is resting in bed on exam this morning. No acute events overnight. She has not been up with PT yet.  We will start therapy today.   Objective: Vital signs in last 24 hours: Temp:  [97.7 F (36.5 C)-98.2 F (36.8 C)] 98 F (36.7 C) (09/25 0453) Pulse Rate:  [62-81] 71 (09/25 0453) Resp:  [10-18] 16 (09/25 0453) BP: (116-149)/(66-80) 135/76 (09/25 0453) SpO2:  [84 %-100 %] 98 % (09/25 0453)  Intake/Output from previous day:  Intake/Output Summary (Last 24 hours) at 08/13/2023 0830 Last data filed at 08/13/2023 6387 Gross per 24 hour  Intake 2321.02 ml  Output 2950 ml  Net -628.98 ml     Intake/Output this shift: No intake/output data recorded.  Labs: Recent Labs    08/11/23 1114 08/13/23 0324  HGB 12.6 10.3*   Recent Labs    08/11/23 1114 08/13/23 0324  WBC 10.1 9.2  RBC 3.93 3.21*  HCT 38.3 31.7*  PLT 168 148*   Recent Labs    08/12/23 0333 08/13/23 0324  NA 134* 137  K 3.9 4.2  CL 100 105  CO2 25 27  BUN 14 12  CREATININE 0.80 0.72  GLUCOSE 137* 147*  CALCIUM 8.6* 8.0*   No results for input(s): "LABPT", "INR" in the last 72 hours.  Exam: General - Patient is Alert and Oriented Extremity - Neurologically intact Sensation intact distally Intact pulses distally Dorsiflexion/Plantar flexion intact Dressing - dressing C/D/I Motor Function - intact, moving foot and toes well on exam.   Past Medical History:  Diagnosis Date   Acid reflux    occasional   Anemia    no current med.   Arthritis    lower back, fingers   Complication of anesthesia    states has a small mouth   Dental crowns present    Headache(784.0)    sinus - 3-4 x/month   Hypercholesteremia    Mucous cyst of finger 06/18/2012   left long finger    Assessment/Plan: 1 Day Post-Op Procedure(s)  (LRB): TOTAL HIP ARTHROPLASTY ANTERIOR APPROACH (Right) Principal Problem:   Closed right hip fracture, initial encounter [facility]) Active Problems:   Right humeral fracture   Hyperglycemia   Hypercalcemia   S/P total right hip arthroplasty  Estimated body mass index is 30.11 kg/m as calculated from the following:   Height as of this encounter: 5\' 3"  (1.6 m).   Weight as of this encounter: 77.1 kg. Advance diet Up with therapy D/C IV fluids  DVT Prophylaxis - Aspirin Weight bearing as tolerated RLE NWB in sling RUE  Hgb stable at 10.3 this AM.  Plan to get up with PT today to begin practicing with likely left sided platform walker.  Dr. Charlann Boxer discussed right humerus fracture with Dr. Aundria Rud who recommends non-op treatment. She will follow up with him for this.   Plan for discharge home once she is meeting goals with PT. She should not require SNF placement, as she is very active at baseline with good family support.   Rosalene Billings, PA-C Orthopedic Surgery [phone] 08/13/2023, 8:30 AM

## 2023-08-13 NOTE — Progress Notes (Signed)
Physical Therapy Treatment Patient Details Name: Melissa Mcdowell MRN: [id number] DOB: 1951-04-22 Today's Date: 08/13/2023   History of Present Illness Pt is a 72 y/o F admitted on 08/11/23 after presenting with c/o a fall. Pt found to have R mildly displaced transverse fracture of the right femoral neck & R humeral surgical neck fracture. Pt underwent R THA anterior approach. Pt's R shoulder will be treated non-operatively, NWB in a sling. PMH: HLD, GERD, anemia, OA, hypercholesterolemia    PT Comments  Pt seen for PT tx with pt agreeable, noting only mild soreness in R hip. Pt is able to increase gait distances on this date with CGA fade to close supervision. Pt initiated gait training with small based quad cane with min assist<>CGA with seated rest break between trials. Pt would benefit from further stair training tomorrow but is making really good progress overall.   If plan is discharge home, recommend the following: A little help with walking and/or transfers;Direct supervision/assist for financial management;A little help with bathing/dressing/bathroom;Assistance with cooking/housework;Assist for transportation;Help with stairs or ramp for entrance   Can travel by private vehicle        Equipment Recommendations  BSC/3in1 (small based quad cane)    Recommendations for Other Services       Precautions / Restrictions Precautions Precautions: Fall;Anterior Hip Required Braces or Orthoses: Sling Restrictions Weight Bearing Restrictions: Yes RUE Weight Bearing: Non weight bearing (in sling) RLE Weight Bearing: Weight bearing as tolerated     Mobility  Bed Mobility Overal bed mobility: Needs Assistance Bed Mobility: Supine to Sit     Supine to sit: Supervision, HOB elevated, Used rails (HOB minimally elevated)     General bed mobility comments: exits R side of bed    Transfers Overall transfer level: Needs assistance Equipment used: Quad cane Transfers: Sit to/from  Stand Sit to Stand: Contact guard assist, Supervision           General transfer comment: education re: hand placement to push to standing    Ambulation/Gait Ambulation/Gait assistance: Contact guard assist, Supervision Gait Distance (Feet): 100 Feet Assistive device: Quad cane Gait Pattern/deviations: Decreased weight shift to right, Decreased stance time - right Gait velocity: decreased     General Gait Details: Improved activity tolerance during gait.   Stairs Stairs: Yes Stairs assistance: Min assist, Contact guard assist Stair Management: No rails, With cane, Step to pattern Number of Stairs: 3 (+ 3 (6")) General stair comments: PT provides demonstration & pt able to negotiate 3 steps (6") x 2 sets with seated rest between. Pt requires min cuing to recall compensatory pattern.   Wheelchair Mobility     Tilt Bed    Modified Rankin (Stroke Patients Only)       Balance Overall balance assessment: Needs assistance Sitting-balance support: Feet supported Sitting balance-Leahy Scale: Good     Standing balance support: During functional activity, Single extremity supported Standing balance-Leahy Scale: Fair                              Cognition Arousal: Alert Behavior During Therapy: WFL for tasks assessed/performed Overall Cognitive Status: Within Functional Limits for tasks assessed                                          Exercises Total Joint Exercises Ankle Circles/Pumps: AROM, Supine, Right,  10 reps Quad Sets: AROM, Supine, Strengthening, Right, 10 reps Short Arc Quad: AROM, Supine, Strengthening, Right, 10 reps Heel Slides: Supine, AROM, AAROM, Strengthening, Right, 10 reps Hip ABduction/ADduction: AROM, Supine, AAROM, Strengthening, Right, 10 reps Long Arc Quad: AROM, Seated, Strengthening, Right, 10 reps    General Comments General comments (skin integrity, edema, etc.): Pt with improving overall activity  tolerance. PT provided written instructions re: stair negotiation with QC as well as demonstration & written instructions for stair negotiation laterally with R rail.      Pertinent Vitals/Pain Pain Assessment Pain Assessment: Faces Pain Score: 3  Faces Pain Scale: Hurts a little bit Pain Location: R hip Pain Descriptors / Indicators: Sore Pain Intervention(s): Monitored during session    Home Living                          Prior Function            PT Goals (current goals can now be found in the care plan section) Acute Rehab PT Goals Patient Stated Goal: get better, not burden husband PT Goal Formulation: With patient Time For Goal Achievement: 08/27/23 Potential to Achieve Goals: Good Progress towards PT goals: Progressing toward goals    Frequency    Min 1X/week      PT Plan      Co-evaluation              AM-PAC PT "6 Clicks" Mobility   Outcome Measure  Help needed turning from your back to your side while in a flat bed without using bedrails?: None Help needed moving from lying on your back to sitting on the side of a flat bed without using bedrails?: A Little Help needed moving to and from a bed to a chair (including a wheelchair)?: A Little Help needed standing up from a chair using your arms (e.g., wheelchair or bedside chair)?: A Little Help needed to walk in hospital room?: A Little Help needed climbing 3-5 steps with a railing? : A Little 6 Click Score: 19    End of Session Equipment Utilized During Treatment: Gait belt Activity Tolerance: Patient tolerated treatment well Patient left: in chair;with chair alarm set;with call bell/phone within reach Nurse Communication: Mobility status PT Visit Diagnosis: Pain;Muscle weakness (generalized) (M62.81);Other abnormalities of gait and mobility (R26.89);Difficulty in walking, not elsewhere classified (R26.2) Pain - Right/Left: Right Pain - part of body: Hip     Time: 8119-1478 PT  Time Calculation (min) (ACUTE ONLY): 38 min  Charges:    $Gait Training: 8-22 mins $Therapeutic Activity: 23-37 mins PT General Charges $$ ACUTE PT VISIT: 1 Visit                     Aleda Grana, PT, DPT 08/13/23, 3:00 PM   Sandi Mariscal 08/13/2023, 2:57 PM

## 2023-08-13 NOTE — Progress Notes (Signed)
PROGRESS NOTE    Melissa Mcdowell  ZOX:[id number] DOB: 1951/01/05 DOA: 08/11/2023 PCP: Charlton Amor, DO   Brief Narrative:  Melissa Mcdowell is a 72 y.o. female with PMH significant for HLD, GERD, anemia, osteoarthritis 9/23, patient was brought to the ED from home after a fall. Patient reports she was doing her dishes when she slipped and fell hitting the dishwasher door sustaining immediate right shoulder pain and deformity.  She did not hit her head or pass out.  She is not on any blood thinner.   In the ED, patient was afebrile, vital signs mostly stable Labs mostly stable Chest unremarkable Right hip x-ray showed mildly displaced transverse fracture of the right femoral neck. Right shoulder x-ray showed right humeral surgical neck fracture.    Assessment & Plan:   Principal Problem:   Closed right hip fracture, initial encounter [facility]) Active Problems:   Right humeral fracture   Hyperglycemia   Hypercalcemia   S/P total right hip arthroplasty  Closed right hip fracture Closed right humerus fracture Fractures secondary to mechanical fall  Orthopedics consulted.  Underwent total hip arthroplasty anterior approach on 08/12/2023, to be seen by PT OT.  Pain will well-controlled.  DVT prophylaxis and pain management per orthopedics.   Prediabetes Blood sugar level was mildly elevated.  No history of diabetes.  A1c controlled at 5.8. Lifestyle modifications recommended   Hyperlipidemia Continue Zetia 10 mg daily  Acute/postoperative blood loss anemia: Hemoglobin dropped from 12.6 yesterday to 10.3 today.  Monitor closely.  DVT prophylaxis: SCDs Start: 08/12/23 2056 Place TED hose Start: 08/12/23 2056 SCDs Start: 08/11/23 1235   Code Status: Full Code  Family Communication:  None present at bedside.  Plan of care discussed with patient in length and he/she verbalized understanding and agreed with it.  Status is: Inpatient Remains inpatient appropriate because: To be  seen by PT OT, may need SNF discharge.   Estimated body mass index is 30.11 kg/m as calculated from the following:   Height as of this encounter: 5\' 3"  (1.6 m).   Weight as of this encounter: 77.1 kg.    Nutritional Assessment: Body mass index is 30.11 kg/m.Marland Kitchen Seen by dietician.  I agree with the assessment and plan as outlined below: Nutrition Status: Nutrition Problem: Increased nutrient needs Etiology: post-op healing Signs/Symptoms: estimated needs Interventions: Ensure Enlive (each supplement provides 350kcal and 20 grams of protein), MVI  . Skin Assessment: I have examined the patient's skin and I agree with the wound assessment as performed by the wound care RN as outlined below:    Consultants:  Orthopedics  Procedures:  As above  Antimicrobials:  Anti-infectives (From admission, onward)    Start     Dose/Rate Route Frequency Ordered Stop   08/13/23 0000  ceFAZolin (ANCEF) IVPB 2g/100 mL premix        2 g 200 mL/hr over 30 Minutes Intravenous Every 6 hours 08/12/23 2055 08/13/23 0617   08/12/23 1530  ceFAZolin (ANCEF) IVPB 2g/100 mL premix        2 g 200 mL/hr over 30 Minutes Intravenous On call to O.R. 08/12/23 1517 08/12/23 1750         Subjective: Patient seen and examined.  She says that she is feeling a lot better with no complaints.  She feels comfortable.  Objective: Vitals:   08/12/23 2000 08/12/23 2051 08/13/23 0019 08/13/23 0453  BP: (!) 149/68 138/80 116/73 135/76  Pulse: 77 63 62 71  Resp: 15 18 16  16  Temp: 98 F (36.7 C) 98.2 F (36.8 C) 97.7 F (36.5 C) 98 F (36.7 C)  TempSrc:  Oral Oral Oral  SpO2: 100% 99% 100% 98%  Weight:      Height:        Intake/Output Summary (Last 24 hours) at 08/13/2023 0943 Last data filed at 08/13/2023 1610 Gross per 24 hour  Intake 2321.02 ml  Output 2950 ml  Net -628.98 ml   Filed Weights   08/11/23 0819  Weight: 77.1 kg    Examination:  General exam: Appears calm and comfortable   Respiratory system: Clear to auscultation. Respiratory effort normal. Cardiovascular system: S1 & S2 heard, RRR. No JVD, murmurs, rubs, gallops or clicks. No pedal edema. Gastrointestinal system: Abdomen is nondistended, soft and nontender. No organomegaly or masses felt. Normal bowel sounds heard. Central nervous system: Alert and oriented. No focal neurological deficits. Extremities: Symmetric 5 x 5 power. Skin: No rashes, lesions or ulcers Psychiatry: Judgement and insight appear normal. Mood & affect appropriate.    Data Reviewed: I have personally reviewed following labs and imaging studies  CBC: Recent Labs  Lab 08/11/23 1114 08/13/23 0324  WBC 10.1 9.2  NEUTROABS  --  8.1*  HGB 12.6 10.3*  HCT 38.3 31.7*  MCV 97.5 98.8  PLT 168 148*   Basic Metabolic Panel: Recent Labs  Lab 08/11/23 1114 08/12/23 0333 08/13/23 0324  NA 140 134* 137  K 3.9 3.9 4.2  CL 106 100 105  CO2 23 25 27   GLUCOSE 106* 137* 147*  BUN 15 14 12   CREATININE 0.54 0.80 0.72  CALCIUM 8.7* 8.6* 8.0*   GFR: Estimated Creatinine Clearance: 62.5 mL/min (by C-G formula based on SCr of 0.72 mg/dL). Liver Function Tests: Recent Labs  Lab 08/12/23 0333  AST 22  ALT 19  ALKPHOS 55  BILITOT 0.6  PROT 6.6  ALBUMIN 3.6   No results for input(s): "LIPASE", "AMYLASE" in the last 168 hours. No results for input(s): "AMMONIA" in the last 168 hours. Coagulation Profile: No results for input(s): "INR", "PROTIME" in the last 168 hours. Cardiac Enzymes: No results for input(s): "CKTOTAL", "CKMB", "CKMBINDEX", "TROPONINI" in the last 168 hours. BNP (last 3 results) No results for input(s): "PROBNP" in the last 8760 hours. HbA1C: Recent Labs    08/12/23 0500  HGBA1C 5.8*   CBG: No results for input(s): "GLUCAP" in the last 168 hours. Lipid Profile: No results for input(s): "CHOL", "HDL", "LDLCALC", "TRIG", "CHOLHDL", "LDLDIRECT" in the last 72 hours. Thyroid Function Tests: No results for  input(s): "TSH", "T4TOTAL", "FREET4", "T3FREE", "THYROIDAB" in the last 72 hours. Anemia Panel: No results for input(s): "VITAMINB12", "FOLATE", "FERRITIN", "TIBC", "IRON", "RETICCTPCT" in the last 72 hours. Sepsis Labs: No results for input(s): "PROCALCITON", "LATICACIDVEN" in the last 168 hours.  Recent Results (from the past 240 hour(s))  Surgical PCR screen     Status: None   Collection Time: 08/11/23  9:10 PM   Specimen: Nasal Mucosa; Nasal Swab  Result Value Ref Range Status   MRSA, PCR NEGATIVE NEGATIVE Final   Staphylococcus aureus NEGATIVE NEGATIVE Final    Comment: (NOTE) The Xpert SA Assay (FDA approved for NASAL specimens in patients 49 years of age and older), is one component of a comprehensive surveillance program. It is not intended to diagnose infection nor to guide or monitor treatment. Performed at [facility], [address] [place], [place] [address]      Radiology Studies: DG Pelvis Portable  Result Date: 08/12/2023 CLINICAL  DATA:  Status post total hip arthroplasty on the right. EXAM: PORTABLE PELVIS 1-2 VIEWS COMPARISON:  None Available. FINDINGS: Total hip arthroplasty changes are noted on the right. Alignment appears anatomic. No acute fracture or dislocation. Degenerative changes are noted in the lower lumbar spine. Air is noted in the soft tissues along the lateral aspect of the right hip. IMPRESSION: Status post total hip arthroplasty on the right. Electronically Signed   By: Thornell Sartorius M.D.   On: 08/12/2023 23:16   DG HIP UNILAT WITH PELVIS 1V RIGHT  Result Date: 08/12/2023 CLINICAL DATA:  Elective surgery. EXAM: DG HIP (WITH OR WITHOUT PELVIS) 1V RIGHT COMPARISON:  None Available. FINDINGS: Twelve fluoroscopic spot views of the pelvis and right hip obtained in the operating room. Sequential images during hip arthroplasty. Fluoroscopy time 8 seconds. Dose 1.2386 mGy. IMPRESSION: Intraoperative fluoroscopy during right hip  arthroplasty. Electronically Signed   By: Narda Rutherford M.D.   On: 08/12/2023 19:01   DG C-Arm 1-60 Min-No Report  Result Date: 08/12/2023 Fluoroscopy was utilized by the requesting physician.  No radiographic interpretation.   Chest Portable 1 View  Result Date: 08/11/2023 CLINICAL DATA:  Larey Seat, hip injury EXAM: PORTABLE CHEST - 1 VIEW COMPARISON:  None Available. FINDINGS: Lungs are clear. Heart size and mediastinal contours are within normal limits. No effusion. Transverse fracture across the surgical neck of the right humerus. IMPRESSION: 1. No acute cardiopulmonary disease. 2. Right humeral surgical neck fracture. Electronically Signed   By: Corlis Leak M.D.   On: 08/11/2023 14:58   DG Hip Unilat W or Wo Pelvis 2-3 Views Right  Result Date: 08/11/2023 CLINICAL DATA:  Fall; right hip pain w/ limited mobility EXAM: DG HIP (WITH OR WITHOUT PELVIS) 2-3V RIGHT COMPARISON:  None available FINDINGS: Mildly displaced transverse fracture of the right femoral neck. No additional fracture or dislocation. IMPRESSION: Mildly displaced transverse fracture of the right femoral neck. Electronically Signed   By: Acquanetta Belling M.D.   On: 08/11/2023 11:07    Scheduled Meds:  acetaminophen  1,000 mg Oral Q6H   aspirin  81 mg Oral BID   docusate sodium  100 mg Oral BID   feeding supplement  237 mL Oral BID BM   multivitamin with minerals  1 tablet Oral Daily   mupirocin ointment  1 Application Nasal BID   polyethylene glycol  17 g Oral BID   senna-docusate  1 tablet Oral BID   Continuous Infusions:  sodium chloride 75 mL/hr at 08/12/23 2050   sodium chloride     methocarbamol (ROBAXIN) IV       LOS: 2 days   Hughie Closs, MD [facility]  08/13/2023, 9:43 AM   *Please note that this is a verbal dictation therefore any spelling or grammatical errors are due to the "Dragon Medical One" system interpretation.  Please page via Amion and do not message via secure chat for urgent patient care  matters. Secure chat can be used for non urgent patient care matters.  How to contact the [facility] Attending or Consulting provider 7A - 7P or covering provider during after hours 7P -7A, for this patient?  Check the care team in [facility] and look for a) attending/consulting TRH provider listed and b) the [facility] team listed. Page or secure chat 7A-7P. Log into [email or web address] and use [place]'s universal password to access. If you do not have the password, please contact the hospital operator. Locate the [facility] provider you are looking for under [facility] and page  to a number that you can be directly reached. If you still have difficulty reaching the provider, please page the [facility] (Director on Call) for the Hospitalists listed on amion for assistance.

## 2023-08-13 NOTE — TOC Transition Note (Signed)
Transition of Care [facility]) - CM/SW Discharge Note   Patient Details  Name: Melissa Mcdowell MRN: [id number] Date of Birth: [birth year]  Transition of Care [facility]) CM/SW Contact:  Amada Jupiter, LCSW Phone Number: 08/13/2023, 3:57 PM   Clinical Narrative:     Met with pt today to review PT recommendations of DME - small base quad cane and HHPT follow up.  She is agreeable to both and no agency preferences.  Have placed order for quad cane with [employer] for delivery to room tomorrow. HHPT arranged with [facility].  No further TOC needs.  Final next level of care: Home w Home Health Services Barriers to Discharge: No Barriers Identified   Patient Goals and CMS Choice      Discharge Placement                         Discharge Plan and Services Additional resources added to the After Visit Summary for                  DME Arranged: Other see comment (small base quad cane) DME Agency: [employer] Date DME Agency Contacted: 08/13/23 Time DME Agency Contacted: 1556 Representative spoke with at DME Agency: Ian Malkin HH Arranged: PT HH Agency: [facility] Date [facility] Agency Contacted: 08/13/23 Time HH Agency Contacted: 1500 Representative spoke with at [facility] Agency: Tresa Endo  Social Determinants of Health (SDOH) Interventions SDOH Screenings   Food Insecurity: No Food Insecurity (08/11/2023)  Housing: Low Risk  (08/11/2023)  Transportation Needs: No Transportation Needs (08/11/2023)  Utilities: Not At Risk (08/11/2023)  Depression (PHQ2-9): Low Risk  (05/26/2023)  Tobacco Use: Low Risk  (08/12/2023)     Readmission Risk Interventions    08/13/2023    3:55 PM  Readmission Risk Prevention Plan  Post Dischage Appt Complete  Medication Screening Complete  Transportation Screening Complete

## 2023-08-13 NOTE — Evaluation (Addendum)
Physical Therapy Evaluation Patient Details Name: Melissa Mcdowell MRN: [id number] DOB: [birth year] Today's Date: 08/13/2023  History of Present Illness  Pt is a 72 y/o F admitted on 08/11/23 after presenting with c/o a fall. Pt found to have R mildly displaced transverse fracture of the right femoral neck & R humeral surgical neck fracture. Pt underwent R THA anterior approach. Pt's R shoulder will be treated non-operatively, NWB in a sling. PMH: HLD, GERD, anemia, OA, hypercholesterolemia  Clinical Impression  Pt seen for PT evaluation with pt agreeable to tx. Pt reports prior to admission she & her spouse provided a lot of care for her 72 y/o mother-in-law, pt was living in a 2 level home with flight of stairs to access bedroom/bathroom, and pt was independent without AD. On this date, PT provided pt with HEP handout & pt performs RLE strengthening exercises with AROM/AAROM & cuing for technique. PT reviewed weight bearing precautions with pt. Pt was able to progress OOB & ambulate into hallway with QC with min assist, but does fatigue quickly after short distance. Will continue to follow pt acutely to progress gait & stair negotiation. (Provided pt with ice packs to R hip at end of session.)      If plan is discharge home, recommend the following: A little help with walking and/or transfers;Direct supervision/assist for financial management;A little help with bathing/dressing/bathroom;Assistance with cooking/housework;Assist for transportation;Help with stairs or ramp for entrance   Can travel by private vehicle        Equipment Recommendations BSC/3in1 (quad cane)  Recommendations for Other Services    OT consult   Functional Status Assessment Patient has had a recent decline in their functional status and demonstrates the ability to make significant improvements in function in a reasonable and predictable amount of time.     Precautions / Restrictions Precautions Precautions:  Fall;Anterior Hip Required Braces or Orthoses: Sling Restrictions Weight Bearing Restrictions: Yes RUE Weight Bearing: Non weight bearing (in sling) RLE Weight Bearing: Weight bearing as tolerated      Mobility  Bed Mobility Overal bed mobility: Needs Assistance Bed Mobility: Supine to Sit     Supine to sit: Supervision, HOB elevated, Used rails     General bed mobility comments: exits R side of bed    Transfers Overall transfer level: Needs assistance Equipment used: None, Quad cane (IV pole) Transfers: Sit to/from Stand Sit to Stand: Min assist           General transfer comment: education re: hand placement during STS with QC, step pivot bed>recliner on R with pt holding to IV pole with LUE, min assist from PT    Ambulation/Gait Ambulation/Gait assistance: Min assist Gait Distance (Feet): 25 Feet Assistive device: Quad cane Gait Pattern/deviations: Decreased weight shift to right, Decreased stance time - right Gait velocity: decreased     General Gait Details: PT providing ongoing cuing re: gait pattern with QC  Stairs            Wheelchair Mobility     Tilt Bed    Modified Rankin (Stroke Patients Only)       Balance Overall balance assessment: Needs assistance Sitting-balance support: Feet supported Sitting balance-Leahy Scale: Fair     Standing balance support: During functional activity, Single extremity supported, Reliant on assistive device for balance Standing balance-Leahy Scale: Poor  Pertinent Vitals/Pain Pain Assessment Pain Assessment: 0-10 Pain Score: 3  Pain Location: RUE Pain Descriptors / Indicators: Discomfort Pain Intervention(s): Monitored during session    Home Living Family/patient expects to be discharged to:: Private residence Living Arrangements: Spouse/significant other Available Help at Discharge: Family Type of Home: House Home Access: Stairs to enter   [employer] of Steps: 4 at main entrance with R rail, 3-4 at back entrance with B rails Alternate Level Stairs-Number of Steps: flight Home Layout: Two level;Bed/bath upstairs Home Equipment: None      Prior Function Prior Level of Function : Independent/Modified Independent;Driving             Mobility Comments: Independent without AD, helps take care of her 4 y/o mother-in-law ADLs Comments: independent     Extremity/Trunk Assessment   Upper Extremity Assessment Upper Extremity Assessment: Right hand dominant (RUE in sling)    Lower Extremity Assessment Lower Extremity Assessment: RLE deficits/detail RLE Deficits / Details: 3/5 knee extension in sitting    Cervical / Trunk Assessment Cervical / Trunk Assessment: Normal  Communication   Communication Communication: No apparent difficulties  Cognition Arousal: Alert Behavior During Therapy: WFL for tasks assessed/performed Overall Cognitive Status: Within Functional Limits for tasks assessed                                          General Comments      Exercises Total Joint Exercises Ankle Circles/Pumps: AROM, Supine, Right, 10 reps Quad Sets: AROM, Supine, Strengthening, Right, 10 reps Short Arc Quad: AROM, Supine, Strengthening, Right, 10 reps Heel Slides: Supine, AROM, AAROM, Strengthening, Right, 10 reps Hip ABduction/ADduction: AROM, Supine, AAROM, Strengthening, Right, 10 reps Long Arc Quad: AROM, Seated, Strengthening, Right, 10 reps   Assessment/Plan    PT Assessment Patient needs continued PT services  PT Problem List Decreased strength;Pain;Decreased range of motion;Decreased activity tolerance;Decreased mobility;Decreased knowledge of precautions;Decreased safety awareness;Decreased balance;Decreased knowledge of use of DME       PT Treatment Interventions DME instruction;Balance training;Gait training;Neuromuscular re-education;Stair training;Patient/family  education;Functional mobility training;Therapeutic activities;Therapeutic exercise;Manual techniques;Modalities    PT Goals (Current goals can be found in the Care Plan section)  Acute Rehab PT Goals Patient Stated Goal: get better, not burden husband PT Goal Formulation: With patient Time For Goal Achievement: 08/27/23 Potential to Achieve Goals: Good    Frequency Min 1X/week     Co-evaluation               AM-PAC PT "6 Clicks" Mobility  Outcome Measure Help needed turning from your back to your side while in a flat bed without using bedrails?: A Little Help needed moving from lying on your back to sitting on the side of a flat bed without using bedrails?: A Little Help needed moving to and from a bed to a chair (including a wheelchair)?: A Little Help needed standing up from a chair using your arms (e.g., wheelchair or bedside chair)?: A Little Help needed to walk in hospital room?: A Little Help needed climbing 3-5 steps with a railing? : A Lot 6 Click Score: 17    End of Session Equipment Utilized During Treatment: Gait belt Activity Tolerance: Patient limited by fatigue Patient left: in chair;with chair alarm set;with call bell/phone within reach Nurse Communication: Mobility status PT Visit Diagnosis: Pain;Muscle weakness (generalized) (M62.81);Other abnormalities of gait and mobility (R26.89);Difficulty in walking, not elsewhere classified (R26.2)  Pain - Right/Left: Right Pain - part of body: Shoulder    Time: 3557-3220 PT Time Calculation (min) (ACUTE ONLY): 30 min   Charges:   PT Evaluation $PT Eval Low Complexity: 1 Low PT Treatments $Therapeutic Exercise: 8-22 mins PT General Charges $$ ACUTE PT VISIT: 1 Visit         Aleda Grana, PT, DPT 08/13/23, 10:12 AM   Sandi Mariscal 08/13/2023, 9:58 AM

## 2023-08-14 DIAGNOSIS — S72001A Fracture of unspecified part of neck of right femur, initial encounter for closed fracture: Secondary | ICD-10-CM | POA: Diagnosis not present

## 2023-08-14 LAB — CBC WITH DIFFERENTIAL/PLATELET
Abs Immature Granulocytes: 0.06 10*3/uL (ref 0.00–0.07)
Basophils Absolute: 0 10*3/uL (ref 0.0–0.1)
Basophils Relative: 0 %
Eosinophils Absolute: 0 10*3/uL (ref 0.0–0.5)
Eosinophils Relative: 0 %
HCT: 31.1 % — ABNORMAL LOW (ref 36.0–46.0)
Hemoglobin: 10 g/dL — ABNORMAL LOW (ref 12.0–15.0)
Immature Granulocytes: 1 %
Lymphocytes Relative: 13 %
Lymphs Abs: 1.2 10*3/uL (ref 0.7–4.0)
MCH: 32.1 pg (ref 26.0–34.0)
MCHC: 32.2 g/dL (ref 30.0–36.0)
MCV: 99.7 fL (ref 80.0–100.0)
Monocytes Absolute: 1 10*3/uL (ref 0.1–1.0)
Monocytes Relative: 10 %
Neutro Abs: 7.2 10*3/uL (ref 1.7–7.7)
Neutrophils Relative %: 76 %
Platelets: 147 10*3/uL — ABNORMAL LOW (ref 150–400)
RBC: 3.12 MIL/uL — ABNORMAL LOW (ref 3.87–5.11)
RDW: 14.9 % (ref 11.5–15.5)
WBC: 9.4 10*3/uL (ref 4.0–10.5)
nRBC: 0 % (ref 0.0–0.2)

## 2023-08-14 LAB — BASIC METABOLIC PANEL
Anion gap: 8 (ref 5–15)
BUN: 16 mg/dL (ref 8–23)
CO2: 28 mmol/L (ref 22–32)
Calcium: 8.5 mg/dL — ABNORMAL LOW (ref 8.9–10.3)
Chloride: 104 mmol/L (ref 98–111)
Creatinine, Ser: 0.66 mg/dL (ref 0.44–1.00)
GFR, Estimated: >60 mL/min (ref >60)
Glucose, Bld: 107 mg/dL — ABNORMAL HIGH (ref 70–99)
Potassium: 4.1 mmol/L (ref 3.5–5.1)
Sodium: 140 mmol/L (ref 135–145)

## 2023-08-14 MED ORDER — ASPIRIN 81 MG PO CHEW: 81.0000 mg | CHEWABLE_TABLET | Freq: Two times a day (BID) | ORAL | 0 refills | Status: AC

## 2023-08-14 MED ORDER — TIZANIDINE HCL 2 MG PO TABS: 2.0000 mg | ORAL_TABLET | Freq: Three times a day (TID) | ORAL | 2 refills | Status: DC | PRN

## 2023-08-14 MED ORDER — TIZANIDINE HCL 4 MG PO TABS: 2.0000 mg | ORAL_TABLET | Freq: Three times a day (TID) | ORAL | Status: DC | PRN

## 2023-08-14 NOTE — Progress Notes (Signed)
Physical Therapy Treatment Patient Details Name: Melissa Mcdowell MRN: [id number] DOB: [birth year] Today's Date: 08/14/2023   History of Present Illness Pt is a 72 y/o F admitted on 08/11/23 after presenting with c/o a fall. Pt found to have R mildly displaced transverse fracture of the right femoral neck & R humeral surgical neck fracture. Pt underwent R THA anterior approach. Pt's R shoulder will be treated non-operatively, NWB in a sling. PMH: HLD, GERD, anemia, OA, hypercholesterolemia    PT Comments  Pt seen for PT tx with pt agreeable, spouse present for session. Pt is making good progress with mobility, tolerating longer session & ambulating further. Session focused on stair training with spouse present. Pt still requires cuing for compensatory pattern but is able to negotiate stairs without rails with QC or with 1 rail with supervision<>CGA. Pt is anticipating d/c home today.     If plan is discharge home, recommend the following: A little help with walking and/or transfers;Direct supervision/assist for financial management;A little help with bathing/dressing/bathroom;Assistance with cooking/housework;Assist for transportation;Help with stairs or ramp for entrance   Can travel by private vehicle        Equipment Recommendations  Cane (small based quad cane)    Recommendations for Other Services       Precautions / Restrictions Precautions Precautions: Fall;Anterior Hip Required Braces or Orthoses: Sling Restrictions Weight Bearing Restrictions: Yes RUE Weight Bearing: Non weight bearing (in sling) RLE Weight Bearing: Weight bearing as tolerated     Mobility  Bed Mobility Overal bed mobility: Modified Independent Bed Mobility: Supine to Sit     Supine to sit: Modified independent (Device/Increase time), HOB elevated, Used rails     General bed mobility comments: exits R side of bed with use of bed rails    Transfers Overall transfer level: Needs  assistance Equipment used: Quad cane Transfers: Sit to/from Stand Sit to Stand: Supervision           General transfer comment: ongoing cuing re: hand placement during STS from various surfaces with QC    Ambulation/Gait Ambulation/Gait assistance: Contact guard assist, Supervision Gait Distance (Feet): 15 Feet (+ 15 ft + 150 ft + 150 ft) Assistive device: Quad cane Gait Pattern/deviations: Decreased weight shift to right, Decreased stance time - right, Decreased step length - right, Decreased step length - left Gait velocity: decreased         Stairs Stairs: Yes Stairs assistance: Contact guard assist, Supervision Stair Management: No rails, One rail Left, Step to pattern Number of Stairs: 3 (+ 6 (6")) General stair comments: Pt negotiates 3 steps with L ascending rail/L descending rail with sueprvision, negotiates 3 steps without rails with QC with CGA, and negotiates 3 steps laterally with R ascending rail with CGA. Pt requires ongoing cuing re: compensatory pattern, min cuing to not place RUE on rail when negotiating laterally. Pt requires seated rest break throughout practice trials.   Wheelchair Mobility     Tilt Bed    Modified Rankin (Stroke Patients Only)       Balance Overall balance assessment: Needs assistance Sitting-balance support: Feet supported Sitting balance-Leahy Scale: Good     Standing balance support: No upper extremity supported, During functional activity Standing balance-Leahy Scale: Good Standing balance comment: Pt able to perform peri hygiene in standing without LOB                            Cognition Arousal: Alert Behavior During Therapy:  WFL for tasks assessed/performed, Anxious (somewhat anxious re: d/c today) Overall Cognitive Status: Within Functional Limits for tasks assessed                                          Exercises      General Comments General comments (skin integrity, edema,  etc.): Pt with BM incontinence, toilets during session, PT assists pt with changing into clean gown. After ambulating room>gym pt notes feeling dizzy but states symptoms go away with rest & deep breathing. SpO2 as low as 91%, max HR 128 bpm.      Pertinent Vitals/Pain Pain Assessment Pain Assessment: Faces Faces Pain Scale: Hurts little more Pain Location: RUE Pain Descriptors / Indicators: Discomfort Pain Intervention(s): Monitored during session    Home Living Family/patient expects to be discharged to:: Private residence                        Prior Function            PT Goals (current goals can now be found in the care plan section) Acute Rehab PT Goals Patient Stated Goal: get better, not burden husband PT Goal Formulation: With patient Time For Goal Achievement: 08/27/23 Potential to Achieve Goals: Good Progress towards PT goals: Progressing toward goals    Frequency    Min 1X/week      PT Plan      Co-evaluation              AM-PAC PT "6 Clicks" Mobility   Outcome Measure  Help needed turning from your back to your side while in a flat bed without using bedrails?: None Help needed moving from lying on your back to sitting on the side of a flat bed without using bedrails?: A Little Help needed moving to and from a bed to a chair (including a wheelchair)?: A Little Help needed standing up from a chair using your arms (e.g., wheelchair or bedside chair)?: A Little Help needed to walk in hospital room?: A Little Help needed climbing 3-5 steps with a railing? : A Little 6 Click Score: 19    End of Session Equipment Utilized During Treatment: Gait belt (RUE sling) Activity Tolerance: Patient tolerated treatment well;Patient limited by fatigue Patient left: in chair;with call bell/phone within reach;with family/visitor present Nurse Communication: Mobility status PT Visit Diagnosis: Pain;Muscle weakness (generalized) (M62.81);Other abnormalities  of gait and mobility (R26.89);Difficulty in walking, not elsewhere classified (R26.2) Pain - Right/Left: Right Pain - part of body: Arm     Time: 0922-1015 PT Time Calculation (min) (ACUTE ONLY): 53 min  Charges:    $Gait Training: 23-37 mins $Therapeutic Activity: 23-37 mins PT General Charges $$ ACUTE PT VISIT: 1 Visit                     Aleda Grana, PT, DPT 08/14/23, 10:44 AM   Sandi Mariscal 08/14/2023, 10:43 AM

## 2023-08-14 NOTE — Plan of Care (Signed)
Discuss and review plan of care with patient/family  

## 2023-08-14 NOTE — Progress Notes (Signed)
   Subjective: 2 Days Post-Op Procedure(s) (LRB): TOTAL HIP ARTHROPLASTY ANTERIOR APPROACH (Right) Patient reports pain as moderate.   Patient seen in rounds for Dr. Charlann Boxer. Patient is resting in bed on exam this morning. No acute events overnight. Voiding without difficulty. She reports episodes of diarrhea. Patient ambulated 100+ feet with PT yesterday. She did complain of spasms in the right shoulder.  We will continue therapy today.   Objective: Vital signs in last 24 hours: Temp:  [98.1 F (36.7 C)-99.3 F (37.4 C)] 98.5 F (36.9 C) (09/26 0615) Pulse Rate:  [68-77] 76 (09/26 0615) Resp:  [16-17] 17 (09/26 0615) BP: (135-151)/(74-84) 135/78 (09/26 0615) SpO2:  [93 %-97 %] 97 % (09/26 0615)  Intake/Output from previous day:  Intake/Output Summary (Last 24 hours) at 08/14/2023 0830 Last data filed at 08/14/2023 0600 Gross per 24 hour  Intake 1342.9 ml  Output 601 ml  Net 741.9 ml     Intake/Output this shift: No intake/output data recorded.  Labs: Recent Labs    08/11/23 1114 08/13/23 0324 08/14/23 0633  HGB 12.6 10.3* 10.0*   Recent Labs    08/13/23 0324 08/14/23 0633  WBC 9.2 9.4  RBC 3.21* 3.12*  HCT 31.7* 31.1*  PLT 148* 147*   Recent Labs    08/13/23 0324 08/14/23 0633  NA 137 140  K 4.2 4.1  CL 105 104  CO2 27 28  BUN 12 16  CREATININE 0.72 0.66  GLUCOSE 147* 107*  CALCIUM 8.0* 8.5*   No results for input(s): "LABPT", "INR" in the last 72 hours.  Exam: General - Patient is Alert and Oriented Extremity - Neurologically intact Sensation intact distally Intact pulses distally Dorsiflexion/Plantar flexion intact  RUE in sling  Dressing - dressing C/D/I Motor Function - intact, moving foot and toes well on exam.   Past Medical History:  Diagnosis Date   Acid reflux    occasional   Anemia    no current med.   Arthritis    lower back, fingers   Complication of anesthesia    states has a small mouth   Dental crowns present     Headache(784.0)    sinus - 3-4 x/month   Hypercholesteremia    Mucous cyst of finger 06/18/2012   left long finger    Assessment/Plan: 2 Days Post-Op Procedure(s) (LRB): TOTAL HIP ARTHROPLASTY ANTERIOR APPROACH (Right) Principal Problem:   Closed right hip fracture, initial encounter [facility]) Active Problems:   Right humeral fracture   Hyperglycemia   Hypercalcemia   S/P total right hip arthroplasty  Estimated body mass index is 30.11 kg/m as calculated from the following:   Height as of this encounter: 5\' 3"  (1.6 m).   Weight as of this encounter: 77.1 kg.   DVT Prophylaxis - Aspirin Weight bearing as tolerated RLE NWB in sling RUE   Hgb stable at 10.3 this AM.   Plan to get up with PT today to begin practicing with likely left sided platform walker.   Dr. Charlann Boxer discussed right humerus fracture with Dr. Aundria Rud who recommends non-op treatment. She will follow up with him for this.    Plan for discharge home once she is meeting goals with PT. Hopeful discharge home today vs tomorrow.   She should not require SNF placement, as she is very active at baseline with good family support.   Rosalene Billings, PA-C Orthopedic Surgery [phone] 08/14/2023, 8:30 AM

## 2023-08-14 NOTE — Progress Notes (Signed)
Reviewed d/c with patient. All questions answered. Pt awaiting husband to arrive for d/c.

## 2023-08-14 NOTE — Discharge Summary (Signed)
Physician Discharge Summary  Melissa Mcdowell GMW:[id number] DOB: 07-Dec-1950 DOA: 08/11/2023  PCP: Charlton Amor, DO  Admit date: 08/11/2023 Discharge date: 08/14/2023 30 Day Unplanned Readmission Risk Score    Flowsheet Row ED to Hosp-Admission (Current) from 08/11/2023 in [facility]  30 Day Unplanned Readmission Risk Score (%) 8.75 Filed at 08/14/2023 0801       This score is the patient's risk of an unplanned readmission within 30 days of being discharged (0 -100%). The score is based on dignosis, age, lab data, medications, orders, and past utilization.   Low:  0-14.9   Medium: 15-21.9   High: 22-29.9   Extreme: 30 and above          Admitted From: Home Disposition: Home  Recommendations for Outpatient Follow-up:  Follow up with PCP in 1-2 weeks Please obtain BMP/CBC in one week Follow-up with orthopedics in 2 weeks for wound check of the right hip. Follow-up with Dr. Charlann Boxer for further management of the right humerus fracture. Please follow up with your PCP on the following pending results: Unresulted Labs (From admission, onward)     Start     Ordered   08/13/23 0500  CBC with Differential/Platelet  Daily,   R      08/12/23 1353   08/13/23 0500  Basic metabolic panel  Daily,   R      08/12/23 1353   08/13/23 0500  CBC  Daily,   R      08/12/23 2055              Home Health: Yes Equipment/Devices:   Discharge Condition: Stable CODE STATUS: Full code Diet recommendation: Cardiac  Subjective: Seen and examined.  Husband at the bedside.  Pain minimal controlled but she was slightly apprehensive about going home in the morning because of the night, she had some pain which was very well-controlled this morning when I saw her.  She worked with PT OT again.  Yet again, they recommended home health.  Patient is now agreeable.  She is cleared by orthopedics as well.  Brief/Interim Summary: Melissa Mcdowell is a 72 y.o. female with PMH significant  for HLD, GERD, anemia, osteoarthritis 9/23, patient was brought to the ED from home after a fall. She did not hit her head or pass out.  She is not on any blood thinner.  She was hemodynamically stable upon arrival to the ED. Right hip x-ray showed mildly displaced transverse fracture of the right femoral neck. Right shoulder x-ray showed right humeral surgical neck fracture.     Closed right hip fracture Closed right humerus fracture Fractures secondary to mechanical fall  Orthopedics consulted.  Underwent total hip arthroplasty anterior approach on 08/12/2023, to be seen by PT OT.  Pain will well-controlled.  PT recommended home health PT which has been arranged for her.  Orthopedics cleared her for discharge.  She has been prescribed pain medications.  They recommended aspirin 81 mg p.o. twice daily for DVT prophylaxis.  She will follow-up with them as outpatient.   Prediabetes Blood sugar level was mildly elevated.  No history of diabetes.  A1c controlled at 5.8. Lifestyle modifications recommended   Hyperlipidemia Continue Zetia 10 mg daily   Acute/postoperative blood loss anemia: Hemoglobin dropped from 12.6 yesterday to 10 postoperatively but has remained stable since yesterday.  No indication of transfusion.  Mild thrombocytopenia: Stable.  Secondary to stress reaction.  Repeat CBC in 1 week at PCPs office.   Discharge plan  was discussed with patient and/or family member and they verbalized understanding and agreed with it.  Discharge Diagnoses:  Principal Problem:   Closed right hip fracture, initial encounter [facility]) Active Problems:   Right humeral fracture   Hyperglycemia   Hypercalcemia   S/P total right hip arthroplasty    Discharge Instructions   Allergies as of 08/14/2023       Reactions   Sulfa Antibiotics Nausea And Vomiting   Sister has anaphylaxis. Throat swelling, stops breathing, heart stops   Amoxicillin Nausea And Vomiting   Adhesive [tape] Other (See  Comments)   SKIN FLAKES OFF   Morphine And Codeine Other (See Comments)   HYPERACTIVITY AND HAS NO RELIEF OF PAIN        Medication List     TAKE these medications    acetaminophen 325 MG tablet Commonly known as: Tylenol Take 2 tablets (650 mg total) by mouth every 6 (six) hours as needed for up to 30 doses for mild pain or moderate pain.   ascorbic acid 500 MG tablet Commonly known as: VITAMIN C Take 1,000 mg by mouth daily.   aspirin 81 MG chewable tablet Chew 1 tablet (81 mg total) by mouth 2 (two) times daily.   estradiol 0.5 MG tablet Commonly known as: ESTRACE Take 0.5 mg by mouth daily.   ezetimibe 10 MG tablet Commonly known as: ZETIA Take 10 mg by mouth daily.   ibuprofen 600 MG tablet Commonly known as: ADVIL Take 1 tablet (600 mg total) by mouth every 6 (six) hours as needed for mild pain or moderate pain.   KLS ALLER-FLO NA Place into the nose.   loratadine 10 MG tablet Commonly known as: CLARITIN Take 10 mg by mouth daily.   oxyCODONE 5 MG immediate release tablet Commonly known as: Roxicodone Take 1 tablet (5 mg total) by mouth every 6 (six) hours as needed for up to 20 doses for severe pain.               Durable Medical Equipment  (From admission, onward)           Start     Ordered   08/13/23 1602  For home use only DME Other see comment  Once       Comments: small base quad cane  Question:  Length of Need  Answer:  Lifetime   08/13/23 1601            Follow-up Information     [facility] Emergency Department at [facility]. Go to .   Specialty: Emergency Medicine Why: If symptoms worsen Contact information: [address] [place] [address] [phone]        Durene Romans, MD. Schedule an appointment as soon as possible for a visit .   Specialty: Orthopedic Surgery Why: Please call to schedule an appoinment for your shoulder fracture with an orthopedic specialist Contact  information: [address] [place] [address] [phone]         Charlton Amor, DO Follow up in 1 week(s).   Specialty: Family Medicine Contact information: [address] [place] [address] [phone]                Allergies  Allergen Reactions   Sulfa Antibiotics Nausea And Vomiting    Sister has anaphylaxis. Throat swelling, stops breathing, heart stops   Amoxicillin Nausea And Vomiting   Adhesive [Tape] Other (See Comments)    SKIN  FLAKES OFF   Morphine And Codeine Other (See Comments)    HYPERACTIVITY AND HAS NO RELIEF OF PAIN    Consultations: Ortho    Procedures/Studies: DG Pelvis Portable  Result Date: 08/12/2023 CLINICAL DATA:  Status post total hip arthroplasty on the right. EXAM: PORTABLE PELVIS 1-2 VIEWS COMPARISON:  None Available. FINDINGS: Total hip arthroplasty changes are noted on the right. Alignment appears anatomic. No acute fracture or dislocation. Degenerative changes are noted in the lower lumbar spine. Air is noted in the soft tissues along the lateral aspect of the right hip. IMPRESSION: Status post total hip arthroplasty on the right. Electronically Signed   By: Thornell Sartorius M.D.   On: 08/12/2023 23:16   DG HIP UNILAT WITH PELVIS 1V RIGHT  Result Date: 08/12/2023 CLINICAL DATA:  Elective surgery. EXAM: DG HIP (WITH OR WITHOUT PELVIS) 1V RIGHT COMPARISON:  None Available. FINDINGS: Twelve fluoroscopic spot views of the pelvis and right hip obtained in the operating room. Sequential images during hip arthroplasty. Fluoroscopy time 8 seconds. Dose 1.2386 mGy. IMPRESSION: Intraoperative fluoroscopy during right hip arthroplasty. Electronically Signed   By: Narda Rutherford M.D.   On: 08/12/2023 19:01   DG C-Arm 1-60 Min-No Report  Result Date: 08/12/2023 Fluoroscopy was utilized by the requesting physician.  No radiographic interpretation.   Chest Portable 1 View  Result Date:  08/11/2023 CLINICAL DATA:  Larey Seat, hip injury EXAM: PORTABLE CHEST - 1 VIEW COMPARISON:  None Available. FINDINGS: Lungs are clear. Heart size and mediastinal contours are within normal limits. No effusion. Transverse fracture across the surgical neck of the right humerus. IMPRESSION: 1. No acute cardiopulmonary disease. 2. Right humeral surgical neck fracture. Electronically Signed   By: Corlis Leak M.D.   On: 08/11/2023 14:58   DG Hip Unilat W or Wo Pelvis 2-3 Views Right  Result Date: 08/11/2023 CLINICAL DATA:  Fall; right hip pain w/ limited mobility EXAM: DG HIP (WITH OR WITHOUT PELVIS) 2-3V RIGHT COMPARISON:  None available FINDINGS: Mildly displaced transverse fracture of the right femoral neck. No additional fracture or dislocation. IMPRESSION: Mildly displaced transverse fracture of the right femoral neck. Electronically Signed   By: Acquanetta Belling M.D.   On: 08/11/2023 11:07   DG Shoulder Right Portable  Result Date: 08/11/2023 CLINICAL DATA:  Slipped and fell hitting floor. Right shoulder pain and deformity. EXAM: RIGHT SHOULDER - 1 VIEW COMPARISON:  None available FINDINGS: Comminuted, mildly impacted fracture of the proximal humerus extending through the greater tuberosity. Lucency at the inferior tip of the glenoid also suspicious for fracture. Prominent subacromial spur is noted. Mild acromioclavicular osteoarthrosis. IMPRESSION: 1. Comminuted fracture of the right proximal humerus involving greater tuberosity. 2. Findings suspicious for nondisplaced fracture of the inferior glenoid. Electronically Signed   By: Acquanetta Belling M.D.   On: 08/11/2023 09:18     Discharge Exam: Vitals:   08/13/23 2127 08/14/23 0615  BP: (!) 151/84 135/78  Pulse: 77 76  Resp: 17 17  Temp: 99.3 F (37.4 C) 98.5 F (36.9 C)  SpO2: 95% 97%   Vitals:   08/13/23 1016 08/13/23 1330 08/13/23 2127 08/14/23 0615  BP: (!) 148/76 136/74 (!) 151/84 135/78  Pulse: 68 72 77 76  Resp: 16 16 17 17   Temp: 98.1 F (36.7  C) 98.4 F (36.9 C) 99.3 F (37.4 C) 98.5 F (36.9 C)  TempSrc: Oral Oral Oral Oral  SpO2: 93% 95% 95% 97%  Weight:      Height:  General: Pt is alert, awake, not in acute distress Cardiovascular: RRR, S1/S2 +, no rubs, no gallops Respiratory: CTA bilaterally, no wheezing, no rhonchi Abdominal: Soft, NT, ND, bowel sounds + Extremities: no edema, no cyanosis, sling in the right upper arm    The results of significant diagnostics from this hospitalization (including imaging, microbiology, ancillary and laboratory) are listed below for reference.     Microbiology: Recent Results (from the past 240 hour(s))  Surgical PCR screen     Status: None   Collection Time: 08/11/23  9:10 PM   Specimen: Nasal Mucosa; Nasal Swab  Result Value Ref Range Status   MRSA, PCR NEGATIVE NEGATIVE Final   Staphylococcus aureus NEGATIVE NEGATIVE Final    Comment: (NOTE) The Xpert SA Assay (FDA approved for NASAL specimens in patients 48 years of age and older), is one component of a comprehensive surveillance program. It is not intended to diagnose infection nor to guide or monitor treatment. Performed at [facility], [address], [place], [place] [address]      Labs: BNP (last 3 results) No results for input(s): "BNP" in the last 8760 hours. Basic Metabolic Panel: Recent Labs  Lab 08/11/23 1114 08/12/23 0333 08/13/23 0324 08/14/23 0633  NA 140 134* 137 140  K 3.9 3.9 4.2 4.1  CL 106 100 105 104  CO2 23 25 27 28   GLUCOSE 106* 137* 147* 107*  BUN 15 14 12 16   CREATININE 0.54 0.80 0.72 0.66  CALCIUM 8.7* 8.6* 8.0* 8.5*   Liver Function Tests: Recent Labs  Lab 08/12/23 0333  AST 22  ALT 19  ALKPHOS 55  BILITOT 0.6  PROT 6.6  ALBUMIN 3.6   No results for input(s): "LIPASE", "AMYLASE" in the last 168 hours. No results for input(s): "AMMONIA" in the last 168 hours. CBC: Recent Labs  Lab 08/11/23 1114 08/13/23 0324 08/14/23 0633  WBC 10.1 9.2  9.4  NEUTROABS  --  8.1* 7.2  HGB 12.6 10.3* 10.0*  HCT 38.3 31.7* 31.1*  MCV 97.5 98.8 99.7  PLT 168 148* 147*   Cardiac Enzymes: No results for input(s): "CKTOTAL", "CKMB", "CKMBINDEX", "TROPONINI" in the last 168 hours. BNP: Invalid input(s): "POCBNP" CBG: No results for input(s): "GLUCAP" in the last 168 hours. D-Dimer No results for input(s): "DDIMER" in the last 72 hours. Hgb A1c Recent Labs    08/12/23 0500  HGBA1C 5.8*   Lipid Profile No results for input(s): "CHOL", "HDL", "LDLCALC", "TRIG", "CHOLHDL", "LDLDIRECT" in the last 72 hours. Thyroid function studies No results for input(s): "TSH", "T4TOTAL", "T3FREE", "THYROIDAB" in the last 72 hours.  Invalid input(s): "FREET3" Anemia work up No results for input(s): "VITAMINB12", "FOLATE", "FERRITIN", "TIBC", "IRON", "RETICCTPCT" in the last 72 hours. Urinalysis No results found for: "COLORURINE", "APPEARANCEUR", "LABSPEC", "PHURINE", "GLUCOSEU", "HGBUR", "BILIRUBINUR", "KETONESUR", "PROTEINUR", "UROBILINOGEN", "NITRITE", "LEUKOCYTESUR" Sepsis Labs Recent Labs  Lab 08/11/23 1114 08/13/23 0324 08/14/23 0633  WBC 10.1 9.2 9.4   Microbiology Recent Results (from the past 240 hour(s))  Surgical PCR screen     Status: None   Collection Time: 08/11/23  9:10 PM   Specimen: Nasal Mucosa; Nasal Swab  Result Value Ref Range Status   MRSA, PCR NEGATIVE NEGATIVE Final   Staphylococcus aureus NEGATIVE NEGATIVE Final    Comment: (NOTE) The Xpert SA Assay (FDA approved for NASAL specimens in patients 29 years of age and older), is one component of a comprehensive surveillance program. It is not intended to diagnose infection nor to guide or monitor treatment.  Performed at [facility], [address] [place], [place] [address]     FURTHER DISCHARGE INSTRUCTIONS:   Get Medicines reviewed and adjusted: Please take all your medications with you for your next visit with your Primary MD    Laboratory/radiological data: Please request your Primary MD to go over all hospital tests and procedure/radiological results at the follow up, please ask your Primary MD to get all Hospital records sent to his/her office.   In some cases, they will be blood work, cultures and biopsy results pending at the time of your discharge. Please request that your primary care M.D. goes through all the records of your hospital data and follows up on these results.   Also Note the following: If you experience worsening of your admission symptoms, develop shortness of breath, life threatening emergency, suicidal or homicidal thoughts you must seek medical attention immediately by calling 911 or calling your MD immediately  if symptoms less severe.   You must read complete instructions/literature along with all the possible adverse reactions/side effects for all the Medicines you take and that have been prescribed to you. Take any new Medicines after you have completely understood and accpet all the possible adverse reactions/side effects.    Do not drive when taking Pain medications or sleeping medications (Benzodaizepines)   Do not take more than prescribed Pain, Sleep and Anxiety Medications. It is not advisable to combine anxiety,sleep and pain medications without talking with your primary care practitioner   Special Instructions: If you have smoked or chewed Tobacco  in the last 2 yrs please stop smoking, stop any regular Alcohol  and or any Recreational drug use.   Wear Seat belts while driving.   Please note: You were cared for by a hospitalist during your hospital stay. Once you are discharged, your primary care physician will handle any further medical issues. Please note that NO REFILLS for any discharge medications will be authorized once you are discharged, as it is imperative that you return to your primary care physician (or establish a relationship with a primary care physician if you do not  have one) for your post hospital discharge needs so that they can reassess your need for medications and monitor your lab values  Time coordinating discharge: Over 30 minutes  SIGNED:   Hughie Closs, MD  [facility] 08/14/2023, 11:15 AM *Please note that this is a verbal dictation therefore any spelling or grammatical errors are due to the "Dragon [employer] One" system interpretation. If 7PM-7AM, please contact night-coverage [email or web address]

## 2023-08-14 NOTE — TOC Progression Note (Signed)
Transition of Care [facility]) - Progression Note   Patient Details  Name: Melissa Mcdowell MRN: [id number] Date of Birth: March 16, 1951  [facility] CM/SW Contact  Ewing Schlein, LCSW Phone Number: 08/14/2023, 12:19 PM  Clinical Narrative: PT notified CSW patient was interested in a BSC/3N1. CSW notified husband that patient is too ambulatory to qualify for insurance covering the 3N1 as patient will not be restricted to one room or floor of the house without a bathroom. Patient and husband to private pay for one after discharge. TOC signing off.  Barriers to Discharge: No Barriers Identified  Expected Discharge Plan and Services Expected Discharge Date: 08/14/23               DME Arranged: Other see comment (small base quad cane) DME Agency: [employer] Date DME Agency Contacted: 08/13/23 Time DME Agency Contacted: 1556 Representative spoke with at DME Agency: Ian Malkin HH Arranged: PT HH Agency: [facility] Date [facility] Agency Contacted: 08/13/23 Time HH Agency Contacted: 1500 Representative spoke with at [facility] Agency: Tresa Endo  Social Determinants of Health (SDOH) Interventions SDOH Screenings   Food Insecurity: No Food Insecurity (08/11/2023)  Housing: Low Risk  (08/11/2023)  Transportation Needs: No Transportation Needs (08/11/2023)  Utilities: Not At Risk (08/11/2023)  Depression (PHQ2-9): Low Risk  (05/26/2023)  Tobacco Use: Low Risk  (08/12/2023)   Readmission Risk Interventions    08/13/2023    3:55 PM  Readmission Risk Prevention Plan  Post Dischage Appt Complete  Medication Screening Complete  Transportation Screening Complete

## 2023-08-14 NOTE — Anesthesia Postprocedure Evaluation (Signed)
Anesthesia Post Note  Patient: Melissa Mcdowell  Procedure(s) Performed: TOTAL HIP ARTHROPLASTY ANTERIOR APPROACH (Right: Hip)     Patient location during evaluation: PACU Anesthesia Type: General Level of consciousness: sedated and patient cooperative Pain management: pain level controlled Vital Signs Assessment: post-procedure vital signs reviewed and stable Respiratory status: spontaneous breathing Cardiovascular status: stable Anesthetic complications: no   No notable events documented.  Last Vitals:  Vitals:   08/13/23 2127 08/14/23 0615  BP: (!) 151/84 135/78  Pulse: 77 76  Resp: 17 17  Temp: 37.4 C 36.9 C  SpO2: 95% 97%    Last Pain:  Vitals:   08/14/23 0615  TempSrc: Oral  PainSc:                  Lewie Loron

## 2023-08-14 NOTE — Evaluation (Signed)
Occupational Therapy Evaluation Patient Details Name: Melissa Mcdowell MRN: [id number] DOB: 29-Jul-1951 Today's Date: 08/14/2023   History of Present Illness Pt is a 72 y/o F admitted on 08/11/23 after presenting with c/o a fall. Pt found to have R mildly displaced transverse fracture of the right femoral neck & R humeral surgical neck fracture. Pt underwent R THA anterior approach. Pt's R shoulder will be treated non-operatively, NWB in a sling. PMH: HLD, GERD, anemia, OA, hypercholesterolemia   Clinical Impression   Pt presents after a fall without functional use of RT dominant upper extremity secondary to current shoulder precautions. Therapist provided education and instruction to patient and spouse in regards to exercises (hand and wrist only until cleared for more), precautions, positioning, donning upper extremity clothing and bathing while maintaining shoulder precautions, ice and edema management and donning/doffing sling. Patient and spouse verbalized understanding and demonstrated as needed.   Patient will need assistance to donn shirt, underwear, pants, socks and shoes and provided with instruction on compensatory strategies to perform ADLs. Patient limited by decreased ROM in RT shoulder so therefore will need some form of assistance at home. Patient and spouse verbalized and/or demonstrated understanding to all instruction.  Due to pt also having further complication of RT hemiarthroplasty while dealing with strict RUE precautions, home health PT and OT are recommended.  Patient to follow up with MD for any additional further therapy needs.         If plan is discharge home, recommend the following: A little help with bathing/dressing/bathroom;A little help with walking and/or transfers;Assistance with cooking/housework;Assist for transportation    Functional Status Assessment  Patient has had a recent decline in their functional status and demonstrates the ability to make  significant improvements in function in a reasonable and predictable amount of time.  Equipment Recommendations  BSC/3in1 (Bidet with LT controls)    Recommendations for Other Services       Precautions / Restrictions Precautions Precautions: Fall;Shoulder Shoulder Interventions: Shoulder sling/immobilizer;At all times Precaution Booklet Issued: Yes (comment) Precaution Comments: Updated to reflect lack of surgical precautions. Required Braces or Orthoses: Sling Restrictions Weight Bearing Restrictions: Yes RUE Weight Bearing: Non weight bearing RLE Weight Bearing: Weight bearing as tolerated Other Position/Activity Restrictions: Attempted to contact Ortho surgery team to clarify RUE movement restrictions for possible hand/wrist/forearm/elbow HEP. On hold for 20 min then connected to a call between a provider and patient and no one could hear this OT on the phone. Could not reattempt call due to time.      Mobility Bed Mobility  Pt in recliner                  Transfers    Defer to PT. Pt too fatigued to get up with OT                      Balance   Sitting-balance support: Feet supported, No upper extremity supported Sitting balance-Leahy Scale: Good                                     ADL either performed or assessed with clinical judgement   ADL Overall ADL's : Needs assistance/impaired Eating/Feeding: Set up;Sitting   Grooming: Set up;Sitting   Upper Body Bathing: Moderate assistance;Cueing for compensatory techniques;With caregiver independent assisting;Cueing for UE precautions   Lower Body Bathing: Minimal assistance;Sitting/lateral leans;Sit to/from stand;Cueing for compensatory techniques;With caregiver  independent assisting;With adaptive equipment   Upper Body Dressing : Moderate assistance;Cueing for UE precautions;Cueing for compensatory techniques;Cueing for sequencing;Adhering to UE precautions;Sitting   Lower Body  Dressing: Minimal assistance;Moderate assistance;Sitting/lateral leans;Sit to/from stand;Cueing for compensatory techniques;Cueing for sequencing;With caregiver independent assisting     Toilet Transfer Details (indicate cue type and reason): Pt declined due to fatigue after PT and feeling overwhelmed with all the instructions.  Will defer to PT for mobility report today. Toileting- Clothing Manipulation and Hygiene: Cueing for sequencing;Cueing for compensatory techniques;Minimal assistance;With adaptive equipment;Sitting/lateral lean;Sit to/from stand;With caregiver independent assisting Toileting - Clothing Manipulation Details (indicate cue type and reason): Ed on bidet options with LT sided handles.             Vision Baseline Vision/History: 0 No visual deficits Vision Assessment?: No apparent visual deficits     Perception         Praxis         Pertinent Vitals/Pain Pain Assessment Pain Assessment: 0-10 Pain Score: 5  Pain Location: RUE. RT hip feels "tired" Pain Intervention(s): Monitored during session, Limited activity within patient's tolerance     Extremity/Trunk Assessment Upper Extremity Assessment Upper Extremity Assessment: Right hand dominant;RUE deficits/detail RUE Deficits / Details: Moving RT hand and wrist well. RUE: Unable to fully assess due to immobilization   Lower Extremity Assessment Lower Extremity Assessment: Defer to PT evaluation       Communication Communication Communication: No apparent difficulties   Cognition Arousal: Alert Behavior During Therapy: WFL for tasks assessed/performed, Anxious Overall Cognitive Status: Within Functional Limits for tasks assessed                                       General Comments  Pt with BM incontinence, toilets during session, PT assists pt with changing into clean gown. After ambulating room>gym pt notes feeling dizzy but states symptoms go away with rest & deep breathing. SpO2  as low as 91%, max HR 128 bpm.    Exercises     Shoulder Instructions Shoulder Instructions Donning/doffing shirt without moving shoulder: Minimal assistance;Moderate assistance;Caregiver independent with task;Patient able to independently direct caregiver Method for sponge bathing under operated UE: Caregiver independent with task;Moderate assistance;Maximal assistance;Patient able to independently direct caregiver Donning/doffing sling/immobilizer: Moderate assistance;Maximal assistance;Caregiver independent with task;Patient able to independently direct caregiver Correct positioning of sling/immobilizer: Minimal assistance;Caregiver independent with task;Patient able to independently direct caregiver ROM for elbow, wrist and digits of operated UE: Independent;Patient able to independently direct caregiver;Caregiver independent with task (hand/wrist only until pt cleared for more proximal ROM) Sling wearing schedule (on at all times/off for ADL's): Independent;Patient able to independently direct caregiver;Caregiver independent with task Proper positioning of operated UE when showering: Minimal assistance;Caregiver independent with task;Patient able to independently direct caregiver Positioning of UE while sleeping: Minimal assistance;Caregiver independent with task;Patient able to independently direct caregiver    Home Living Family/patient expects to be discharged to:: Private residence Living Arrangements: Spouse/significant other Available Help at Discharge: Family Type of Home: House Home Access: Stairs to enter [employer] of Steps: 4 at main entrance with R rail, 3-4 at back entrance with B rails   Home Layout: Two level;Bed/bath upstairs;1/2 bath on main level Alternate Level Stairs-Number of Steps: flight Alternate Level Stairs-Rails: Right     Bathroom Toilet: Handicapped height (All toilets)     Home Equipment: None  Prior Functioning/Environment  Prior Level of Function : Independent/Modified Independent;Driving             Mobility Comments: Independent without AD, helps take care of her 18 y/o mother-in-law ADLs Comments: independent        OT Problem List: Impaired UE functional use;Pain;Decreased knowledge of use of DME or AE;Decreased knowledge of precautions;Decreased activity tolerance;Decreased range of motion;Decreased strength      OT Treatment/Interventions: Therapeutic activities;Self-care/ADL training;Therapeutic exercise;Patient/family education;Energy conservation;DME and/or AE instruction;Balance training    OT Goals(Current goals can be found in the care plan section) Acute Rehab OT Goals Patient Stated Goal: To be safe at home OT Goal Formulation: With patient/family Time For Goal Achievement: 08/28/23 Potential to Achieve Goals: Good ADL Goals Pt Will Perform Upper Body Bathing: with min assist;sitting;with adaptive equipment;with caregiver independent in assisting Pt Will Perform Upper Body Dressing: with min assist;with caregiver independent in assisting;with adaptive equipment Pt Will Perform Toileting - Clothing Manipulation and hygiene: with modified independence;with adaptive equipment;with caregiver independent in assisting;sitting/lateral leans Additional ADL Goal #1: Pt will demonstrate UE/LE dressing, donning/doffing of sling, correct positioning of RT UE, and compensatory strategies for RT axilla hygiene, as well as use of Ice, while correctly following all shoulder  precautions/restrictions with spouse demonstrating assistance as needed. Additional ADL Goal #2: Pt will demonstrate NWB to RUE with all ADLs and mobility  OT Frequency: Min 1X/week    Co-evaluation              AM-PAC OT "6 Clicks" Daily Activity     Outcome Measure Help from another person eating meals?: A Little Help from another person taking care of personal grooming?: A Little Help from another person toileting, which  includes using toliet, bedpan, or urinal?: A Little Help from another person bathing (including washing, rinsing, drying)?: A Lot Help from another person to put on and taking off regular upper body clothing?: A Lot Help from another person to put on and taking off regular lower body clothing?: A Little 6 Click Score: 16   End of Session Equipment Utilized During Treatment:  (sling) Nurse Communication: Other (comment) (OT session completed)  Activity Tolerance: Patient limited by fatigue Patient left: in chair;with call bell/phone within reach;with family/visitor present  OT Visit Diagnosis: Pain;History of falling (Z91.81);Muscle weakness (generalized) (M62.81) Pain - Right/Left: Right Pain - part of body: Shoulder                Time: 1610-9604 OT Time Calculation (min): 34 min Charges:  OT General Charges $OT Visit: 1 Visit OT Evaluation $OT Eval Low Complexity: 1 Low OT Treatments $Self Care/Home Management : 8-22 mins  Victorino Dike, OT Acute Rehab Services Office: [phone] 08/14/2023  Theodoro Clock 08/14/2023, 12:43 PM

## 2023-08-15 ENCOUNTER — Telehealth: Payer: Self-pay | Admitting: *Deleted

## 2023-08-15 NOTE — Transitions of Care (Post Inpatient/ED Visit) (Signed)
08/15/2023  Name: Melissa Mcdowell MRN: [id number] DOB: 03-11-1951  Today's TOC FU Call Status: Today's TOC FU Call Status:: Successful TOC FU Call Completed TOC FU Call Complete Date: 08/15/23 Patient's Name and Date of Birth confirmed.  Transition Care Management Follow-up Telephone Call Date of Discharge: 08/14/23 Discharge Facility: [facility]) Type of Discharge: Inpatient Admission Primary Inpatient Discharge Diagnosis:: mechanical fall with hip and humerus fractures- surgical total (R) hip replacement How have you been since you were released from the hospital?: Better (per husband: "She is doing better than i thought she would be- PT is here now at the house and she is just having to use the cane, getting around fine.  Thanks for getting this doctor appointment scheduled for [place]") Any questions or concerns?: No  Items Reviewed: Did you receive and understand the discharge instructions provided?: Yes (thoroughly reviewed with patient's spouse who verbalizes good understanding of same) Medications obtained,verified, and reconciled?: Yes (Medications Reviewed) (Full medication reconciliation/ review completed; no concerns or discrepancies identified; confirmed patient obtained/ is taking all newly Rx'd medications as instructed; self-manages medications and denies questions/ concerns around medications today) Any new allergies since your discharge?: No Dietary orders reviewed?: Yes Type of Diet Ordered:: "Regular" Do you have support at home?: Yes People in Home: spouse Name of Support/Comfort Primary Source: Reports independent in self-care activities at baseline; supportive spouse assists as/ if needed/ indicated  Medications Reviewed Today: Medications Reviewed Today     Reviewed by Michaela Corner, RN (Registered Nurse) on 08/15/23 at 1203  Med List Status: <None>   Medication Order Taking? Sig Documenting Provider Last Dose Status Informant  acetaminophen (TYLENOL)  325 MG tablet [id number] Yes Take 2 tablets (650 mg total) by mouth every 6 (six) hours as needed for up to 30 doses for mild pain or moderate pain. Terald Sleeper, MD Taking Active   ascorbic acid (VITAMIN C) 500 MG tablet [id number] Yes Take 1,000 mg by mouth daily. [provider] Taking Active Self  aspirin 81 MG chewable tablet [id number] Yes Chew 1 tablet (81 mg total) by mouth 2 (two) times daily. Hughie Closs, MD Taking Active   estradiol (ESTRACE) 0.5 MG tablet [id number] Yes Take 0.5 mg by mouth daily. [provider] Taking Active Self  ezetimibe (ZETIA) 10 MG tablet [id number] Yes Take 10 mg by mouth daily. [provider] Taking Active Self  Fluticasone Propionate (KLS ALLER-FLO NA) [id number] Yes Place into the nose. [provider] Taking Active Self           Med Note Michaela Corner   Fri Aug 15, 2023 11:40 AM) 08/15/23: reports during [facility] call, takes prn    ibuprofen (ADVIL) 600 MG tablet [id number] Yes Take 1 tablet (600 mg total) by mouth every 6 (six) hours as needed for mild pain or moderate pain. Terald Sleeper, MD Taking Active   loratadine (CLARITIN) 10 MG tablet [id number] Yes Take 10 mg by mouth daily. [provider] Taking Active Self           Med Note Michaela Corner   Fri Aug 15, 2023 11:40 AM) 08/15/23: reports during [facility] call, takes prn  oxyCODONE (ROXICODONE) 5 MG immediate release tablet [id number] Yes Take 1 tablet (5 mg total) by mouth every 6 (six) hours as needed for up to 20 doses for severe pain. Terald Sleeper, MD Taking Active   tiZANidine (ZANAFLEX) 2 MG tablet [id number] Yes Take 1-2 tablets (2-4 mg total) by  mouth every 8 (eight) hours as needed for muscle spasms. Cassandria Anger, PA-C Taking Active            Home Care and Equipment/Supplies: Were Home Health Services Ordered?: Yes Name of Home Health Agency:: [facility]- PT Has Agency set up a time to come to your home?: Yes First Home Health Visit  Date: 08/15/23 (husband verifies home health PT currently at their home and working with patient) Any new equipment or medical supplies ordered?: Yes (quad cane) Name of Medical supply agency?: [employer] Were you able to get the equipment/medical supplies?: Yes Do you have any questions related to the use of the equipment/supplies?: No  Functional Questionnaire: Do you need assistance with bathing/showering or dressing?: No Do you need assistance with meal preparation?: No Do you need assistance with eating?: No Do you have difficulty maintaining continence: No Do you need assistance with getting out of bed/getting out of a chair/moving?: No Do you have difficulty managing or taking your medications?: No  Follow up appointments reviewed: PCP Follow-up appointment confirmed?: Yes (care coordination outreach in real-time with scheduling care guide to successfully schedule hospital follow up PCP appointment 08/21/23) Date of PCP follow-up appointment?: 08/21/23 (care coordination outreach in real-time with scheduling care guide to successfully schedule hospital follow up PCP appointment 08/21/23) Follow-up Provider: PCP, Dr. Tamera Punt Specialist [facility] Follow-up appointment confirmed?: No Reason Specialist Follow-Up Not Confirmed: Patient has Specialist Provider Number and will Call for Appointment ([facility]) Do you need transportation to your follow-up appointment?: No Do you understand care options if your condition(s) worsen?: Yes-patient verbalized understanding  SDOH Interventions Today    Flowsheet Row Most Recent Value  SDOH Interventions   Food Insecurity Interventions Intervention Not Indicated  Transportation Interventions Intervention Not Indicated  [drives self at baseline,  husband providing transportation post-recent surgery]      TOC Interventions Today    Flowsheet Row Most Recent Value  TOC Interventions   TOC Interventions Discussed/Reviewed TOC Interventions Discussed,  Arranged PCP follow up within 7 days/Care Guide scheduled, Post op wound/incision care  [Patient declines need for ongoing/ further care management/ outreach,  no needs identified at time of TOC call today- declines enrollment in 30-day TOC program,  provided my direct contact information should questions/ concerns/ needs arise post-TOC call]      Interventions Today    Flowsheet Row Most Recent Value  Chronic Disease   Chronic disease during today's visit Other  [mechanical fall with hip/ humerus fractures,  surgical total hip replacement]  General Interventions   General Interventions Discussed/Reviewed General Interventions Discussed, Durable Medical Equipment (DME), Doctor Visits  Doctor Visits Discussed/Reviewed Doctor Visits Discussed, PCP, Specialist  Durable Medical Equipment (DME) Other  [confirmed currently requiring/ using assistive devices post-surgery- cane: reports did not use assistive devices prior to recent surgery]  PCP/Specialist Visits Compliance with follow-up visit  Exercise Interventions   Exercise Discussed/Reviewed Exercise Discussed  [facility] PT services- confirmed active,  encouraged patient's active participation/ engagement]  Education Interventions   Education Provided Provided Education  Provided Verbal Education On Other  [role of home health services with importance of participation/ ongoing engagement]  Nutrition Interventions   Nutrition Discussed/Reviewed Nutrition Discussed  Pharmacy Interventions   Pharmacy Dicussed/Reviewed Pharmacy Topics Discussed  [Full medication review with updating medication list in EHR per patient report]  Safety Interventions   Safety Discussed/Reviewed Safety Discussed, Fall Risk      Caryl Pina, RN, BSN, CCRN Alumnus RN CM Care Coordination/ Transition of Care-  [facility] Care Management [phone]: direct office

## 2023-08-20 ENCOUNTER — Encounter: Payer: Self-pay | Admitting: Family Medicine

## 2023-08-20 ENCOUNTER — Ambulatory Visit (INDEPENDENT_AMBULATORY_CARE_PROVIDER_SITE_OTHER): Payer: Medicare Other | Admitting: Family Medicine

## 2023-08-20 DIAGNOSIS — S42201A Unspecified fracture of upper end of right humerus, initial encounter for closed fracture: Secondary | ICD-10-CM | POA: Diagnosis not present

## 2023-08-20 DIAGNOSIS — Z780 Asymptomatic menopausal state: Secondary | ICD-10-CM | POA: Insufficient documentation

## 2023-08-20 DIAGNOSIS — D649 Anemia, unspecified: Secondary | ICD-10-CM | POA: Insufficient documentation

## 2023-08-20 DIAGNOSIS — Z09 Encounter for follow-up examination after completed treatment for conditions other than malignant neoplasm: Secondary | ICD-10-CM

## 2023-08-20 DIAGNOSIS — Z96641 Presence of right artificial hip joint: Secondary | ICD-10-CM | POA: Diagnosis not present

## 2023-08-20 NOTE — Progress Notes (Signed)
Established patient visit   Patient: Melissa Mcdowell   DOB: [birth year]   72 y.o. Female  MRN: [id number] Visit Date: 08/20/2023  Today's healthcare provider: Charlton Amor, DO   Chief Complaint  Patient presents with   Hospitalization Follow-up    SUBJECTIVE    Chief Complaint  Patient presents with   Hospitalization Follow-up   HPI   Pt presents for hospital follow up for humerus fracture and hip fracture. Pt notes pretty good pain control over the past few days with medicine given to her at discharge. She is post op hip surgery and doing well. Currently has a sling on her left arm for humerus fracture. Has not yet heard about follow up for arm. Has follow up for hip next week.   Review of Systems  Constitutional:  Negative for activity change, fatigue and fever.  Respiratory:  Negative for cough and shortness of breath.   Cardiovascular:  Negative for chest pain.  Gastrointestinal:  Negative for abdominal pain.  Genitourinary:  Negative for difficulty urinating.       Current Meds  Medication Sig   acetaminophen (TYLENOL) 325 MG tablet Take 2 tablets (650 mg total) by mouth every 6 (six) hours as needed for up to 30 doses for mild pain or moderate pain.   ascorbic acid (VITAMIN C) 500 MG tablet Take 1,000 mg by mouth daily.   aspirin 81 MG chewable tablet Chew 1 tablet (81 mg total) by mouth 2 (two) times daily.   estradiol (ESTRACE) 0.5 MG tablet Take 0.5 mg by mouth daily.   ezetimibe (ZETIA) 10 MG tablet Take 10 mg by mouth daily.   Fluticasone Propionate (KLS ALLER-FLO NA) Place into the nose.   ibuprofen (ADVIL) 600 MG tablet Take 1 tablet (600 mg total) by mouth every 6 (six) hours as needed for mild pain or moderate pain.   loratadine (CLARITIN) 10 MG tablet Take 10 mg by mouth daily.   oxyCODONE (ROXICODONE) 5 MG immediate release tablet Take 1 tablet (5 mg total) by mouth every 6 (six) hours as needed for up to 20 doses for severe pain.   tiZANidine  (ZANAFLEX) 2 MG tablet Take 1-2 tablets (2-4 mg total) by mouth every 8 (eight) hours as needed for muscle spasms.    OBJECTIVE    BP 124/79 (BP Location: Left Arm, Patient Position: Sitting, Cuff Size: Large)   Pulse 77   Ht 5\' 3"  (1.6 m)   Wt 176 lb (79.8 kg)   SpO2 94%   BMI 31.18 kg/m   Physical Exam Vitals and nursing note reviewed.  Constitutional:      General: She is not in acute distress.    Appearance: Normal appearance.  HENT:     Head: Normocephalic and atraumatic.     Right Ear: External ear normal.     Left Ear: External ear normal.     Nose: Nose normal.  Eyes:     Conjunctiva/sclera: Conjunctivae normal.  Cardiovascular:     Rate and Rhythm: Normal rate and regular rhythm.  Pulmonary:     Effort: Pulmonary effort is normal.     Breath sounds: Normal breath sounds.  Musculoskeletal:     Comments: Left arm sling Has walking cane  Neurological:     General: No focal deficit present.     Mental Status: She is alert and oriented to person, place, and time.  Psychiatric:        Mood and Affect: Mood normal.  Behavior: Behavior normal.        Thought Content: Thought content normal.        Judgment: Judgment normal.        ASSESSMENT & PLAN    Problem List Items Addressed This Visit       Musculoskeletal and Integument   Right humeral fracture    CT scan ordered of R arm. Pt referred to follow with Dr. Karie Schwalbe for further eval and management      Relevant Orders   CT SHOULDER RIGHT WO CONTRAST     Other   Hypercalcemia - Primary    Bmp repeated per recommendation from hospitalist.       Relevant Orders   Basic Metabolic Panel (BMET)   S/P total right hip arthroplasty    Pt doing well, has follow up next week with surgeon for wound check      Low hemoglobin   Relevant Orders   CBC w/Diff/Platelet   Post-menopausal    Due to patient having hip and humerus fracture recommend we get dexa scan to evaluate for osteoporosis      Relevant  Orders   DG Bone Density    No follow-ups on file.      No orders of the defined types were placed in this encounter.   Orders Placed This Encounter  Procedures   CT SHOULDER RIGHT WO CONTRAST    Standing Status:   Future    Standing Expiration Date:   08/19/2024    Order Specific Question:   Preferred imaging location?    Answer:   [facility]   DG Bone Density    Standing Status:   Future    Standing Expiration Date:   08/19/2024    Order Specific Question:   Reason for Exam (SYMPTOM  OR DIAGNOSIS REQUIRED)    Answer:   Eval bone density    Order Specific Question:   Preferred imaging location?    Answer:   [facility]   CBC w/Diff/Platelet   Basic Metabolic Panel (BMET)     Charlton Amor, DO  [facility] & Sports Medicine at [facility] [phone] (phone) [phone] (fax)  [facility]

## 2023-08-20 NOTE — Assessment & Plan Note (Signed)
Due to patient having hip and humerus fracture recommend we get dexa scan to evaluate for osteoporosis

## 2023-08-20 NOTE — Assessment & Plan Note (Signed)
Bmp repeated per recommendation from hospitalist.

## 2023-08-20 NOTE — Assessment & Plan Note (Signed)
Pt doing well, has follow up next week with surgeon for wound check

## 2023-08-20 NOTE — Assessment & Plan Note (Signed)
CT scan ordered of R arm. Pt referred to follow with Dr. Karie Schwalbe for further eval and management

## 2023-08-21 ENCOUNTER — Other Ambulatory Visit: Payer: Self-pay | Admitting: Family Medicine

## 2023-08-21 DIAGNOSIS — D649 Anemia, unspecified: Secondary | ICD-10-CM

## 2023-08-21 LAB — CBC WITH DIFFERENTIAL/PLATELET
Basophils Absolute: 0 10*3/uL (ref 0.0–0.2)
Basos: 0 %
EOS (ABSOLUTE): 0.1 10*3/uL (ref 0.0–0.4)
Eos: 1 %
Hematocrit: 31.4 % — ABNORMAL LOW (ref 34.0–46.6)
Hemoglobin: 9.8 g/dL — ABNORMAL LOW (ref 11.1–15.9)
Immature Grans (Abs): 0.1 10*3/uL (ref 0.0–0.1)
Immature Granulocytes: 1 %
Lymphocytes Absolute: 1.5 10*3/uL (ref 0.7–3.1)
Lymphs: 23 %
MCH: 30.9 pg (ref 26.6–33.0)
MCHC: 31.2 g/dL — ABNORMAL LOW (ref 31.5–35.7)
MCV: 99 fL — ABNORMAL HIGH (ref 79–97)
Monocytes Absolute: 0.5 10*3/uL (ref 0.1–0.9)
Monocytes: 9 %
Neutrophils Absolute: 4.2 10*3/uL (ref 1.4–7.0)
Neutrophils: 66 %
Platelets: 322 10*3/uL (ref 150–450)
RBC: 3.17 x10E6/uL — ABNORMAL LOW (ref 3.77–5.28)
RDW: 13.7 % (ref 11.7–15.4)
WBC: 6.3 10*3/uL (ref 3.4–10.8)

## 2023-08-21 LAB — BASIC METABOLIC PANEL
BUN/Creatinine Ratio: 14 (ref 12–28)
BUN: 12 mg/dL (ref 8–27)
CO2: 21 mmol/L (ref 20–29)
Calcium: 8.8 mg/dL (ref 8.7–10.3)
Chloride: 104 mmol/L (ref 96–106)
Creatinine, Ser: 0.86 mg/dL (ref 0.57–1.00)
Glucose: 92 mg/dL (ref 70–99)
Potassium: 4.1 mmol/L (ref 3.5–5.2)
Sodium: 143 mmol/L (ref 134–144)
eGFR: 72 mL/min/{1.73_m2} (ref >59)

## 2023-08-26 ENCOUNTER — Ambulatory Visit: Payer: Medicare Other

## 2023-08-26 DIAGNOSIS — S42201A Unspecified fracture of upper end of right humerus, initial encounter for closed fracture: Secondary | ICD-10-CM | POA: Diagnosis not present

## 2023-08-29 ENCOUNTER — Encounter: Payer: Self-pay | Admitting: Sports Medicine

## 2023-08-29 ENCOUNTER — Ambulatory Visit (INDEPENDENT_AMBULATORY_CARE_PROVIDER_SITE_OTHER): Payer: Medicare Other | Admitting: Sports Medicine

## 2023-08-29 ENCOUNTER — Telehealth: Payer: Self-pay | Admitting: Sports Medicine

## 2023-08-29 DIAGNOSIS — S42294D Other nondisplaced fracture of upper end of right humerus, subsequent encounter for fracture with routine healing: Secondary | ICD-10-CM

## 2023-08-29 DIAGNOSIS — S72001A Fracture of unspecified part of neck of right femur, initial encounter for closed fracture: Secondary | ICD-10-CM

## 2023-08-29 MED ORDER — CALCIUM 600+D PLUS MINERALS 600-400 MG-UNIT PO TABS
ORAL_TABLET | ORAL | 3 refills | Status: AC
Start: 2023-08-29 — End: ?

## 2023-08-29 MED ORDER — TRAMADOL HCL 50 MG PO TABS
50.0000 mg | ORAL_TABLET | Freq: Three times a day (TID) | ORAL | 0 refills | Status: DC | PRN
Start: 2023-08-29 — End: 2023-09-12

## 2023-08-29 NOTE — Assessment & Plan Note (Signed)
Status post hemiarthroplasty, management per orthopedic surgery.

## 2023-08-29 NOTE — Telephone Encounter (Signed)
[facility] called in regards to OT for patient they need clarification on shoulder and elbow movement please advise   Phone [phone]

## 2023-08-29 NOTE — Assessment & Plan Note (Signed)
Patient is presenting to me today 3 weeks after a fall, she did sustain a hip fracture and is status post hemiarthroplasty, she had a proximal humeral fracture as well, impacted with intact articular surface based on my interpretation of her x-rays and CT scan. Alignment was acceptable on the CT scan, I would like serial x-rays every couple of weeks, I will order this. She can go ahead and drop down to half days in the sling in the next week she will discontinue the sling altogether. Once I start to see some signs of healing I would like her to start physical therapy on her shoulder, she is currently getting home health PT for her hip. We do expect 3 months at least for healing. Tramadol for pain, discontinue oxycodone, adding calcium and vitamin D supplementation.

## 2023-08-29 NOTE — Telephone Encounter (Signed)
Home health advised.  ?

## 2023-08-29 NOTE — Progress Notes (Signed)
    Procedures performed today:    None.  Independent interpretation of notes and tests performed by another provider:   I personally reviewed her x-rays and shoulder CT, there is a multipart proximal humeral fracture, impacted, articular surface is intact and congruent, no angulation.  Brief History, Exam, Impression, and Recommendations:    Closed fracture of proximal end of right humerus Patient is presenting to me today 3 weeks after a fall, she did sustain a hip fracture and is status post hemiarthroplasty, she had a proximal humeral fracture as well, impacted with intact articular surface based on my interpretation of her x-rays and CT scan. Alignment was acceptable on the CT scan, I would like serial x-rays every couple of weeks, I will order this. She can go ahead and drop down to half days in the sling in the next week she will discontinue the sling altogether. Once I start to see some signs of healing I would like her to start physical therapy on her shoulder, she is currently getting home health PT for her hip. We do expect 3 months at least for healing. Tramadol for pain, discontinue oxycodone, adding calcium and vitamin D supplementation.  Closed right hip fracture, initial encounter [facility]) Status post hemiarthroplasty, management per orthopedic surgery.    ____________________________________________ Ihor Austin. Benjamin Stain, M.D., ABFM., CAQSM., AME. Primary Care and Sports Medicine [place] [facility]  Adjunct Professor of Family Medicine  [place] [employer] [facility] [employer]  Restaurant manager, fast food

## 2023-08-29 NOTE — Telephone Encounter (Signed)
No movement for the first week, then passive range of motion for the second week, active range of motion for the third week, strengthening for the fourth week.

## 2023-09-10 ENCOUNTER — Ambulatory Visit: Payer: Medicare Other

## 2023-09-10 DIAGNOSIS — Z780 Asymptomatic menopausal state: Secondary | ICD-10-CM

## 2023-09-12 ENCOUNTER — Encounter: Payer: Self-pay | Admitting: Sports Medicine

## 2023-09-12 ENCOUNTER — Ambulatory Visit (INDEPENDENT_AMBULATORY_CARE_PROVIDER_SITE_OTHER): Payer: Medicare Other | Admitting: Sports Medicine

## 2023-09-12 ENCOUNTER — Ambulatory Visit: Payer: Medicare Other

## 2023-09-12 DIAGNOSIS — S42291D Other displaced fracture of upper end of right humerus, subsequent encounter for fracture with routine healing: Secondary | ICD-10-CM | POA: Diagnosis not present

## 2023-09-12 DIAGNOSIS — S42294D Other nondisplaced fracture of upper end of right humerus, subsequent encounter for fracture with routine healing: Secondary | ICD-10-CM | POA: Diagnosis not present

## 2023-09-12 MED ORDER — TRAMADOL HCL 50 MG PO TABS: 50.0000 mg | ORAL_TABLET | Freq: Three times a day (TID) | ORAL | 0 refills | Status: DC | PRN

## 2023-09-12 NOTE — Progress Notes (Signed)
    Procedures performed today:    None.  Independent interpretation of notes and tests performed by another provider:     Brief History, Exam, Impression, and Recommendations:    Closed fracture of proximal end of right humerus This is a very pleasant 72 year old female, she is now approximately 5 weeks status post a fall where she sustained a hip fracture, she unfortunately needed hemiarthroplasty. She had a proximal humeral fracture as well, impacted with intact articular surface. This was based on my interpretation of her x-rays and a CT scan. We decided nonoperative treatment with a sling, she has recently discontinued the sling and she has started physical therapy. X-rays today did show good stability of the fracture compared to prior imaging. There is still some impaction but the articular surface remains intact. Exam shows excellent motion all things considered, she does have good passive external rotation to about 45 degrees, abduction passively to about 30 degrees. She is very limited in active motion as expected. She does not have any tenderness over the fracture itself. As she does get occasional pain we will refill her tramadol for use up to 3 times daily, she can continue ibuprofen as needed. Continue physical therapy, would like to see her back in about 2 weeks and we can do an x-ray before her visit as we have done previously.    ____________________________________________ Ihor Austin. Benjamin Stain, M.D., ABFM., CAQSM., AME. Primary Care and Sports Medicine [facility]  Adjunct Professor of Family Medicine  [place] [employer] [facility] [employer]  Restaurant manager, fast food

## 2023-09-12 NOTE — Assessment & Plan Note (Signed)
This is a very pleasant 72 year old female, she is now approximately 5 weeks status post a fall where she sustained a hip fracture, she unfortunately needed hemiarthroplasty. She had a proximal humeral fracture as well, impacted with intact articular surface. This was based on my interpretation of her x-rays and a CT scan. We decided nonoperative treatment with a sling, she has recently discontinued the sling and she has started physical therapy. X-rays today did show good stability of the fracture compared to prior imaging. There is still some impaction but the articular surface remains intact. Exam shows excellent motion all things considered, she does have good passive external rotation to about 45 degrees, abduction passively to about 30 degrees. She is very limited in active motion as expected. She does not have any tenderness over the fracture itself. As she does get occasional pain we will refill her tramadol for use up to 3 times daily, she can continue ibuprofen as needed. Continue physical therapy, would like to see her back in about 2 weeks and we can do an x-ray before her visit as we have done previously.

## 2023-09-17 ENCOUNTER — Encounter: Payer: Self-pay | Admitting: Family Medicine

## 2023-09-26 ENCOUNTER — Encounter: Payer: Self-pay | Admitting: Sports Medicine

## 2023-09-26 ENCOUNTER — Ambulatory Visit: Payer: Medicare Other

## 2023-09-26 ENCOUNTER — Ambulatory Visit: Payer: Medicare Other | Admitting: Family Medicine

## 2023-09-26 ENCOUNTER — Ambulatory Visit (INDEPENDENT_AMBULATORY_CARE_PROVIDER_SITE_OTHER): Payer: Medicare Other | Admitting: Sports Medicine

## 2023-09-26 DIAGNOSIS — S42291D Other displaced fracture of upper end of right humerus, subsequent encounter for fracture with routine healing: Secondary | ICD-10-CM | POA: Diagnosis not present

## 2023-09-26 DIAGNOSIS — S42294D Other nondisplaced fracture of upper end of right humerus, subsequent encounter for fracture with routine healing: Secondary | ICD-10-CM | POA: Diagnosis not present

## 2023-09-26 MED ORDER — GABAPENTIN 300 MG PO CAPS
ORAL_CAPSULE | ORAL | 3 refills | Status: DC
Start: 2023-09-26 — End: 2023-11-07

## 2023-09-26 MED ORDER — HYDROCODONE-ACETAMINOPHEN 5-325 MG PO TABS: 1.0000 | ORAL_TABLET | Freq: Three times a day (TID) | ORAL | 0 refills | Status: DC | PRN

## 2023-09-26 NOTE — Progress Notes (Signed)
    Procedures performed today:    None.  Independent interpretation of notes and tests performed by another provider:     Brief History, Exam, Impression, and Recommendations:    Closed fracture of proximal end of right humerus 7 weeks post proximal humeral impaction fracture, intact articular surface, x-rays today do show good bony callus. Range of motion is surprisingly good at this point in her convalescence, she has external rotation to about 40 degrees, abduction to 30, forward flexion to about 25. We will start physical therapy, 1 week passive range of motion followed by 2 weeks of active and 2 to 3 weeks of strengthening at [facility]. Tramadol is not controlling pain currently so we will switch to low-dose hydrocodone, I will also add some Neurontin. She will keep her cane with her. Return to see me in about 6 weeks.    ____________________________________________ Ihor Austin. Benjamin Stain, M.D., ABFM., CAQSM., AME. Primary Care and Sports Medicine [place] [facility]  Adjunct Professor of Family Medicine  [place] [employer] [facility] [employer]  Restaurant manager, fast food

## 2023-09-26 NOTE — Assessment & Plan Note (Signed)
7 weeks post proximal humeral impaction fracture, intact articular surface, x-rays today do show good bony callus. Range of motion is surprisingly good at this point in her convalescence, she has external rotation to about 40 degrees, abduction to 30, forward flexion to about 25. We will start physical therapy, 1 week passive range of motion followed by 2 weeks of active and 2 to 3 weeks of strengthening at [facility]. Tramadol is not controlling pain currently so we will switch to low-dose hydrocodone, I will also add some Neurontin. She will keep her cane with her. Return to see me in about 6 weeks.

## 2023-09-26 NOTE — Addendum Note (Signed)
Addended by: Monica Becton on: 09/26/2023 10:41 AM   Modules accepted: Orders

## 2023-10-03 ENCOUNTER — Ambulatory Visit: Payer: Medicare Other | Attending: Sports Medicine | Admitting: Physical Therapy

## 2023-10-03 ENCOUNTER — Encounter: Payer: Self-pay | Admitting: Physical Therapy

## 2023-10-03 DIAGNOSIS — M25611 Stiffness of right shoulder, not elsewhere classified: Secondary | ICD-10-CM | POA: Diagnosis present

## 2023-10-03 DIAGNOSIS — S42291D Other displaced fracture of upper end of right humerus, subsequent encounter for fracture with routine healing: Secondary | ICD-10-CM | POA: Diagnosis not present

## 2023-10-03 DIAGNOSIS — M6281 Muscle weakness (generalized): Secondary | ICD-10-CM | POA: Insufficient documentation

## 2023-10-03 DIAGNOSIS — M25511 Pain in right shoulder: Secondary | ICD-10-CM | POA: Diagnosis present

## 2023-10-03 NOTE — Therapy (Signed)
OUTPATIENT PHYSICAL THERAPY SHOULDER EVALUATION   Patient Name: Melissa Mcdowell MRN: [id number] DOB:1951/10/15, 72 y.o., female Today's Date: 10/03/2023  END OF SESSION:  PT End of Session - 10/03/23 0853     Visit Number 1    Date for PT Re-Evaluation 01/03/24    Authorization Type [employer] MC    PT Start Time [phone]    PT Stop Time 0930    PT Time Calculation (min) 39 min    Activity Tolerance Patient tolerated treatment well    Behavior During Therapy WFL for tasks assessed/performed             Past Medical History:  Diagnosis Date   Acid reflux    occasional   Anemia    no current med.   Arthritis    lower back, fingers   Complication of anesthesia    states has a small mouth   Dental crowns present    Headache(784.0)    sinus - 3-4 x/month   Hypercholesteremia    Mucous cyst of finger 06/18/2012   left long finger   Past Surgical History:  Procedure Laterality Date   ABDOMINAL HYSTERECTOMY     complete   APPENDECTOMY     CATARACT EXTRACTION Bilateral    CESAREAN SECTION     ECTOPIC PREGNANCY SURGERY     MASS EXCISION  07/10/2012   Procedure: MINOR EXCISION OF MASS;  Surgeon: Wyn Forster., MD;  Location: [facility];  Service: Orthopedics;  Laterality: Left;   OOPHORECTOMY     REPAIR OF COMPLEX TRACTION RETINAL DETACHMENT Left 12/23/2018   Procedure: REPAIR OF  RETINAL DETACHMENT LEFT EYE;  Surgeon: Carmela Rima, MD;  Location: [facility] OR;  Service: Ophthalmology;  Laterality: Left;   TOTAL HIP ARTHROPLASTY Right 08/12/2023   Procedure: TOTAL HIP ARTHROPLASTY ANTERIOR APPROACH;  Surgeon: Durene Romans, MD;  Location: [facility]S;  Service: Orthopedics;  Laterality: Right;   Patient Active Problem List   Diagnosis Date Noted   Low hemoglobin 08/20/2023   Post-menopausal 08/20/2023   S/P total right hip arthroplasty 08/12/2023   Closed right hip fracture, initial encounter (HCC) 08/11/2023   Closed fracture of proximal end of right humerus  08/11/2023   Hyperglycemia 08/11/2023   Hypercalcemia 08/11/2023   Primary insomnia 05/26/2023   Elevated blood pressure reading 05/26/2023    PCP: Tamera Punt, MD  REFERRING PROVIDER: Benjamin Stain, MD  REFERRING DIAG: right proximal humerus fracture  THERAPY DIAG:  Acute pain of right shoulder  Stiffness of right shoulder, not elsewhere classified  Muscle weakness (generalized)  Rationale for Evaluation and Treatment: Rehabilitation  ONSET DATE: 08/11/23  SUBJECTIVE:  SUBJECTIVE STATEMENT: Patient slipped and fell in the kitchen 08/11/23.  She sustained a hip fracture and a right proximal humerus fracture.  She underwent a right THA on 08/12/23.  They did conservative treatment for the shoulder.  She was in a sling for 3 weeks. She is having significant difficulty with active motions of the right arm  Hand dominance: Right  PERTINENT HISTORY: Right THA  PAIN:  Are you having pain? Yes: NPRS scale: 3/10 Pain location: right shoulder Pain description: sharp, jabs Aggravating factors: worse int eh PM, activity, reaching, pain up to 8/10 Relieving factors: sling, ibuprofen, pain medication pain can be a 2/10  PRECAUTIONS: None  RED FLAGS: None   WEIGHT BEARING RESTRICTIONS: No  FALLS:  Has patient fallen in last 6 months? Yes. Number of falls 1  LIVING ENVIRONMENT: Lives with: lives with their family Lives in: House/apartment Stairs: Yes: Internal: 10 steps; on right going up Has following equipment at home: None  OCCUPATION: retired  PLOF: Independent and does housework, some yardwork  PATIENT GOALS:less pain, better ROM, do hair and get dressed easier  NEXT MD VISIT:   OBJECTIVE:  Note: Objective measures were completed at Evaluation unless otherwise noted.  DIAGNOSTIC FINDINGS:   Healing right proximal humerus fracture  PATIENT SURVEYS:  FOTO 22  COGNITION: Overall cognitive status: Within functional limits for tasks assessed     SENSATION: WFL  POSTURE: Rounded shoulders fwd head  UPPER EXTREMITY ROM: all AROM and PROM cause pain!  Active ROM Right AROM eval Right PROM eval  Shoulder flexion 50 86  Shoulder extension    Shoulder abduction 40 50  Shoulder adduction    Shoulder internal rotation 25 30  Shoulder external rotation 45 50  Elbow flexion    Elbow extension    Wrist flexion    Wrist extension    Wrist ulnar deviation    Wrist radial deviation    Wrist pronation    Wrist supination    (Blank rows = not tested)  UPPER EXTREMITY MMT:  TESTED ONLY in her Available ROM  MMT Right eval Left eval  Shoulder flexion    Shoulder extension    Shoulder abduction    Shoulder adduction    Shoulder internal rotation 3+   Shoulder external rotation 3+   Middle trapezius    Lower trapezius    Elbow flexion    Elbow extension    Wrist flexion    Wrist extension    Wrist ulnar deviation    Wrist radial deviation    Wrist pronation    Wrist supination    Grip strength (lbs)    (Blank rows = not tested)  SHOULDER SPECIAL TESTS:  Not tested due to recent fracture JOINT MOBILITY TESTING:  Not tested due to recent fracture  PALPATION:  Very tight in the upper trap and neck, very tender and tight in the right upper arm, some swelling   TODAY'S TREATMENT:  DATE:     PATIENT EDUCATION: Education details: POC/HEP Person educated: Patient and Spouse Education method: Explanation, Facilities manager, Verbal cues, and Handouts Education comprehension: verbalized understanding  HOME EXERCISE PROGRAM: Access Code: [email or web address] URL: [email or web address] Date: 10/03/2023 Prepared by: Stacie Glaze  Exercises - Seated Shoulder Flexion Towel Slide at Table Top  - 2 x daily - 7 x weekly - 1 sets - 10 reps - 5 hold - Seated Elbow Flexion Shoulder Internal Rotation AAROM at Table with Towel  - 2 x daily - 7 x weekly - 1 sets - 10 reps - 3 hold - Seated Shoulder Shrugs  - 2 x daily - 7 x weekly - 1 sets - 10 reps - 3 hold - Supine Shoulder Press with Dowel  - 2 x daily - 7 x weekly - 1 sets - 10 reps - 3 hold - Supine Shoulder Flexion Extension AAROM with Dowel  - 2 x daily - 7 x weekly - 1 sets - 10 reps - 3 hold - Supine Shoulder Circles  - 2 x daily - 7 x weekly - 1 sets - 10 reps - 3 hold - Supine Shoulder External Rotation Stretch  - 2 x daily - 7 x weekly - 1 sets - 10 reps - 3 hold  ASSESSMENT:  CLINICAL IMPRESSION: Patient is a 72 y.o. female who was seen today for physical therapy evaluation and treatment for right proximal humerus fracture, not surgically repaired.  She had a fall on 08/11/23, she sustained the right proximal humerus fracture and an right hip fracture, she underwent a THA and is doing well, her shoulder however has very limited motion and strength, needs help to dress and do hair.  She has a lot of guarding holding her arm in a sling like position  OBJECTIVE IMPAIRMENTS: decreased activity tolerance, decreased coordination, decreased endurance, decreased ROM, decreased strength, increased edema, increased fascial restrictions, impaired perceived functional ability, increased muscle spasms, impaired flexibility, impaired UE functional use, improper body mechanics, postural dysfunction, and pain.   REHAB POTENTIAL: Good  CLINICAL DECISION MAKING: Evolving/moderate complexity  EVALUATION COMPLEXITY: Low   GOALS: Goals reviewed with patient? Yes  SHORT TERM GOALS: Target date: 10/18/23  Independent with initial HEP Goal status: INITIAL  LONG TERM GOALS: Target date: 01/03/24  Independent with advanced HEP Goal status: INITIAL  2.  Dress without  difficulty Goal status: INITIAL  3.  Reach into cabinet without pain Goal status: INITIAL  4.  Increase AROM of the right shoulder to 130 degrees Goal status: INITIAL  5.  Wash hair without help Goal status: INITIAL  6.  Increase ER to 65 degrees actively Goal status: INITIAL  PLAN:  PT FREQUENCY: 1-2x/week  PT DURATION: 12 weeks  PLANNED INTERVENTIONS: 97164- PT Re-evaluation, 97110-Therapeutic exercises, 97530- Therapeutic activity, 97112- Neuromuscular re-education, 97535- Self Care, 81191- Manual therapy, 97014- Electrical stimulation (unattended), 97016- Vasopneumatic device, Patient/Family education, Taping, Dry Needling, Cryotherapy, and Moist heat  PLAN FOR NEXT SESSION: slowly work on [place], PT 10/03/2023, 8:54 AM

## 2023-10-06 ENCOUNTER — Ambulatory Visit: Payer: Medicare Other

## 2023-10-06 DIAGNOSIS — M25511 Pain in right shoulder: Secondary | ICD-10-CM

## 2023-10-06 DIAGNOSIS — M25611 Stiffness of right shoulder, not elsewhere classified: Secondary | ICD-10-CM

## 2023-10-06 DIAGNOSIS — M6281 Muscle weakness (generalized): Secondary | ICD-10-CM

## 2023-10-06 NOTE — Therapy (Signed)
OUTPATIENT PHYSICAL THERAPY SHOULDER TREATMENT   Patient Name: Melissa Mcdowell MRN: [id number] DOB:11-02-1951, 72 y.o., female Today's Date: 10/06/2023  END OF SESSION:  PT End of Session - 10/06/23 1100     Visit Number 2    Date for PT Re-Evaluation 01/03/24    Authorization Type UHC MC    PT Start Time 1100    PT Stop Time 1145    PT Time Calculation (min) 45 min    Activity Tolerance Patient tolerated treatment well    Behavior During Therapy WFL for tasks assessed/performed              Past Medical History:  Diagnosis Date   Acid reflux    occasional   Anemia    no current med.   Arthritis    lower back, fingers   Complication of anesthesia    states has a small mouth   Dental crowns present    Headache(784.0)    sinus - 3-4 x/month   Hypercholesteremia    Mucous cyst of finger 06/18/2012   left long finger   Past Surgical History:  Procedure Laterality Date   ABDOMINAL HYSTERECTOMY     complete   APPENDECTOMY     CATARACT EXTRACTION Bilateral    CESAREAN SECTION     ECTOPIC PREGNANCY SURGERY     MASS EXCISION  07/10/2012   Procedure: MINOR EXCISION OF MASS;  Surgeon: Wyn Forster., MD;  Location: [facility];  Service: Orthopedics;  Laterality: Left;   OOPHORECTOMY     REPAIR OF COMPLEX TRACTION RETINAL DETACHMENT Left 12/23/2018   Procedure: REPAIR OF  RETINAL DETACHMENT LEFT EYE;  Surgeon: Carmela Rima, MD;  Location: [facility] OR;  Service: Ophthalmology;  Laterality: Left;   TOTAL HIP ARTHROPLASTY Right 08/12/2023   Procedure: TOTAL HIP ARTHROPLASTY ANTERIOR APPROACH;  Surgeon: Durene Romans, MD;  Location: [facility];  Service: Orthopedics;  Laterality: Right;   Patient Active Problem List   Diagnosis Date Noted   Low hemoglobin 08/20/2023   Post-menopausal 08/20/2023   S/P total right hip arthroplasty 08/12/2023   Closed right hip fracture, initial encounter (HCC) 08/11/2023   Closed fracture of proximal end of right humerus  08/11/2023   Hyperglycemia 08/11/2023   Hypercalcemia 08/11/2023   Primary insomnia 05/26/2023   Elevated blood pressure reading 05/26/2023    PCP: Tamera Punt, MD  REFERRING PROVIDER: Benjamin Stain, MD  REFERRING DIAG: right proximal humerus fracture  THERAPY DIAG:  Acute pain of right shoulder  Stiffness of right shoulder, not elsewhere classified  Muscle weakness (generalized)  Rationale for Evaluation and Treatment: Rehabilitation  ONSET DATE: 08/11/23  SUBJECTIVE:  SUBJECTIVE STATEMENT: I tried some of the exercises and was hurting the next day. I will try to do them earlier in the day and not so late   Hand dominance: Right  PERTINENT HISTORY: Right THA  PAIN:  Are you having pain? Yes: NPRS scale: 3/10 Pain location: right shoulder Pain description: sharp, jabs Aggravating factors: worse int eh PM, activity, reaching, pain up to 8/10 Relieving factors: sling, ibuprofen, pain medication pain can be a 2/10  PRECAUTIONS: None  RED FLAGS: None   WEIGHT BEARING RESTRICTIONS: No  FALLS:  Has patient fallen in last 6 months? Yes. Number of falls 1  LIVING ENVIRONMENT: Lives with: lives with their family Lives in: House/apartment Stairs: Yes: Internal: 10 steps; on right going up Has following equipment at home: None  OCCUPATION: retired  PLOF: Independent and does housework, some yardwork  PATIENT GOALS:less pain, better ROM, do hair and get dressed easier  NEXT MD VISIT:   OBJECTIVE:  Note: Objective measures were completed at Evaluation unless otherwise noted.  DIAGNOSTIC FINDINGS:  Healing right proximal humerus fracture  PATIENT SURVEYS:  FOTO 22  COGNITION: Overall cognitive status: Within functional limits for tasks assessed     SENSATION: WFL  POSTURE: Rounded  shoulders fwd head  UPPER EXTREMITY ROM: all AROM and PROM cause pain!  Active ROM Right AROM eval Right PROM eval  Shoulder flexion 50 86  Shoulder extension    Shoulder abduction 40 50  Shoulder adduction    Shoulder internal rotation 25 30  Shoulder external rotation 45 50  Elbow flexion    Elbow extension    Wrist flexion    Wrist extension    Wrist ulnar deviation    Wrist radial deviation    Wrist pronation    Wrist supination    (Blank rows = not tested)  UPPER EXTREMITY MMT:  TESTED ONLY in her Available ROM  MMT Right eval Left eval  Shoulder flexion    Shoulder extension    Shoulder abduction    Shoulder adduction    Shoulder internal rotation 3+   Shoulder external rotation 3+   Middle trapezius    Lower trapezius    Elbow flexion    Elbow extension    Wrist flexion    Wrist extension    Wrist ulnar deviation    Wrist radial deviation    Wrist pronation    Wrist supination    Grip strength (lbs)    (Blank rows = not tested)  SHOULDER SPECIAL TESTS:  Not tested due to recent fracture JOINT MOBILITY TESTING:  Not tested due to recent fracture  PALPATION:  Very tight in the upper trap and neck, very tender and tight in the right upper arm, some swelling   TODAY'S TREATMENT:  DATE:  10/06/23 Table slides flexion and abd x10 Seated ER slides on table x10 Wall slides flexion and abd x10  UBE L1 x2 mins each way  Supine PROM all directions  Supine AAROM with dowel flexion and abd x10 Supine isometrics in all directions, tolerated only some light pressure  Standing shoulder flexion, extension, abd x10   PATIENT EDUCATION: Education details: POC/HEP Person educated: Patient and Spouse Education method: Programmer, multimedia, Facilities manager, Verbal cues, and Handouts Education comprehension: verbalized  understanding  HOME EXERCISE PROGRAM: Access Code: [email or web address] URL: [email or web address] Date: 10/03/2023 Prepared by: Stacie Glaze  Exercises - Seated Shoulder Flexion Towel Slide at Table Top  - 2 x daily - 7 x weekly - 1 sets - 10 reps - 5 hold - Seated Elbow Flexion Shoulder Internal Rotation AAROM at Table with Towel  - 2 x daily - 7 x weekly - 1 sets - 10 reps - 3 hold - Seated Shoulder Shrugs  - 2 x daily - 7 x weekly - 1 sets - 10 reps - 3 hold - Supine Shoulder Press with Dowel  - 2 x daily - 7 x weekly - 1 sets - 10 reps - 3 hold - Supine Shoulder Flexion Extension AAROM with Dowel  - 2 x daily - 7 x weekly - 1 sets - 10 reps - 3 hold - Supine Shoulder Circles  - 2 x daily - 7 x weekly - 1 sets - 10 reps - 3 hold - Supine Shoulder External Rotation Stretch  - 2 x daily - 7 x weekly - 1 sets - 10 reps - 3 hold  ASSESSMENT:  CLINICAL IMPRESSION: Patient is a 72 y.o. female who was seen today for physical therapy treatment for right proximal humerus fracture, not surgically repaired.  Focused mostly on ROM today. Shoulder is still very limited motion and strength. Does better with resting position for R shoulder, not holding it in a sling position anymore and trying to let it hang by her side. Increased muscle guarding with PROM due to pain. Most limitations in ER and reports that way is the most painful.   OBJECTIVE IMPAIRMENTS: decreased activity tolerance, decreased coordination, decreased endurance, decreased ROM, decreased strength, increased edema, increased fascial restrictions, impaired perceived functional ability, increased muscle spasms, impaired flexibility, impaired UE functional use, improper body mechanics, postural dysfunction, and pain.   REHAB POTENTIAL: Good  CLINICAL DECISION MAKING: Evolving/moderate complexity  EVALUATION COMPLEXITY: Low   GOALS: Goals reviewed with patient? Yes  SHORT TERM GOALS: Target date: 10/18/23  Independent with  initial HEP Goal status: INITIAL  LONG TERM GOALS: Target date: 01/03/24  Independent with advanced HEP Goal status: INITIAL  2.  Dress without difficulty Goal status: INITIAL  3.  Reach into cabinet without pain Goal status: INITIAL  4.  Increase AROM of the right shoulder to 130 degrees Goal status: INITIAL  5.  Wash hair without help Goal status: INITIAL  6.  Increase ER to 65 degrees actively Goal status: INITIAL  PLAN:  PT FREQUENCY: 1-2x/week  PT DURATION: 12 weeks  PLANNED INTERVENTIONS: 97164- PT Re-evaluation, 97110-Therapeutic exercises, 97530- Therapeutic activity, 97112- Neuromuscular re-education, 97535- Self Care, 96045- Manual therapy, 97014- Electrical stimulation (unattended), 97016- Vasopneumatic device, Patient/Family education, Taping, Dry Needling, Cryotherapy, and Moist heat  PLAN FOR NEXT SESSION: slowly work on [facility], PT 10/06/2023, 11:42 AM

## 2023-10-08 ENCOUNTER — Ambulatory Visit: Payer: Medicare Other

## 2023-10-08 ENCOUNTER — Other Ambulatory Visit: Payer: Self-pay

## 2023-10-08 DIAGNOSIS — M25511 Pain in right shoulder: Secondary | ICD-10-CM

## 2023-10-08 DIAGNOSIS — M25611 Stiffness of right shoulder, not elsewhere classified: Secondary | ICD-10-CM

## 2023-10-08 DIAGNOSIS — M6281 Muscle weakness (generalized): Secondary | ICD-10-CM

## 2023-10-08 NOTE — Therapy (Signed)
OUTPATIENT PHYSICAL THERAPY SHOULDER TREATMENT   Patient Name: Melissa Mcdowell MRN: [id number] DOB:[birth year], 72 y.o., female Today's Date: 10/08/2023  END OF SESSION:  PT End of Session - 10/08/23 1037     Visit Number 3    Date for PT Re-Evaluation 01/03/24    Authorization Type [employer] [facility]    Progress Note Due on Visit 10    PT Start Time 0930    PT Stop Time 1022    PT Time Calculation (min) 52 min    Activity Tolerance Patient tolerated treatment well    Behavior During Therapy WFL for tasks assessed/performed               Past Medical History:  Diagnosis Date   Acid reflux    occasional   Anemia    no current med.   Arthritis    lower back, fingers   Complication of anesthesia    states has a small mouth   Dental crowns present    Headache(784.0)    sinus - 3-4 x/month   Hypercholesteremia    Mucous cyst of finger 06/18/2012   left long finger   Past Surgical History:  Procedure Laterality Date   ABDOMINAL HYSTERECTOMY     complete   APPENDECTOMY     CATARACT EXTRACTION Bilateral    CESAREAN SECTION     ECTOPIC PREGNANCY SURGERY     MASS EXCISION  07/10/2012   Procedure: MINOR EXCISION OF MASS;  Surgeon: Wyn Forster., MD;  Location: [facility];  Service: Orthopedics;  Laterality: Left;   OOPHORECTOMY     REPAIR OF COMPLEX TRACTION RETINAL DETACHMENT Left 12/23/2018   Procedure: REPAIR OF  RETINAL DETACHMENT LEFT EYE;  Surgeon: Carmela Rima, MD;  Location: [facility] OR;  Service: Ophthalmology;  Laterality: Left;   TOTAL HIP ARTHROPLASTY Right 08/12/2023   Procedure: TOTAL HIP ARTHROPLASTY ANTERIOR APPROACH;  Surgeon: Durene Romans, MD;  Location: [facility];  Service: Orthopedics;  Laterality: Right;   Patient Active Problem List   Diagnosis Date Noted   Low hemoglobin 08/20/2023   Post-menopausal 08/20/2023   S/P total right hip arthroplasty 08/12/2023   Closed right hip fracture, initial encounter (HCC) 08/11/2023   Closed  fracture of proximal end of right humerus 08/11/2023   Hyperglycemia 08/11/2023   Hypercalcemia 08/11/2023   Primary insomnia 05/26/2023   Elevated blood pressure reading 05/26/2023    PCP: Tamera Punt, MD  REFERRING PROVIDER: Benjamin Stain, MD  REFERRING DIAG: right proximal humerus fracture  THERAPY DIAG:  Acute pain of right shoulder  Stiffness of right shoulder, not elsewhere classified  Muscle weakness (generalized)  Rationale for Evaluation and Treatment: Rehabilitation  ONSET DATE: 08/11/23  SUBJECTIVE:  SUBJECTIVE STATEMENT: I tried some of the exercises and was hurting the next day. I will try to do them earlier in the day and not so late   Hand dominance: Right  PERTINENT HISTORY: Right THA  PAIN:  Are you having pain? Yes: NPRS scale: 3/10 Pain location: right shoulder Pain description: sharp, jabs Aggravating factors: worse int eh PM, activity, reaching, pain up to 8/10 Relieving factors: sling, ibuprofen, pain medication pain can be a 2/10  PRECAUTIONS: None  RED FLAGS: None   WEIGHT BEARING RESTRICTIONS: No  FALLS:  Has patient fallen in last 6 months? Yes. Number of falls 1  LIVING ENVIRONMENT: Lives with: lives with their family Lives in: House/apartment Stairs: Yes: Internal: 10 steps; on right going up Has following equipment at home: None  OCCUPATION: retired  PLOF: Independent and does housework, some yardwork  PATIENT GOALS:less pain, better ROM, do hair and get dressed easier  NEXT MD VISIT:   OBJECTIVE:  Note: Objective measures were completed at Evaluation unless otherwise noted.  DIAGNOSTIC FINDINGS:  Healing right proximal humerus fracture  PATIENT SURVEYS:  FOTO 22  COGNITION: Overall cognitive status: Within functional limits for tasks  assessed     SENSATION: WFL  POSTURE: Rounded shoulders fwd head  UPPER EXTREMITY ROM: all AROM and PROM cause pain!  Active ROM Right AROM eval Right PROM eval  Shoulder flexion 50 86  Shoulder extension    Shoulder abduction 40 50  Shoulder adduction    Shoulder internal rotation 25 30  Shoulder external rotation 45 50  Elbow flexion    Elbow extension    Wrist flexion    Wrist extension    Wrist ulnar deviation    Wrist radial deviation    Wrist pronation    Wrist supination    (Blank rows = not tested)  UPPER EXTREMITY MMT:  TESTED ONLY in her Available ROM  MMT Right eval Left eval  Shoulder flexion    Shoulder extension    Shoulder abduction    Shoulder adduction    Shoulder internal rotation 3+   Shoulder external rotation 3+   Middle trapezius    Lower trapezius    Elbow flexion    Elbow extension    Wrist flexion    Wrist extension    Wrist ulnar deviation    Wrist radial deviation    Wrist pronation    Wrist supination    Grip strength (lbs)    (Blank rows = not tested)  SHOULDER SPECIAL TESTS:  Not tested due to recent fracture JOINT MOBILITY TESTING:  Not tested due to recent fracture  PALPATION:  Very tight in the upper trap and neck, very tender and tight in the right upper arm, some swelling   TODAY'S TREATMENT:  DATE:  10/08/23: Seated pendulum with 1 and 2 # wt to provided tissue perfusion and jt distraction prior to manual stretching Supine for retrograde massage R humerus prior to stretching Supine for PROM R shoulder, all planes, light resistance for contract/relax technique  Seated table slides flex, abd, scaption, with small ball on table, then 6 " ball  Vasopneumatic compression R shoulder for 10 min at 34 degrees post session to reduce post ex soreness and edema  10/06/23 Table slides  flexion and abd x10 Seated ER slides on table x10 Wall slides flexion and abd x10  UBE L1 x2 mins each way  Supine PROM all directions  Supine AAROM with dowel flexion and abd x10 Supine isometrics in all directions, tolerated only some light pressure  Standing shoulder flexion, extension, abd x10   PATIENT EDUCATION: Education details: POC/HEP Person educated: Patient and Spouse Education method: Programmer, multimedia, Facilities manager, Verbal cues, and Handouts Education comprehension: verbalized understanding  HOME EXERCISE PROGRAM: Access Code: [email or web address] URL: [email or web address] Date: 10/03/2023 Prepared by: Stacie Glaze  Exercises - Seated Shoulder Flexion Towel Slide at Table Top  - 2 x daily - 7 x weekly - 1 sets - 10 reps - 5 hold - Seated Elbow Flexion Shoulder Internal Rotation AAROM at Table with Towel  - 2 x daily - 7 x weekly - 1 sets - 10 reps - 3 hold - Seated Shoulder Shrugs  - 2 x daily - 7 x weekly - 1 sets - 10 reps - 3 hold - Supine Shoulder Press with Dowel  - 2 x daily - 7 x weekly - 1 sets - 10 reps - 3 hold - Supine Shoulder Flexion Extension AAROM with Dowel  - 2 x daily - 7 x weekly - 1 sets - 10 reps - 3 hold - Supine Shoulder Circles  - 2 x daily - 7 x weekly - 1 sets - 10 reps - 3 hold - Supine Shoulder External Rotation Stretch  - 2 x daily - 7 x weekly - 1 sets - 10 reps - 3 hold  ASSESSMENT:  CLINICAL IMPRESSION: Patient is a 72 y.o. female who participated  today in skilled physical therapy treatment for right proximal humerus fracture, not surgically repaired.  Focused mostly on ROM today. Shoulder is still very limited motion and strength, improved from initial evaluation in all directions although not formally measured today. Should benefit from ongoing skilled physical therapy intervention to regain her motion .and function,  OBJECTIVE IMPAIRMENTS: decreased activity tolerance, decreased coordination, decreased endurance, decreased ROM,  decreased strength, increased edema, increased fascial restrictions, impaired perceived functional ability, increased muscle spasms, impaired flexibility, impaired UE functional use, improper body mechanics, postural dysfunction, and pain.   REHAB POTENTIAL: Good  CLINICAL DECISION MAKING: Evolving/moderate complexity  EVALUATION COMPLEXITY: Low   GOALS: Goals reviewed with patient? Yes  SHORT TERM GOALS: Target date: 10/18/23  Independent with initial HEP Goal status: INITIAL  LONG TERM GOALS: Target date: 01/03/24  Independent with advanced HEP Goal status: INITIAL  2.  Dress without difficulty Goal status: INITIAL  3.  Reach into cabinet without pain Goal status: INITIAL  4.  Increase AROM of the right shoulder to 130 degrees Goal status: INITIAL  5.  Wash hair without help Goal status: INITIAL  6.  Increase ER to 65 degrees actively Goal status: INITIAL  PLAN:  PT FREQUENCY: 1-2x/week  PT DURATION: 12 weeks  PLANNED INTERVENTIONS: 97164- PT Re-evaluation, 97110-Therapeutic exercises, 97530- Therapeutic activity, [id number]- Neuromuscular re-education, 97535- Self Care,  08657- Manual therapy, 97014- Electrical stimulation (unattended), 97016- Vasopneumatic device, Patient/Family education, Taping, Dry Needling, Cryotherapy, and Moist heat  PLAN FOR NEXT SESSION: slowly work on [employer], PT,DPT, OCS 10/08/2023, 10:38 AM

## 2023-10-20 ENCOUNTER — Ambulatory Visit: Payer: Medicare Other | Admitting: Physical Therapy

## 2023-10-22 ENCOUNTER — Ambulatory Visit: Payer: Medicare Other | Attending: Sports Medicine | Admitting: Physical Therapy

## 2023-10-22 ENCOUNTER — Encounter: Payer: Self-pay | Admitting: Physical Therapy

## 2023-10-22 DIAGNOSIS — M25511 Pain in right shoulder: Secondary | ICD-10-CM | POA: Diagnosis present

## 2023-10-22 DIAGNOSIS — M6281 Muscle weakness (generalized): Secondary | ICD-10-CM | POA: Diagnosis present

## 2023-10-22 DIAGNOSIS — M25611 Stiffness of right shoulder, not elsewhere classified: Secondary | ICD-10-CM | POA: Insufficient documentation

## 2023-10-22 NOTE — Therapy (Signed)
OUTPATIENT PHYSICAL THERAPY SHOULDER TREATMENT   Patient Name: Melissa Mcdowell MRN: [id number] DOB:05-Sep-1951, 72 y.o., female Today's Date: 10/22/2023  END OF SESSION:  PT End of Session - 10/22/23 0847     Visit Number 4    Date for PT Re-Evaluation 12/29/23    Authorization Type [employer] [facility] 4/16    PT Start Time 0845    PT Stop Time 0930    PT Time Calculation (min) 45 min    Activity Tolerance Patient tolerated treatment well    Behavior During Therapy WFL for tasks assessed/performed               Past Medical History:  Diagnosis Date   Acid reflux    occasional   Anemia    no current med.   Arthritis    lower back, fingers   Complication of anesthesia    states has a small mouth   Dental crowns present    Headache(784.0)    sinus - 3-4 x/month   Hypercholesteremia    Mucous cyst of finger 06/18/2012   left long finger   Past Surgical History:  Procedure Laterality Date   ABDOMINAL HYSTERECTOMY     complete   APPENDECTOMY     CATARACT EXTRACTION Bilateral    CESAREAN SECTION     ECTOPIC PREGNANCY SURGERY     MASS EXCISION  07/10/2012   Procedure: MINOR EXCISION OF MASS;  Surgeon: Wyn Forster., MD;  Location: [facility];  Service: Orthopedics;  Laterality: Left;   OOPHORECTOMY     REPAIR OF COMPLEX TRACTION RETINAL DETACHMENT Left 12/23/2018   Procedure: REPAIR OF  RETINAL DETACHMENT LEFT EYE;  Surgeon: Carmela Rima, MD;  Location: [facility] OR;  Service: Ophthalmology;  Laterality: Left;   TOTAL HIP ARTHROPLASTY Right 08/12/2023   Procedure: TOTAL HIP ARTHROPLASTY ANTERIOR APPROACH;  Surgeon: Durene Romans, MD;  Location: [facility];  Service: Orthopedics;  Laterality: Right;   Patient Active Problem List   Diagnosis Date Noted   Low hemoglobin 08/20/2023   Post-menopausal 08/20/2023   S/P total right hip arthroplasty 08/12/2023   Closed right hip fracture, initial encounter (HCC) 08/11/2023   Closed fracture of proximal end of right  humerus 08/11/2023   Hyperglycemia 08/11/2023   Hypercalcemia 08/11/2023   Primary insomnia 05/26/2023   Elevated blood pressure reading 05/26/2023    PCP: Tamera Punt, MD  REFERRING PROVIDER: Benjamin Stain, MD  REFERRING DIAG: right proximal humerus fracture  THERAPY DIAG:  Acute pain of right shoulder  Stiffness of right shoulder, not elsewhere classified  Muscle weakness (generalized)  Rationale for Evaluation and Treatment: Rehabilitation  ONSET DATE: 08/11/23  SUBJECTIVE:  SUBJECTIVE STATEMENT: I have been pretty sore due to the cold, did not get to do much exercise with the holiday  Hand dominance: Right  PERTINENT HISTORY: Right THA  PAIN:  Are you having pain? Yes: NPRS scale: 3/10 Pain location: right shoulder Pain description: sharp, jabs Aggravating factors: worse int eh PM, activity, reaching, pain up to 8/10 Relieving factors: sling, ibuprofen, pain medication pain can be a 2/10  PRECAUTIONS: None  RED FLAGS: None   WEIGHT BEARING RESTRICTIONS: No  FALLS:  Has patient fallen in last 6 months? Yes. Number of falls 1  LIVING ENVIRONMENT: Lives with: lives with their family Lives in: House/apartment Stairs: Yes: Internal: 10 steps; on right going up Has following equipment at home: None  OCCUPATION: retired  PLOF: Independent and does housework, some yardwork  PATIENT GOALS:less pain, better ROM, do hair and get dressed easier  NEXT MD VISIT:   OBJECTIVE:  Note: Objective measures were completed at Evaluation unless otherwise noted.  DIAGNOSTIC FINDINGS:  Healing right proximal humerus fracture  PATIENT SURVEYS:  FOTO 22  COGNITION: Overall cognitive status: Within functional limits for tasks assessed     SENSATION: WFL  POSTURE: Rounded shoulders fwd  head  UPPER EXTREMITY ROM: all AROM and PROM cause pain!  Active ROM Right AROM eval Right PROM eval Right  AROM 10/22/23  Shoulder flexion 50 86 90  Shoulder extension     Shoulder abduction 40 50 70  Shoulder adduction     Shoulder internal rotation 25 30 50  Shoulder external rotation 45 50 55  Elbow flexion     Elbow extension     Wrist flexion     Wrist extension     Wrist ulnar deviation     Wrist radial deviation     Wrist pronation     Wrist supination     (Blank rows = not tested)  UPPER EXTREMITY MMT:  TESTED ONLY in her Available ROM  MMT Right eval Left eval  Shoulder flexion    Shoulder extension    Shoulder abduction    Shoulder adduction    Shoulder internal rotation 3+   Shoulder external rotation 3+   Middle trapezius    Lower trapezius    Elbow flexion    Elbow extension    Wrist flexion    Wrist extension    Wrist ulnar deviation    Wrist radial deviation    Wrist pronation    Wrist supination    Grip strength (lbs)    (Blank rows = not tested)  SHOULDER SPECIAL TESTS:  Not tested due to recent fracture JOINT MOBILITY TESTING:  Not tested due to recent fracture  PALPATION:  Very tight in the upper trap and neck, very tender and tight in the right upper arm, some swelling   TODAY'S TREATMENT:  DATE:  10/22/23 UE ranger motions UBE level 1 x 4 minutes Red tband rows Red tband extension 3# wate bar extension and bicep curls, did some IR up behind back Wall slides and circles AROM see above Supine chest press, flexion Supine 1# ER/IR Supine 1# serratus push Supine 1# isometric circles PROM in supine  10/08/23: Seated pendulum with 1 and 2 # wt to provided tissue perfusion and jt distraction prior to manual stretching Supine for retrograde massage R humerus prior to stretching Supine for PROM R  shoulder, all planes, light resistance for contract/relax technique  Seated table slides flex, abd, scaption, with small ball on table, then 6 " ball  Vasopneumatic compression R shoulder for 10 min at 34 degrees post session to reduce post ex soreness and edema  10/06/23 Table slides flexion and abd x10 Seated ER slides on table x10 Wall slides flexion and abd x10  UBE L1 x2 mins each way  Supine PROM all directions  Supine AAROM with dowel flexion and abd x10 Supine isometrics in all directions, tolerated only some light pressure  Standing shoulder flexion, extension, abd x10   PATIENT EDUCATION: Education details: POC/HEP Person educated: Patient and Spouse Education method: Programmer, multimedia, Facilities manager, Verbal cues, and Handouts Education comprehension: verbalized understanding  HOME EXERCISE PROGRAM: Access Code: [email or web address] URL: [email or web address] Date: 10/03/2023 Prepared by: Stacie Glaze  Exercises - Seated Shoulder Flexion Towel Slide at Table Top  - 2 x daily - 7 x weekly - 1 sets - 10 reps - 5 hold - Seated Elbow Flexion Shoulder Internal Rotation AAROM at Table with Towel  - 2 x daily - 7 x weekly - 1 sets - 10 reps - 3 hold - Seated Shoulder Shrugs  - 2 x daily - 7 x weekly - 1 sets - 10 reps - 3 hold - Supine Shoulder Press with Dowel  - 2 x daily - 7 x weekly - 1 sets - 10 reps - 3 hold - Supine Shoulder Flexion Extension AAROM with Dowel  - 2 x daily - 7 x weekly - 1 sets - 10 reps - 3 hold - Supine Shoulder Circles  - 2 x daily - 7 x weekly - 1 sets - 10 reps - 3 hold - Supine Shoulder External Rotation Stretch  - 2 x daily - 7 x weekly - 1 sets - 10 reps - 3 hold  ASSESSMENT:  CLINICAL IMPRESSION: Patient is a 72 y.o. female who participated  today in skilled physical therapy treatment for right proximal humerus fracture, not surgically repaired.  I measured as noted above, good increase in ROM, still very tight with ER and abduction, reports  that she notices that she still has trouble dressing and taking arm behind back.  Some tightness and spasms in the upper trap and the rhomboids on the right  OBJECTIVE IMPAIRMENTS: decreased activity tolerance, decreased coordination, decreased endurance, decreased ROM, decreased strength, increased edema, increased fascial restrictions, impaired perceived functional ability, increased muscle spasms, impaired flexibility, impaired UE functional use, improper body mechanics, postural dysfunction, and pain.   REHAB POTENTIAL: Good  CLINICAL DECISION MAKING: Evolving/moderate complexity  EVALUATION COMPLEXITY: Low   GOALS: Goals reviewed with patient? Yes  SHORT TERM GOALS: Target date: 10/18/23  Independent with initial HEP Goal status: met 10/22/23  LONG TERM GOALS: Target date: 01/03/24  Independent with advanced HEP Goal status: INITIAL  2.  Dress without difficulty Goal status: ongoing 10/22/23  3.  Reach into cabinet without pain Goal status: INITIAL  4.  Increase AROM of the right shoulder to 130 degrees Goal status: ongoing 10/22/23  5.  Wash hair without help Goal status: INITIAL  6.  Increase ER to 65 degrees actively Goal status: INITIAL  PLAN:  PT FREQUENCY: 1-2x/week  PT DURATION: 12 weeks  PLANNED INTERVENTIONS: 97164- PT Re-evaluation, 97110-Therapeutic exercises, 97530- Therapeutic activity, 97112- Neuromuscular re-education, 97535- Self Care, 86578- Manual therapy, 97014- Electrical stimulation (unattended), 97016- Vasopneumatic device, Patient/Family education, Taping, Dry Needling, Cryotherapy, and Moist heat  PLAN FOR NEXT SESSION: continue to work on functional ROM and strenght   Cashae Weich W, PT 10/22/2023, 8:48 AM

## 2023-10-24 ENCOUNTER — Ambulatory Visit: Payer: Medicare Other | Admitting: Physical Therapy

## 2023-10-24 ENCOUNTER — Encounter: Payer: Self-pay | Admitting: Physical Therapy

## 2023-10-24 DIAGNOSIS — M6281 Muscle weakness (generalized): Secondary | ICD-10-CM

## 2023-10-24 DIAGNOSIS — M25611 Stiffness of right shoulder, not elsewhere classified: Secondary | ICD-10-CM

## 2023-10-24 DIAGNOSIS — M25511 Pain in right shoulder: Secondary | ICD-10-CM

## 2023-10-24 NOTE — Therapy (Signed)
OUTPATIENT PHYSICAL THERAPY SHOULDER TREATMENT   Patient Name: Melissa Mcdowell MRN: [id number] DOB:[birth year], 72 y.o., female Today's Date: 10/24/2023  END OF SESSION:  PT End of Session - 10/24/23 0934     Visit Number 5    Date for PT Re-Evaluation 12/29/23    PT Start Time 0934    PT Stop Time 1015    PT Time Calculation (min) 41 min    Activity Tolerance Patient tolerated treatment well    Behavior During Therapy [facility] for tasks assessed/performed               Past Medical History:  Diagnosis Date   Acid reflux    occasional   Anemia    no current med.   Arthritis    lower back, fingers   Complication of anesthesia    states has a small mouth   Dental crowns present    Headache(784.0)    sinus - 3-4 x/month   Hypercholesteremia    Mucous cyst of finger 06/18/2012   left long finger   Past Surgical History:  Procedure Laterality Date   ABDOMINAL HYSTERECTOMY     complete   APPENDECTOMY     CATARACT EXTRACTION Bilateral    CESAREAN SECTION     ECTOPIC PREGNANCY SURGERY     MASS EXCISION  07/10/2012   Procedure: MINOR EXCISION OF MASS;  Surgeon: Wyn Forster., MD;  Location: [facility];  Service: Orthopedics;  Laterality: Left;   OOPHORECTOMY     REPAIR OF COMPLEX TRACTION RETINAL DETACHMENT Left 12/23/2018   Procedure: REPAIR OF  RETINAL DETACHMENT LEFT EYE;  Surgeon: Carmela Rima, MD;  Location: [facility] OR;  Service: Ophthalmology;  Laterality: Left;   TOTAL HIP ARTHROPLASTY Right 08/12/2023   Procedure: TOTAL HIP ARTHROPLASTY ANTERIOR APPROACH;  Surgeon: Durene Romans, MD;  Location: [facility];  Service: Orthopedics;  Laterality: Right;   Patient Active Problem List   Diagnosis Date Noted   Low hemoglobin 08/20/2023   Post-menopausal 08/20/2023   S/P total right hip arthroplasty 08/12/2023   Closed right hip fracture, initial encounter (HCC) 08/11/2023   Closed fracture of proximal end of right humerus 08/11/2023   Hyperglycemia  08/11/2023   Hypercalcemia 08/11/2023   Primary insomnia 05/26/2023   Elevated blood pressure reading 05/26/2023    PCP: Tamera Punt, MD  REFERRING PROVIDER: Benjamin Stain, MD  REFERRING DIAG: right proximal humerus fracture  THERAPY DIAG:  Acute pain of right shoulder  Stiffness of right shoulder, not elsewhere classified  Muscle weakness (generalized)  Rationale for Evaluation and Treatment: Rehabilitation  ONSET DATE: 08/11/23  SUBJECTIVE:  SUBJECTIVE STATEMENT: "Today I'm ok, last night in the cold I was miserable"  Hand dominance: Right  PERTINENT HISTORY: Right THA  PAIN:  Are you having pain? Yes: NPRS scale: 0/10 Pain location: right shoulder Pain description: sharp, jabs Aggravating factors: worse int eh PM, activity, reaching, pain up to 8/10 Relieving factors: sling, ibuprofen, pain medication pain can be a 2/10  PRECAUTIONS: None  RED FLAGS: None   WEIGHT BEARING RESTRICTIONS: No  FALLS:  Has patient fallen in last 6 months? Yes. Number of falls 1  LIVING ENVIRONMENT: Lives with: lives with their family Lives in: House/apartment Stairs: Yes: Internal: 10 steps; on right going up Has following equipment at home: None  OCCUPATION: retired  PLOF: Independent and does housework, some yardwork  PATIENT GOALS:less pain, better ROM, do hair and get dressed easier  NEXT MD VISIT:   OBJECTIVE:  Note: Objective measures were completed at Evaluation unless otherwise noted.  DIAGNOSTIC FINDINGS:  Healing right proximal humerus fracture  PATIENT SURVEYS:  FOTO 22  COGNITION: Overall cognitive status: Within functional limits for tasks assessed     SENSATION: WFL  POSTURE: Rounded shoulders fwd head  UPPER EXTREMITY ROM: all AROM and PROM cause pain!  Active ROM  Right AROM eval Right PROM eval Right  AROM 10/22/23  Shoulder flexion 50 86 90  Shoulder extension     Shoulder abduction 40 50 70  Shoulder adduction     Shoulder internal rotation 25 30 50  Shoulder external rotation 45 50 55  Elbow flexion     Elbow extension     Wrist flexion     Wrist extension     Wrist ulnar deviation     Wrist radial deviation     Wrist pronation     Wrist supination     (Blank rows = not tested)  UPPER EXTREMITY MMT:  TESTED ONLY in her Available ROM  MMT Right eval Left eval  Shoulder flexion    Shoulder extension    Shoulder abduction    Shoulder adduction    Shoulder internal rotation 3+   Shoulder external rotation 3+   Middle trapezius    Lower trapezius    Elbow flexion    Elbow extension    Wrist flexion    Wrist extension    Wrist ulnar deviation    Wrist radial deviation    Wrist pronation    Wrist supination    Grip strength (lbs)    (Blank rows = not tested)  SHOULDER SPECIAL TESTS:  Not tested due to recent fracture JOINT MOBILITY TESTING:  Not tested due to recent fracture  PALPATION:  Very tight in the upper trap and neck, very tender and tight in the right upper arm, some swelling   TODAY'S TREATMENT:  DATE:  10/24/23 UBE level 1 x 3 minutes Each  AAROM flex, Ext, IR 1lb WaTE x10  AAROM RUE abduction Dowel x10 Red tband rows Red tband extension Tricep Ext RUE green 2x10 R shoulder PROM w/ end range holds   Gh jt modes grades 1-2  10/22/23 UE ranger motions UBE level 1 x 4 minutes Red tband rows Red tband extension 3# wate bar extension and bicep curls, did some IR up behind back Wall slides and circles AROM see above Supine chest press, flexion Supine 1# ER/IR Supine 1# serratus push Supine 1# isometric circles PROM in supine  10/08/23: Seated pendulum with 1 and 2  # wt to provided tissue perfusion and jt distraction prior to manual stretching Supine for retrograde massage R humerus prior to stretching Supine for PROM R shoulder, all planes, light resistance for contract/relax technique  Seated table slides flex, abd, scaption, with small ball on table, then 6 " ball  Vasopneumatic compression R shoulder for 10 min at 34 degrees post session to reduce post ex soreness and edema  10/06/23 Table slides flexion and abd x10 Seated ER slides on table x10 Wall slides flexion and abd x10  UBE L1 x2 mins each way  Supine PROM all directions  Supine AAROM with dowel flexion and abd x10 Supine isometrics in all directions, tolerated only some light pressure  Standing shoulder flexion, extension, abd x10   PATIENT EDUCATION: Education details: POC/HEP Person educated: Patient and Spouse Education method: Programmer, multimedia, Facilities manager, Verbal cues, and Handouts Education comprehension: verbalized understanding  HOME EXERCISE PROGRAM: Access Code: [email or web address] URL: [email or web address] Date: 10/03/2023 Prepared by: Stacie Glaze  Exercises - Seated Shoulder Flexion Towel Slide at Table Top  - 2 x daily - 7 x weekly - 1 sets - 10 reps - 5 hold - Seated Elbow Flexion Shoulder Internal Rotation AAROM at Table with Towel  - 2 x daily - 7 x weekly - 1 sets - 10 reps - 3 hold - Seated Shoulder Shrugs  - 2 x daily - 7 x weekly - 1 sets - 10 reps - 3 hold - Supine Shoulder Press with Dowel  - 2 x daily - 7 x weekly - 1 sets - 10 reps - 3 hold - Supine Shoulder Flexion Extension AAROM with Dowel  - 2 x daily - 7 x weekly - 1 sets - 10 reps - 3 hold - Supine Shoulder Circles  - 2 x daily - 7 x weekly - 1 sets - 10 reps - 3 hold - Supine Shoulder External Rotation Stretch  - 2 x daily - 7 x weekly - 1 sets - 10 reps - 3 hold  ASSESSMENT:  CLINICAL IMPRESSION: Patient is a 72 y.o. female who participated  today in skilled physical therapy treatment  for right proximal humerus fracture, not surgically repaired.  Some tightness and spasms in the upper trap and the rhomboids on the right remains. Continued with a more active session under light resistance. Postural cues needed with standing rows and extensions. Cue needed to prevent L shoulder elevation with flexion. Tightness with PROM with abduction being the most limited.  OBJECTIVE IMPAIRMENTS: decreased activity tolerance, decreased coordination, decreased endurance, decreased ROM, decreased strength, increased edema, increased fascial restrictions, impaired perceived functional ability, increased muscle spasms, impaired flexibility, impaired UE functional use, improper body mechanics, postural dysfunction, and pain.   REHAB POTENTIAL: Good  CLINICAL DECISION MAKING: Evolving/moderate complexity  EVALUATION COMPLEXITY: Low   GOALS: Goals reviewed with patient? Yes  SHORT TERM GOALS: Target date: 10/18/23  Independent with initial HEP Goal status: met 10/22/23  LONG TERM GOALS: Target date: 01/03/24  Independent with advanced HEP Goal status: INITIAL  2.  Dress without difficulty Goal status: ongoing 10/22/23  3.  Reach into cabinet without pain Goal status: INITIAL  4.  Increase AROM of the right shoulder to 130 degrees Goal status: ongoing 10/22/23  5.  Wash hair without help Goal status: INITIAL  6.  Increase ER to 65 degrees actively Goal status: INITIAL  PLAN:  PT FREQUENCY: 1-2x/week  PT DURATION: 12 weeks  PLANNED INTERVENTIONS: 97164- PT Re-evaluation, 97110-Therapeutic exercises, 97530- Therapeutic activity, 97112- Neuromuscular re-education, 97535- Self Care, 21308- Manual therapy, 97014- Electrical stimulation (unattended), 97016- Vasopneumatic device, Patient/Family education, Taping, Dry Needling, Cryotherapy, and Moist heat  PLAN FOR NEXT SESSION: continue to work on functional ROM and strenght   Grayce Sessions, PTA 10/24/2023, 9:34 AM

## 2023-10-27 ENCOUNTER — Encounter: Payer: Self-pay | Admitting: Physical Therapy

## 2023-10-27 ENCOUNTER — Ambulatory Visit: Payer: Medicare Other | Admitting: Physical Therapy

## 2023-10-27 DIAGNOSIS — M25511 Pain in right shoulder: Secondary | ICD-10-CM | POA: Diagnosis not present

## 2023-10-27 DIAGNOSIS — M6281 Muscle weakness (generalized): Secondary | ICD-10-CM

## 2023-10-27 DIAGNOSIS — M25611 Stiffness of right shoulder, not elsewhere classified: Secondary | ICD-10-CM

## 2023-10-27 NOTE — Therapy (Signed)
OUTPATIENT PHYSICAL THERAPY SHOULDER TREATMENT   Patient Name: Melissa Mcdowell MRN: [id number] DOB:12-May-1951, 72 y.o., female Today's Date: 10/27/2023  END OF SESSION:  PT End of Session - 10/27/23 0846     Visit Number 6    Date for PT Re-Evaluation 12/29/23    PT Start Time 0847    PT Stop Time 0930    PT Time Calculation (min) 43 min    Activity Tolerance Patient tolerated treatment well    Behavior During Therapy [facility] for tasks assessed/performed               Past Medical History:  Diagnosis Date   Acid reflux    occasional   Anemia    no current med.   Arthritis    lower back, fingers   Complication of anesthesia    states has a small mouth   Dental crowns present    Headache(784.0)    sinus - 3-4 x/month   Hypercholesteremia    Mucous cyst of finger 06/18/2012   left long finger   Past Surgical History:  Procedure Laterality Date   ABDOMINAL HYSTERECTOMY     complete   APPENDECTOMY     CATARACT EXTRACTION Bilateral    CESAREAN SECTION     ECTOPIC PREGNANCY SURGERY     MASS EXCISION  07/10/2012   Procedure: MINOR EXCISION OF MASS;  Surgeon: Wyn Forster., MD;  Location: [facility];  Service: Orthopedics;  Laterality: Left;   OOPHORECTOMY     REPAIR OF COMPLEX TRACTION RETINAL DETACHMENT Left 12/23/2018   Procedure: REPAIR OF  RETINAL DETACHMENT LEFT EYE;  Surgeon: Carmela Rima, MD;  Location: [facility] OR;  Service: Ophthalmology;  Laterality: Left;   TOTAL HIP ARTHROPLASTY Right 08/12/2023   Procedure: TOTAL HIP ARTHROPLASTY ANTERIOR APPROACH;  Surgeon: Durene Romans, MD;  Location: [facility];  Service: Orthopedics;  Laterality: Right;   Patient Active Problem List   Diagnosis Date Noted   Low hemoglobin 08/20/2023   Post-menopausal 08/20/2023   S/P total right hip arthroplasty 08/12/2023   Closed right hip fracture, initial encounter (HCC) 08/11/2023   Closed fracture of proximal end of right humerus 08/11/2023   Hyperglycemia  08/11/2023   Hypercalcemia 08/11/2023   Primary insomnia 05/26/2023   Elevated blood pressure reading 05/26/2023    PCP: Tamera Punt, MD  REFERRING PROVIDER: Benjamin Stain, MD  REFERRING DIAG: right proximal humerus fracture  THERAPY DIAG:  Acute pain of right shoulder  Stiffness of right shoulder, not elsewhere classified  Muscle weakness (generalized)  Rationale for Evaluation and Treatment: Rehabilitation  ONSET DATE: 08/11/23  SUBJECTIVE:  SUBJECTIVE STATEMENT: "A little stiff and sore, I guess part of it is the rain and everything, I do have airthritis"  Hand dominance: Right  PERTINENT HISTORY: Right THA  PAIN:  Are you having pain? Yes: NPRS scale: 1-2/10 Pain location: right shoulder Pain description: sharp, jabs Aggravating factors: worse int eh PM, activity, reaching, pain up to 8/10 Relieving factors: sling, ibuprofen, pain medication pain can be a 2/10  PRECAUTIONS: None  RED FLAGS: None   WEIGHT BEARING RESTRICTIONS: No  FALLS:  Has patient fallen in last 6 months? Yes. Number of falls 1  LIVING ENVIRONMENT: Lives with: lives with their family Lives in: House/apartment Stairs: Yes: Internal: 10 steps; on right going up Has following equipment at home: None  OCCUPATION: retired  PLOF: Independent and does housework, some yardwork  PATIENT GOALS:less pain, better ROM, do hair and get dressed easier  NEXT MD VISIT:   OBJECTIVE:  Note: Objective measures were completed at Evaluation unless otherwise noted.  DIAGNOSTIC FINDINGS:  Healing right proximal humerus fracture  PATIENT SURVEYS:  FOTO 22  COGNITION: Overall cognitive status: Within functional limits for tasks assessed     SENSATION: WFL  POSTURE: Rounded shoulders fwd head  UPPER EXTREMITY ROM: all  AROM and PROM cause pain!  Active ROM Right AROM eval Right PROM eval Right  AROM 10/22/23  Shoulder flexion 50 86 90  Shoulder extension     Shoulder abduction 40 50 70  Shoulder adduction     Shoulder internal rotation 25 30 50  Shoulder external rotation 45 50 55  Elbow flexion     Elbow extension     Wrist flexion     Wrist extension     Wrist ulnar deviation     Wrist radial deviation     Wrist pronation     Wrist supination     (Blank rows = not tested)  UPPER EXTREMITY MMT:  TESTED ONLY in her Available ROM  MMT Right eval Left eval  Shoulder flexion    Shoulder extension    Shoulder abduction    Shoulder adduction    Shoulder internal rotation 3+   Shoulder external rotation 3+   Middle trapezius    Lower trapezius    Elbow flexion    Elbow extension    Wrist flexion    Wrist extension    Wrist ulnar deviation    Wrist radial deviation    Wrist pronation    Wrist supination    Grip strength (lbs)    (Blank rows = not tested)  SHOULDER SPECIAL TESTS:  Not tested due to recent fracture JOINT MOBILITY TESTING:  Not tested due to recent fracture  PALPATION:  Very tight in the upper trap and neck, very tender and tight in the right upper arm, some swelling   TODAY'S TREATMENT:  DATE:  10/27/23 NuStep L5 x 6 min AAROM flex, Ext, IR 1lb WaTE x10  AAROM RUE abduction Dowel x10 Wall wash flex, Abd CW/CCW x 10 with pillow wcase  Triceps Ext 10lb 2x10 RUE IR/ER yellow 2x10 R shoulder PROM w/ end range holds   Gh jt modes grades 1-2  10/24/23 UBE level 1 x 3 minutes Each  AAROM flex, Ext, IR 1lb WaTE x10  AAROM RUE abduction Dowel x10 Red tband rows Red tband extension Tricep Ext RUE green 2x10 R shoulder PROM w/ end range holds   Gh jt modes grades 1-2  10/22/23 UE ranger motions UBE level 1 x 4 minutes Red  tband rows Red tband extension 3# wate bar extension and bicep curls, did some IR up behind back Wall slides and circles AROM see above Supine chest press, flexion Supine 1# ER/IR Supine 1# serratus push Supine 1# isometric circles PROM in supine  10/08/23: Seated pendulum with 1 and 2 # wt to provided tissue perfusion and jt distraction prior to manual stretching Supine for retrograde massage R humerus prior to stretching Supine for PROM R shoulder, all planes, light resistance for contract/relax technique  Seated table slides flex, abd, scaption, with small ball on table, then 6 " ball  Vasopneumatic compression R shoulder for 10 min at 34 degrees post session to reduce post ex soreness and edema  10/06/23 Table slides flexion and abd x10 Seated ER slides on table x10 Wall slides flexion and abd x10  UBE L1 x2 mins each way  Supine PROM all directions  Supine AAROM with dowel flexion and abd x10 Supine isometrics in all directions, tolerated only some light pressure  Standing shoulder flexion, extension, abd x10   PATIENT EDUCATION: Education details: POC/HEP Person educated: Patient and Spouse Education method: Programmer, multimedia, Facilities manager, Verbal cues, and Handouts Education comprehension: verbalized understanding  HOME EXERCISE PROGRAM: Access Code: [email or web address] URL: [email or web address] Date: 10/03/2023 Prepared by: Stacie Glaze  Exercises - Seated Shoulder Flexion Towel Slide at Table Top  - 2 x daily - 7 x weekly - 1 sets - 10 reps - 5 hold - Seated Elbow Flexion Shoulder Internal Rotation AAROM at Table with Towel  - 2 x daily - 7 x weekly - 1 sets - 10 reps - 3 hold - Seated Shoulder Shrugs  - 2 x daily - 7 x weekly - 1 sets - 10 reps - 3 hold - Supine Shoulder Press with Dowel  - 2 x daily - 7 x weekly - 1 sets - 10 reps - 3 hold - Supine Shoulder Flexion Extension AAROM with Dowel  - 2 x daily - 7 x weekly - 1 sets - 10 reps - 3 hold - Supine  Shoulder Circles  - 2 x daily - 7 x weekly - 1 sets - 10 reps - 3 hold - Supine Shoulder External Rotation Stretch  - 2 x daily - 7 x weekly - 1 sets - 10 reps - 3 hold  ASSESSMENT:  CLINICAL IMPRESSION: Patient is a 72 y.o. female who participated  today in skilled physical therapy treatment for right proximal humerus fracture, not surgically repaired.  Some tightness and spasms in the upper trap and the rhomboids on the right remains. Session consisted of active and passive motions. Tactile cues for UE placement needed with external rotation. Cue to relax with PROM, R shoulder passive abduction was biggest limitation  OBJECTIVE IMPAIRMENTS: decreased activity tolerance, decreased coordination, decreased endurance, decreased ROM, decreased strength, increased edema, increased  fascial restrictions, impaired perceived functional ability, increased muscle spasms, impaired flexibility, impaired UE functional use, improper body mechanics, postural dysfunction, and pain.   REHAB POTENTIAL: Good  CLINICAL DECISION MAKING: Evolving/moderate complexity  EVALUATION COMPLEXITY: Low   GOALS: Goals reviewed with patient? Yes  SHORT TERM GOALS: Target date: 10/18/23  Independent with initial HEP Goal status: met 10/22/23  LONG TERM GOALS: Target date: 01/03/24  Independent with advanced HEP Goal status: INITIAL  2.  Dress without difficulty Goal status: ongoing 10/22/23  3.  Reach into cabinet without pain Goal status: INITIAL  4.  Increase AROM of the right shoulder to 130 degrees Goal status: ongoing 10/22/23  5.  Wash hair without help Goal status: INITIAL  6.  Increase ER to 65 degrees actively Goal status: INITIAL  PLAN:  PT FREQUENCY: 1-2x/week  PT DURATION: 12 weeks  PLANNED INTERVENTIONS: 97164- PT Re-evaluation, 97110-Therapeutic exercises, 97530- Therapeutic activity, 97112- Neuromuscular re-education, 97535- Self Care, 16109- Manual therapy, 97014- Electrical stimulation  (unattended), 97016- Vasopneumatic device, Patient/Family education, Taping, Dry Needling, Cryotherapy, and Moist heat  PLAN FOR NEXT SESSION: continue to work on functional ROM and strenght   Grayce Sessions, PTA 10/27/2023, 8:47 AM

## 2023-10-27 NOTE — Therapy (Signed)
OUTPATIENT PHYSICAL THERAPY SHOULDER TREATMENT   Patient Name: Melissa Mcdowell MRN: [id number] DOB:[birth year], 72 y.o., female Today's Date: 10/27/2023  END OF SESSION:      Past Medical History:  Diagnosis Date   Acid reflux    occasional   Anemia    no current med.   Arthritis    lower back, fingers   Complication of anesthesia    states has a small mouth   Dental crowns present    Headache(784.0)    sinus - 3-4 x/month   Hypercholesteremia    Mucous cyst of finger 06/18/2012   left long finger   Past Surgical History:  Procedure Laterality Date   ABDOMINAL HYSTERECTOMY     complete   APPENDECTOMY     CATARACT EXTRACTION Bilateral    CESAREAN SECTION     ECTOPIC PREGNANCY SURGERY     MASS EXCISION  07/10/2012   Procedure: MINOR EXCISION OF MASS;  Surgeon: Wyn Forster., MD;  Location: [facility];  Service: Orthopedics;  Laterality: Left;   OOPHORECTOMY     REPAIR OF COMPLEX TRACTION RETINAL DETACHMENT Left 12/23/2018   Procedure: REPAIR OF  RETINAL DETACHMENT LEFT EYE;  Surgeon: Carmela Rima, MD;  Location: [facility] OR;  Service: Ophthalmology;  Laterality: Left;   TOTAL HIP ARTHROPLASTY Right 08/12/2023   Procedure: TOTAL HIP ARTHROPLASTY ANTERIOR APPROACH;  Surgeon: Durene Romans, MD;  Location: [facility];  Service: Orthopedics;  Laterality: Right;   Patient Active Problem List   Diagnosis Date Noted   Low hemoglobin 08/20/2023   Post-menopausal 08/20/2023   S/P total right hip arthroplasty 08/12/2023   Closed right hip fracture, initial encounter (HCC) 08/11/2023   Closed fracture of proximal end of right humerus 08/11/2023   Hyperglycemia 08/11/2023   Hypercalcemia 08/11/2023   Primary insomnia 05/26/2023   Elevated blood pressure reading 05/26/2023    PCP: Tamera Punt, MD  REFERRING PROVIDER: Benjamin Stain, MD  REFERRING DIAG: right proximal humerus fracture  THERAPY DIAG:  No diagnosis found.  Rationale for Evaluation and  Treatment: Rehabilitation  ONSET DATE: 08/11/23  SUBJECTIVE:                                                                                                                                                                                      SUBJECTIVE STATEMENT: "A little stiff and sore, I guess part of it is the rain and everything, I do have airthritis"  Hand dominance: Right  PERTINENT HISTORY: Right THA  PAIN:  Are you having pain? Yes: NPRS scale: 1-2/10 Pain location: right shoulder Pain description: sharp, jabs Aggravating factors: worse int eh PM, activity, reaching, pain up to  8/10 Relieving factors: sling, ibuprofen, pain medication pain can be a 2/10  PRECAUTIONS: None  RED FLAGS: None   WEIGHT BEARING RESTRICTIONS: No  FALLS:  Has patient fallen in last 6 months? Yes. Number of falls 1  LIVING ENVIRONMENT: Lives with: lives with their family Lives in: House/apartment Stairs: Yes: Internal: 10 steps; on right going up Has following equipment at home: None  OCCUPATION: retired  PLOF: Independent and does housework, some yardwork  PATIENT GOALS:less pain, better ROM, do hair and get dressed easier  NEXT MD VISIT:   OBJECTIVE:  Note: Objective measures were completed at Evaluation unless otherwise noted.  DIAGNOSTIC FINDINGS:  Healing right proximal humerus fracture  PATIENT SURVEYS:  FOTO 22  COGNITION: Overall cognitive status: Within functional limits for tasks assessed     SENSATION: WFL  POSTURE: Rounded shoulders fwd head  UPPER EXTREMITY ROM: all AROM and PROM cause pain!  Active ROM Right AROM eval Right PROM eval Right  AROM 10/22/23  Shoulder flexion 50 86 90  Shoulder extension     Shoulder abduction 40 50 70  Shoulder adduction     Shoulder internal rotation 25 30 50  Shoulder external rotation 45 50 55  Elbow flexion     Elbow extension     Wrist flexion     Wrist extension     Wrist ulnar deviation     Wrist  radial deviation     Wrist pronation     Wrist supination     (Blank rows = not tested)  UPPER EXTREMITY MMT:  TESTED ONLY in her Available ROM  MMT Right eval Left eval  Shoulder flexion    Shoulder extension    Shoulder abduction    Shoulder adduction    Shoulder internal rotation 3+   Shoulder external rotation 3+   Middle trapezius    Lower trapezius    Elbow flexion    Elbow extension    Wrist flexion    Wrist extension    Wrist ulnar deviation    Wrist radial deviation    Wrist pronation    Wrist supination    Grip strength (lbs)    (Blank rows = not tested)  SHOULDER SPECIAL TESTS:  Not tested due to recent fracture JOINT MOBILITY TESTING:  Not tested due to recent fracture  PALPATION:  Very tight in the upper trap and neck, very tender and tight in the right upper arm, some swelling   TODAY'S TREATMENT:                                                                                                                                         DATE:  10/29/23 UBE Red band rows and ext  Wall ladder Ball circles around waist with 2# ball R shoulder PROM w/ end range holds, Gh jt modes grades 1-2 Chest press with 2# WaTe    10/27/23  NuStep L5 x 6 min AAROM flex, Ext, IR 1lb WaTE x10  AAROM RUE abduction Dowel x10 Wall wash flex, Abd CW/CCW x 10 with pillow wcase  Triceps Ext 10lb 2x10 RUE IR/ER yellow 2x10 R shoulder PROM w/ end range holds   Gh jt modes grades 1-2  10/24/23 UBE level 1 x 3 minutes Each  AAROM flex, Ext, IR 1lb WaTE x10  AAROM RUE abduction Dowel x10 Red tband rows Red tband extension Tricep Ext RUE green 2x10 R shoulder PROM w/ end range holds   Gh jt modes grades 1-2  10/22/23 UE ranger motions UBE level 1 x 4 minutes Red tband rows Red tband extension 3# wate bar extension and bicep curls, did some IR up behind back Wall slides and circles AROM see above Supine chest press, flexion Supine 1# ER/IR Supine 1# serratus  push Supine 1# isometric circles PROM in supine  10/08/23: Seated pendulum with 1 and 2 # wt to provided tissue perfusion and jt distraction prior to manual stretching Supine for retrograde massage R humerus prior to stretching Supine for PROM R shoulder, all planes, light resistance for contract/relax technique  Seated table slides flex, abd, scaption, with small ball on table, then 6 " ball  Vasopneumatic compression R shoulder for 10 min at 34 degrees post session to reduce post ex soreness and edema  10/06/23 Table slides flexion and abd x10 Seated ER slides on table x10 Wall slides flexion and abd x10  UBE L1 x2 mins each way  Supine PROM all directions  Supine AAROM with dowel flexion and abd x10 Supine isometrics in all directions, tolerated only some light pressure  Standing shoulder flexion, extension, abd x10   PATIENT EDUCATION: Education details: POC/HEP Person educated: Patient and Spouse Education method: Programmer, multimedia, Facilities manager, Verbal cues, and Handouts Education comprehension: verbalized understanding  HOME EXERCISE PROGRAM: Access Code: [email or web address] URL: [email or web address] Date: 10/03/2023 Prepared by: Stacie Glaze  Exercises - Seated Shoulder Flexion Towel Slide at Table Top  - 2 x daily - 7 x weekly - 1 sets - 10 reps - 5 hold - Seated Elbow Flexion Shoulder Internal Rotation AAROM at Table with Towel  - 2 x daily - 7 x weekly - 1 sets - 10 reps - 3 hold - Seated Shoulder Shrugs  - 2 x daily - 7 x weekly - 1 sets - 10 reps - 3 hold - Supine Shoulder Press with Dowel  - 2 x daily - 7 x weekly - 1 sets - 10 reps - 3 hold - Supine Shoulder Flexion Extension AAROM with Dowel  - 2 x daily - 7 x weekly - 1 sets - 10 reps - 3 hold - Supine Shoulder Circles  - 2 x daily - 7 x weekly - 1 sets - 10 reps - 3 hold - Supine Shoulder External Rotation Stretch  - 2 x daily - 7 x weekly - 1 sets - 10 reps - 3 hold  ASSESSMENT:  CLINICAL  IMPRESSION: Patient is a 72 y.o. female who participated  today in skilled physical therapy treatment for right proximal humerus fracture, not surgically repaired.  Some tightness and spasms in the upper trap and the rhomboids on the right remains. Session consisted of active and passive motions. Tactile cues for UE placement needed with external rotation. Cue to relax with PROM, R shoulder passive abduction was biggest limitation  OBJECTIVE IMPAIRMENTS: decreased activity tolerance, decreased coordination, decreased endurance, decreased ROM, decreased strength, increased edema, increased fascial restrictions, impaired  perceived functional ability, increased muscle spasms, impaired flexibility, impaired UE functional use, improper body mechanics, postural dysfunction, and pain.   REHAB POTENTIAL: Good  CLINICAL DECISION MAKING: Evolving/moderate complexity  EVALUATION COMPLEXITY: Low   GOALS: Goals reviewed with patient? Yes  SHORT TERM GOALS: Target date: 10/18/23  Independent with initial HEP Goal status: met 10/22/23  LONG TERM GOALS: Target date: 01/03/24  Independent with advanced HEP Goal status: INITIAL  2.  Dress without difficulty Goal status: ongoing 10/22/23  3.  Reach into cabinet without pain Goal status: INITIAL  4.  Increase AROM of the right shoulder to 130 degrees Goal status: ongoing 10/22/23  5.  Wash hair without help Goal status: INITIAL  6.  Increase ER to 65 degrees actively Goal status: INITIAL  PLAN:  PT FREQUENCY: 1-2x/week  PT DURATION: 12 weeks  PLANNED INTERVENTIONS: 97164- PT Re-evaluation, 97110-Therapeutic exercises, 97530- Therapeutic activity, 97112- Neuromuscular re-education, 97535- Self Care, 82956- Manual therapy, 97014- Electrical stimulation (unattended), 97016- Vasopneumatic device, Patient/Family education, Taping, Dry Needling, Cryotherapy, and Moist heat  PLAN FOR NEXT SESSION: continue to work on functional ROM and  [employer], PT 10/27/2023, 3:33 PM

## 2023-10-29 ENCOUNTER — Other Ambulatory Visit: Payer: Self-pay | Admitting: Medical Genetics

## 2023-10-29 ENCOUNTER — Ambulatory Visit: Payer: Medicare Other

## 2023-10-29 DIAGNOSIS — M6281 Muscle weakness (generalized): Secondary | ICD-10-CM

## 2023-10-29 DIAGNOSIS — M25611 Stiffness of right shoulder, not elsewhere classified: Secondary | ICD-10-CM

## 2023-10-29 DIAGNOSIS — M25511 Pain in right shoulder: Secondary | ICD-10-CM

## 2023-11-03 ENCOUNTER — Ambulatory Visit: Payer: Medicare Other | Admitting: Physical Therapy

## 2023-11-03 ENCOUNTER — Encounter: Payer: Self-pay | Admitting: Physical Therapy

## 2023-11-03 DIAGNOSIS — M25511 Pain in right shoulder: Secondary | ICD-10-CM

## 2023-11-03 DIAGNOSIS — M25611 Stiffness of right shoulder, not elsewhere classified: Secondary | ICD-10-CM

## 2023-11-03 DIAGNOSIS — M6281 Muscle weakness (generalized): Secondary | ICD-10-CM

## 2023-11-03 NOTE — Therapy (Signed)
OUTPATIENT PHYSICAL THERAPY SHOULDER TREATMENT   Patient Name: Melissa Mcdowell MRN: [id number] DOB:[birth year], 72 y.o., female Today's Date: 11/03/2023  END OF SESSION:  PT End of Session - 11/03/23 0845     Visit Number 8    Date for PT Re-Evaluation 12/29/23    PT Start Time 0845    PT Stop Time 0930    PT Time Calculation (min) 45 min    Activity Tolerance Patient tolerated treatment well    Behavior During Therapy [facility] for tasks assessed/performed                Past Medical History:  Diagnosis Date   Acid reflux    occasional   Anemia    no current med.   Arthritis    lower back, fingers   Complication of anesthesia    states has a small mouth   Dental crowns present    Headache(784.0)    sinus - 3-4 x/month   Hypercholesteremia    Mucous cyst of finger 06/18/2012   left long finger   Past Surgical History:  Procedure Laterality Date   ABDOMINAL HYSTERECTOMY     complete   APPENDECTOMY     CATARACT EXTRACTION Bilateral    CESAREAN SECTION     ECTOPIC PREGNANCY SURGERY     MASS EXCISION  07/10/2012   Procedure: MINOR EXCISION OF MASS;  Surgeon: Wyn Forster., MD;  Location: [facility];  Service: Orthopedics;  Laterality: Left;   OOPHORECTOMY     REPAIR OF COMPLEX TRACTION RETINAL DETACHMENT Left 12/23/2018   Procedure: REPAIR OF  RETINAL DETACHMENT LEFT EYE;  Surgeon: Carmela Rima, MD;  Location: [facility] OR;  Service: Ophthalmology;  Laterality: Left;   TOTAL HIP ARTHROPLASTY Right 08/12/2023   Procedure: TOTAL HIP ARTHROPLASTY ANTERIOR APPROACH;  Surgeon: Durene Romans, MD;  Location: [facility];  Service: Orthopedics;  Laterality: Right;   Patient Active Problem List   Diagnosis Date Noted   Low hemoglobin 08/20/2023   Post-menopausal 08/20/2023   S/P total right hip arthroplasty 08/12/2023   Closed right hip fracture, initial encounter (HCC) 08/11/2023   Closed fracture of proximal end of right humerus 08/11/2023    Hyperglycemia 08/11/2023   Hypercalcemia 08/11/2023   Primary insomnia 05/26/2023   Elevated blood pressure reading 05/26/2023    PCP: Tamera Punt, MD  REFERRING PROVIDER: Benjamin Stain, MD  REFERRING DIAG: right proximal humerus fracture  THERAPY DIAG:  Acute pain of right shoulder  Stiffness of right shoulder, not elsewhere classified  Muscle weakness (generalized)  Rationale for Evaluation and Treatment: Rehabilitation  ONSET DATE: 08/11/23  SUBJECTIVE:  SUBJECTIVE STATEMENT: Doing ok, used the heating pad this morning   Hand dominance: Right  PERTINENT HISTORY: Right THA  PAIN:  Are you having pain? Yes: NPRS scale: 0/10 Pain location: right shoulder Pain description: sharp, jabs Aggravating factors: worse int eh PM, activity, reaching, pain up to 8/10 Relieving factors: sling, ibuprofen, pain medication pain can be a 2/10  PRECAUTIONS: None  RED FLAGS: None   WEIGHT BEARING RESTRICTIONS: No  FALLS:  Has patient fallen in last 6 months? Yes. Number of falls 1  LIVING ENVIRONMENT: Lives with: lives with their family Lives in: House/apartment Stairs: Yes: Internal: 10 steps; on right going up Has following equipment at home: None  OCCUPATION: retired  PLOF: Independent and does housework, some yardwork  PATIENT GOALS:less pain, better ROM, do hair and get dressed easier  NEXT MD VISIT:   OBJECTIVE:  Note: Objective measures were completed at Evaluation unless otherwise noted.  DIAGNOSTIC FINDINGS:  Healing right proximal humerus fracture  PATIENT SURVEYS:  FOTO 22  COGNITION: Overall cognitive status: Within functional limits for tasks assessed     SENSATION: WFL  POSTURE: Rounded shoulders fwd head  UPPER EXTREMITY ROM: all AROM and PROM cause pain!  Active ROM  Right AROM eval Right PROM eval Right  AROM 10/22/23  Shoulder flexion 50 86 90  Shoulder extension     Shoulder abduction 40 50 70  Shoulder adduction     Shoulder internal rotation 25 30 50  Shoulder external rotation 45 50 55  Elbow flexion     Elbow extension     Wrist flexion     Wrist extension     Wrist ulnar deviation     Wrist radial deviation     Wrist pronation     Wrist supination     (Blank rows = not tested)  UPPER EXTREMITY MMT:  TESTED ONLY in her Available ROM  MMT Right eval Left eval  Shoulder flexion    Shoulder extension    Shoulder abduction    Shoulder adduction    Shoulder internal rotation 3+   Shoulder external rotation 3+   Middle trapezius    Lower trapezius    Elbow flexion    Elbow extension    Wrist flexion    Wrist extension    Wrist ulnar deviation    Wrist radial deviation    Wrist pronation    Wrist supination    Grip strength (lbs)    (Blank rows = not tested)  SHOULDER SPECIAL TESTS:  Not tested due to recent fracture JOINT MOBILITY TESTING:  Not tested due to recent fracture  PALPATION:  Very tight in the upper trap and neck, very tender and tight in the right upper arm, some swelling   TODAY'S TREATMENT:  DATE:  11/03/23 UBE level 2 x 3 minutes Each  Wall ladder x5 flex and abd Towel stretch 5x5''  Ball circles around waist with 2# ball x10 each way  RUE IR/ER yellow 2x10 Shoulder abd 1lb 2x10 R shoulder PROM w/ end range holds   Gh jt modes grades 2-3  10/29/23 UBE L2 x16mins  Red band rows and ext 2x10  Wall ladder x5 Ball circles around waist with 2# ball x10 each way  R shoulder PROM w/ end range holds, Gh jt modes grades 2-3 Chest press with 3# WaTe 2x10 SA punches 2# 2x10  Sidelying abduction 2# 2x10 Sidelying ER 2# 2x10   10/27/23 NuStep L5 x 6 min AAROM flex,  Ext, IR 1lb WaTE x10  AAROM RUE abduction Dowel x10 Wall wash flex, Abd CW/CCW x 10 with pillow wcase  Triceps Ext 10lb 2x10 RUE IR/ER yellow 2x10 R shoulder PROM w/ end range holds   Gh jt modes grades 1-2  10/24/23 UBE level 1 x 3 minutes Each  AAROM flex, Ext, IR 1lb WaTE x10  AAROM RUE abduction Dowel x10 Red tband rows Red tband extension Tricep Ext RUE green 2x10 R shoulder PROM w/ end range holds   Gh jt modes grades 1-2  10/22/23 UE ranger motions UBE level 1 x 4 minutes Red tband rows Red tband extension 3# wate bar extension and bicep curls, did some IR up behind back Wall slides and circles AROM see above Supine chest press, flexion Supine 1# ER/IR Supine 1# serratus push Supine 1# isometric circles PROM in supine  10/08/23: Seated pendulum with 1 and 2 # wt to provided tissue perfusion and jt distraction prior to manual stretching Supine for retrograde massage R humerus prior to stretching Supine for PROM R shoulder, all planes, light resistance for contract/relax technique  Seated table slides flex, abd, scaption, with small ball on table, then 6 " ball  Vasopneumatic compression R shoulder for 10 min at 34 degrees post session to reduce post ex soreness and edema  10/06/23 Table slides flexion and abd x10 Seated ER slides on table x10 Wall slides flexion and abd x10  UBE L1 x2 mins each way  Supine PROM all directions  Supine AAROM with dowel flexion and abd x10 Supine isometrics in all directions, tolerated only some light pressure  Standing shoulder flexion, extension, abd x10   PATIENT EDUCATION: Education details: POC/HEP Person educated: Patient and Spouse Education method: Programmer, multimedia, Facilities manager, Verbal cues, and Handouts Education comprehension: verbalized understanding  HOME EXERCISE PROGRAM: Access Code: [email or web address] URL: [email or web address] Date: 10/03/2023 Prepared by: Stacie Glaze  Exercises - Seated Shoulder  Flexion Towel Slide at Table Top  - 2 x daily - 7 x weekly - 1 sets - 10 reps - 5 hold - Seated Elbow Flexion Shoulder Internal Rotation AAROM at Table with Towel  - 2 x daily - 7 x weekly - 1 sets - 10 reps - 3 hold - Seated Shoulder Shrugs  - 2 x daily - 7 x weekly - 1 sets - 10 reps - 3 hold - Supine Shoulder Press with Dowel  - 2 x daily - 7 x weekly - 1 sets - 10 reps - 3 hold - Supine Shoulder Flexion Extension AAROM with Dowel  - 2 x daily - 7 x weekly - 1 sets - 10 reps - 3 hold - Supine Shoulder Circles  - 2 x daily - 7 x weekly - 1 sets - 10 reps - 3 hold -  Supine Shoulder External Rotation Stretch  - 2 x daily - 7 x weekly - 1 sets - 10 reps - 3 hold  ASSESSMENT:  CLINICAL IMPRESSION: Patient is a 72 y.o. female who participated  today in skilled physical therapy treatment for right proximal humerus fracture, not surgically repaired.  Session consisted of active and passive motions as well as some light strengthening. Cues needed to relax with PROM, she tends to tighten up and muscle guard especially with abduction and flexion. Some end range pain with PROM. Tactile cue needed to keep RUE in position with ER/IR.    OBJECTIVE IMPAIRMENTS: decreased activity tolerance, decreased coordination, decreased endurance, decreased ROM, decreased strength, increased edema, increased fascial restrictions, impaired perceived functional ability, increased muscle spasms, impaired flexibility, impaired UE functional use, improper body mechanics, postural dysfunction, and pain.   REHAB POTENTIAL: Good  CLINICAL DECISION MAKING: Evolving/moderate complexity  EVALUATION COMPLEXITY: Low   GOALS: Goals reviewed with patient? Yes  SHORT TERM GOALS: Target date: 10/18/23  Independent with initial HEP Goal status: met 10/22/23  LONG TERM GOALS: Target date: 01/03/24  Independent with advanced HEP Goal status: INITIAL  2.  Dress without difficulty Goal status: ongoing 10/22/23  3.  Reach into  cabinet without pain Goal status: INITIAL  4.  Increase AROM of the right shoulder to 130 degrees Goal status: ongoing 10/22/23  5.  Wash hair without help Goal status: INITIAL  6.  Increase ER to 65 degrees actively Goal status: INITIAL  PLAN:  PT FREQUENCY: 1-2x/week  PT DURATION: 12 weeks  PLANNED INTERVENTIONS: 97164- PT Re-evaluation, 97110-Therapeutic exercises, 97530- Therapeutic activity, 97112- Neuromuscular re-education, 97535- Self Care, 16109- Manual therapy, 97014- Electrical stimulation (unattended), 97016- Vasopneumatic device, Patient/Family education, Taping, Dry Needling, Cryotherapy, and Moist heat  PLAN FOR NEXT SESSION: continue to work on functional ROM and strength   Grayce Sessions, PTA 11/03/2023, 8:46 AM

## 2023-11-06 ENCOUNTER — Encounter: Payer: Self-pay | Admitting: Physical Therapy

## 2023-11-06 ENCOUNTER — Ambulatory Visit: Payer: Medicare Other | Admitting: Physical Therapy

## 2023-11-06 DIAGNOSIS — M25511 Pain in right shoulder: Secondary | ICD-10-CM

## 2023-11-06 DIAGNOSIS — M6281 Muscle weakness (generalized): Secondary | ICD-10-CM

## 2023-11-06 DIAGNOSIS — M25611 Stiffness of right shoulder, not elsewhere classified: Secondary | ICD-10-CM

## 2023-11-06 NOTE — Therapy (Signed)
OUTPATIENT PHYSICAL THERAPY SHOULDER TREATMENT   Patient Name: Melissa Mcdowell MRN: [id number] DOB:11/19/1950, 72 y.o., female Today's Date: 11/06/2023  END OF SESSION:  PT End of Session - 11/06/23 0801     Visit Number 9    Date for PT Re-Evaluation 12/29/23    PT Start Time 0800    PT Stop Time 0845    PT Time Calculation (min) 45 min    Activity Tolerance Patient tolerated treatment well    Behavior During Therapy WFL for tasks assessed/performed                Past Medical History:  Diagnosis Date   Acid reflux    occasional   Anemia    no current med.   Arthritis    lower back, fingers   Complication of anesthesia    states has a small mouth   Dental crowns present    Headache(784.0)    sinus - 3-4 x/month   Hypercholesteremia    Mucous cyst of finger 06/18/2012   left long finger   Past Surgical History:  Procedure Laterality Date   ABDOMINAL HYSTERECTOMY     complete   APPENDECTOMY     CATARACT EXTRACTION Bilateral    CESAREAN SECTION     ECTOPIC PREGNANCY SURGERY     MASS EXCISION  07/10/2012   Procedure: MINOR EXCISION OF MASS;  Surgeon: Wyn Forster., MD;  Location: [facility];  Service: Orthopedics;  Laterality: Left;   OOPHORECTOMY     REPAIR OF COMPLEX TRACTION RETINAL DETACHMENT Left 12/23/2018   Procedure: REPAIR OF  RETINAL DETACHMENT LEFT EYE;  Surgeon: Carmela Rima, MD;  [facility];  Service: Ophthalmology;  Laterality: Left;   TOTAL HIP ARTHROPLASTY Right 08/12/2023   Procedure: TOTAL HIP ARTHROPLASTY ANTERIOR APPROACH;  Surgeon: Durene Romans, MD;  Location: [facility];  Service: Orthopedics;  Laterality: Right;   Patient Active Problem List   Diagnosis Date Noted   Low hemoglobin 08/20/2023   Post-menopausal 08/20/2023   S/P total right hip arthroplasty 08/12/2023   Closed right hip fracture, initial encounter (HCC) 08/11/2023   Closed fracture of proximal end of right humerus 08/11/2023    Hyperglycemia 08/11/2023   Hypercalcemia 08/11/2023   Primary insomnia 05/26/2023   Elevated blood pressure reading 05/26/2023    PCP: Tamera Punt, MD  REFERRING PROVIDER: Benjamin Stain, MD  REFERRING DIAG: right proximal humerus fracture  THERAPY DIAG:  Acute pain of right shoulder  Stiffness of right shoulder, not elsewhere classified  Muscle weakness (generalized)  Rationale for Evaluation and Treatment: Rehabilitation  ONSET DATE: 08/11/23  SUBJECTIVE:  SUBJECTIVE STATEMENT: "I feel fine except for a little sore in the arm"  Hand dominance: Right  PERTINENT HISTORY: Right THA  PAIN:  Are you having pain? Yes: NPRS scale: .5/10 Pain location: right shoulder Pain description: sharp, jabs Aggravating factors: worse int eh PM, activity, reaching, pain up to 8/10 Relieving factors: sling, ibuprofen, pain medication pain can be a 2/10  PRECAUTIONS: None  RED FLAGS: None   WEIGHT BEARING RESTRICTIONS: No  FALLS:  Has patient fallen in last 6 months? Yes. Number of falls 1  LIVING ENVIRONMENT: Lives with: lives with their family Lives in: House/apartment Stairs: Yes: Internal: 10 steps; on right going up Has following equipment at home: None  OCCUPATION: retired  PLOF: Independent and does housework, some yardwork  PATIENT GOALS:less pain, better ROM, do hair and get dressed easier  NEXT MD VISIT:   OBJECTIVE:  Note: Objective measures were completed at Evaluation unless otherwise noted.  DIAGNOSTIC FINDINGS:  Healing right proximal humerus fracture  PATIENT SURVEYS:  FOTO 22  COGNITION: Overall cognitive status: Within functional limits for tasks assessed     SENSATION: WFL  POSTURE: Rounded shoulders fwd head  UPPER EXTREMITY ROM: all AROM and PROM cause  pain!  Active ROM Right AROM eval Right PROM eval Right  AROM 10/22/23 Right AROM 11/06/23  Shoulder flexion 50 86 90 107  Shoulder extension      Shoulder abduction 40 50 70 90  Shoulder adduction      Shoulder internal rotation 25 30 50 60  Shoulder external rotation 45 50 55 69  Elbow flexion      Elbow extension      Wrist flexion      Wrist extension      Wrist ulnar deviation      Wrist radial deviation      Wrist pronation      Wrist supination      (Blank rows = not tested)  UPPER EXTREMITY MMT:  TESTED ONLY in her Available ROM  MMT Right eval Left eval  Shoulder flexion    Shoulder extension    Shoulder abduction    Shoulder adduction    Shoulder internal rotation 3+   Shoulder external rotation 3+   Middle trapezius    Lower trapezius    Elbow flexion    Elbow extension    Wrist flexion    Wrist extension    Wrist ulnar deviation    Wrist radial deviation    Wrist pronation    Wrist supination    Grip strength (lbs)    (Blank rows = not tested)  SHOULDER SPECIAL TESTS:  Not tested due to recent fracture JOINT MOBILITY TESTING:  Not tested due to recent fracture  PALPATION:  Very tight in the upper trap and neck, very tender and tight in the right upper arm, some swelling   TODAY'S TREATMENT:  DATE:  11/06/23 NuStep L4 x 4 min UBE L1.5 x2 min each  Wall Wash RUE Flex & abd x10 Seated Rows 10lb & Lats 15lb 2x10 Shoulder Flex & Abd 1lb 2x10 R shoulder PROM w/ end range holds   Gh jt modes grades 2-3  11/03/23 UBE level 2 x 3 minutes Each  Wall ladder x5 flex and abd Towel stretch 5x5''  Ball circles around waist with 2# ball x10 each way  RUE IR/ER yellow 2x10 Shoulder abd 1lb 2x10 R shoulder PROM w/ end range holds   Gh jt modes grades 2-3  10/29/23 UBE L2 x77mins  Red band rows and ext 2x10  Wall  ladder x5 Ball circles around waist with 2# ball x10 each way  R shoulder PROM w/ end range holds, Gh jt modes grades 2-3 Chest press with 3# WaTe 2x10 SA punches 2# 2x10  Sidelying abduction 2# 2x10 Sidelying ER 2# 2x10   10/27/23 NuStep L5 x 6 min AAROM flex, Ext, IR 1lb WaTE x10  AAROM RUE abduction Dowel x10 Wall wash flex, Abd CW/CCW x 10 with pillow wcase  Triceps Ext 10lb 2x10 RUE IR/ER yellow 2x10 R shoulder PROM w/ end range holds   Gh jt modes grades 1-2  10/24/23 UBE level 1 x 3 minutes Each  AAROM flex, Ext, IR 1lb WaTE x10  AAROM RUE abduction Dowel x10 Red tband rows Red tband extension Tricep Ext RUE green 2x10 R shoulder PROM w/ end range holds   Gh jt modes grades 1-2  10/22/23 UE ranger motions UBE level 1 x 4 minutes Red tband rows Red tband extension 3# wate bar extension and bicep curls, did some IR up behind back Wall slides and circles AROM see above Supine chest press, flexion Supine 1# ER/IR Supine 1# serratus push Supine 1# isometric circles PROM in supine  PATIENT EDUCATION: Education details: POC/HEP Person educated: Patient and Spouse Education method: Programmer, multimedia, Facilities manager, Verbal cues, and Handouts Education comprehension: verbalized understanding  HOME EXERCISE PROGRAM: Access Code: [email or web address] URL: [email or web address] Date: 10/03/2023 Prepared by: Stacie Glaze  Exercises - Seated Shoulder Flexion Towel Slide at Table Top  - 2 x daily - 7 x weekly - 1 sets - 10 reps - 5 hold - Seated Elbow Flexion Shoulder Internal Rotation AAROM at Table with Towel  - 2 x daily - 7 x weekly - 1 sets - 10 reps - 3 hold - Seated Shoulder Shrugs  - 2 x daily - 7 x weekly - 1 sets - 10 reps - 3 hold - Supine Shoulder Press with Dowel  - 2 x daily - 7 x weekly - 1 sets - 10 reps - 3 hold - Supine Shoulder Flexion Extension AAROM with Dowel  - 2 x daily - 7 x weekly - 1 sets - 10 reps - 3 hold - Supine Shoulder Circles  - 2 x  daily - 7 x weekly - 1 sets - 10 reps - 3 hold - Supine Shoulder External Rotation Stretch  - 2 x daily - 7 x weekly - 1 sets - 10 reps - 3 hold  ASSESSMENT:  CLINICAL IMPRESSION: Patient is a 72 y.o. female who participated  today in skilled physical therapy treatment for right proximal humerus fracture, not surgically repaired. She has progressed increasing her R shoulder AROM in all directions. She ahd also progressed towards other LTG's.  Session consisted of a a progression of machine level interventions, Cues needed to relax with PROM, she tends to tighten  up and muscle guard especially with abduction and flexion. Some end range pain with PROM.   OBJECTIVE IMPAIRMENTS: decreased activity tolerance, decreased coordination, decreased endurance, decreased ROM, decreased strength, increased edema, increased fascial restrictions, impaired perceived functional ability, increased muscle spasms, impaired flexibility, impaired UE functional use, improper body mechanics, postural dysfunction, and pain.   REHAB POTENTIAL: Good  CLINICAL DECISION MAKING: Evolving/moderate complexity  EVALUATION COMPLEXITY: Low   GOALS: Goals reviewed with patient? Yes  SHORT TERM GOALS: Target date: 10/18/23  Independent with initial HEP Goal status: met 10/22/23  LONG TERM GOALS: Target date: 01/03/24  Independent with advanced HEP Goal status: INITIAL  2.  Dress without difficulty Goal status: Progressing  11/06/23  3.  Reach into cabinet without pain Goal status: INITIAL  4.  Increase AROM of the right shoulder to 130 degrees Goal status: progressing 11/06/23  5.  Wash hair without help Goal status: INITIAL  6.  Increase ER to 65 degrees actively Goal status: Progressing 11/06/23  PLAN:  PT FREQUENCY: 1-2x/week  PT DURATION: 12 weeks  PLANNED INTERVENTIONS: 97164- PT Re-evaluation, 97110-Therapeutic exercises, 97530- Therapeutic activity, 97112- Neuromuscular re-education, 97535- Self  Care, 40981- Manual therapy, 97014- Electrical stimulation (unattended), 97016- Vasopneumatic device, Patient/Family education, Taping, Dry Needling, Cryotherapy, and Moist heat  PLAN FOR NEXT SESSION: continue to work on functional ROM and strength   Grayce Sessions, PTA 11/06/2023, 8:04 AM

## 2023-11-07 ENCOUNTER — Ambulatory Visit: Payer: Medicare Other

## 2023-11-07 ENCOUNTER — Ambulatory Visit (INDEPENDENT_AMBULATORY_CARE_PROVIDER_SITE_OTHER): Payer: Medicare Other | Admitting: Sports Medicine

## 2023-11-07 DIAGNOSIS — S42291D Other displaced fracture of upper end of right humerus, subsequent encounter for fracture with routine healing: Secondary | ICD-10-CM

## 2023-11-07 DIAGNOSIS — M810 Age-related osteoporosis without current pathological fracture: Secondary | ICD-10-CM | POA: Insufficient documentation

## 2023-11-07 DIAGNOSIS — M8580 Other specified disorders of bone density and structure, unspecified site: Secondary | ICD-10-CM | POA: Diagnosis not present

## 2023-11-07 NOTE — Assessment & Plan Note (Addendum)
Recent bone density test in October did show a T-score -2.2, combined this fairly low T-score with her recent osteoporotic type fractures I do think we should consider treatment for osteoporosis such as Prolia. I do agree with continuing calcium/vitamin D. I will leave this up to PCP.

## 2023-11-07 NOTE — Progress Notes (Addendum)
    Procedures performed today:    None.  Independent interpretation of notes and tests performed by another provider:   None.  Brief History, Exam, Impression, and Recommendations:    Closed fracture of proximal end of right humerus Exquisitely pleasant 72 year old female approximately 13 weeks status post proximal humeral fracture. She did have some bony callus on the last x-rays, she has now been in physical therapy and is making small improvements. On exam she does have fairly good passive range of motion however active range of motion to abduction is quite weak, the majority of her abduction is accomplished with scapular elevation. For this reason I do think we should evaluate for concurrent rotator cuff tear. X-rays today, MRI, referral to Dr. Cristy if massive cuff tear noted. Of note she is off of her hydrocodone  and gabapentin .  Only using Motrin  for pain.  Update: MRI does show a high-grade supraspinatus tear, I suspect this explains her lack of ability to abduct without scapular elevation. The fracture does continue to heal, and even though edema was still seen on the MRI she is for the most part nontender at the fracture itself.  Osteopenia Recent bone density test in October did show a T-score -2.2, combined this fairly low T-score with her recent osteoporotic type fractures I do think we should consider treatment for osteoporosis such as Prolia. I do agree with continuing calcium /vitamin D. I will leave this up to PCP.    ____________________________________________ Debby PARAS. Curtis, M.D., ABFM., CAQSM., AME. Primary Care and Sports Medicine [facility]  Adjunct Professor of [facility] Medicine  [employer]  Restaurant Manager, [employer]

## 2023-11-07 NOTE — Assessment & Plan Note (Addendum)
 Exquisitely pleasant 72 year old female approximately 13 weeks status post proximal humeral fracture. She did have some bony callus on the last x-rays, she has now been in physical therapy and is making small improvements. On exam she does have fairly good passive range of motion however active range of motion to abduction is quite weak, the majority of her abduction is accomplished with scapular elevation. For this reason I do think we should evaluate for concurrent rotator cuff tear. X-rays today, MRI, referral to Dr. Cristy if massive cuff tear noted. Of note she is off of her hydrocodone  and gabapentin .  Only using Motrin  for pain.  Update: MRI does show a high-grade supraspinatus tear, I suspect this explains her lack of ability to abduct without scapular elevation. The fracture does continue to heal, and even though edema was still seen on the MRI she is for the most part nontender at the fracture itself.

## 2023-11-10 ENCOUNTER — Encounter: Payer: Self-pay | Admitting: Physical Therapy

## 2023-11-10 ENCOUNTER — Ambulatory Visit: Payer: Medicare Other | Admitting: Physical Therapy

## 2023-11-10 DIAGNOSIS — M25511 Pain in right shoulder: Secondary | ICD-10-CM | POA: Diagnosis not present

## 2023-11-10 DIAGNOSIS — M6281 Muscle weakness (generalized): Secondary | ICD-10-CM

## 2023-11-10 DIAGNOSIS — M25611 Stiffness of right shoulder, not elsewhere classified: Secondary | ICD-10-CM

## 2023-11-10 NOTE — Therapy (Signed)
OUTPATIENT PHYSICAL THERAPY SHOULDER TREATMENT Progress Note Reporting Period 10/03/23 to 11/10/23  See note below for Objective Data and Assessment of Progress/Goals.      Patient Name: Melissa Mcdowell MRN: [id number] DOB:January 23, 1951, 72 y.o., female Today's Date: 11/10/2023  END OF SESSION:  PT End of Session - 11/10/23 0808     Visit Number 10    Authorization Type [employer] [facility] 9/16    PT Start Time 0800    PT Stop Time 0845    PT Time Calculation (min) 45 min    Activity Tolerance Patient tolerated treatment well    Behavior During Therapy WFL for tasks assessed/performed                Past Medical History:  Diagnosis Date   Acid reflux    occasional   Anemia    no current med.   Arthritis    lower back, fingers   Complication of anesthesia    states has a small mouth   Dental crowns present    Headache(784.0)    sinus - 3-4 x/month   Hypercholesteremia    Mucous cyst of finger 06/18/2012   left long finger   Past Surgical History:  Procedure Laterality Date   ABDOMINAL HYSTERECTOMY     complete   APPENDECTOMY     CATARACT EXTRACTION Bilateral    CESAREAN SECTION     ECTOPIC PREGNANCY SURGERY     MASS EXCISION  07/10/2012   Procedure: MINOR EXCISION OF MASS;  Surgeon: Wyn Forster., MD;  Location: [facility];  Service: Orthopedics;  Laterality: Left;   OOPHORECTOMY     REPAIR OF COMPLEX TRACTION RETINAL DETACHMENT Left 12/23/2018   Procedure: REPAIR OF  RETINAL DETACHMENT LEFT EYE;  Surgeon: Carmela Rima, MD;  Location: [facility] OR;  Service: Ophthalmology;  Laterality: Left;   TOTAL HIP ARTHROPLASTY Right 08/12/2023   Procedure: TOTAL HIP ARTHROPLASTY ANTERIOR APPROACH;  Surgeon: Durene Romans, MD;  Location: [facility];  Service: Orthopedics;  Laterality: Right;   Patient Active Problem List   Diagnosis Date Noted   Osteopenia 11/07/2023   Low hemoglobin 08/20/2023   Post-menopausal 08/20/2023   S/P total right hip arthroplasty  08/12/2023   Closed right hip fracture, initial encounter (HCC) 08/11/2023   Closed fracture of proximal end of right humerus 08/11/2023   Hyperglycemia 08/11/2023   Hypercalcemia 08/11/2023   Primary insomnia 05/26/2023   Elevated blood pressure reading 05/26/2023    PCP: Tamera Punt, MD  REFERRING PROVIDER: Benjamin Stain, MD  REFERRING DIAG: right proximal humerus fracture  THERAPY DIAG:  Acute pain of right shoulder  Stiffness of right shoulder, not elsewhere classified  Muscle weakness (generalized)  Rationale for Evaluation and Treatment: Rehabilitation  ONSET DATE: 08/11/23  SUBJECTIVE:  SUBJECTIVE STATEMENT: She saw Dr. Karie Schwalbe last week.  She will have an MRI tomorrow  Hand dominance: Right  PERTINENT HISTORY: Right THA  PAIN:  Are you having pain? Yes: NPRS scale: .5/10 Pain location: right shoulder Pain description: sharp, jabs Aggravating factors: worse int eh PM, activity, reaching, pain up to 8/10 Relieving factors: sling, ibuprofen, pain medication pain can be a 2/10  PRECAUTIONS: None  RED FLAGS: None   WEIGHT BEARING RESTRICTIONS: No  FALLS:  Has patient fallen in last 6 months? Yes. Number of falls 1  LIVING ENVIRONMENT: Lives with: lives with their family Lives in: House/apartment Stairs: Yes: Internal: 10 steps; on right going up Has following equipment at home: None  OCCUPATION: retired  PLOF: Independent and does housework, some yardwork  PATIENT GOALS:less pain, better ROM, do hair and get dressed easier  NEXT MD VISIT:   OBJECTIVE:  Note: Objective measures were completed at Evaluation unless otherwise noted.  DIAGNOSTIC FINDINGS:  Healing right proximal humerus fracture  PATIENT SURVEYS:  FOTO 22  COGNITION: Overall cognitive status: Within functional  limits for tasks assessed     SENSATION: WFL  POSTURE: Rounded shoulders fwd head  UPPER EXTREMITY ROM: all AROM and PROM cause pain!  Active ROM Right AROM eval Right PROM eval Right  AROM 10/22/23 Right AROM 11/06/23  Shoulder flexion 50 86 90 107  Shoulder extension      Shoulder abduction 40 50 70 90  Shoulder adduction      Shoulder internal rotation 25 30 50 60  Shoulder external rotation 45 50 55 69  Elbow flexion      Elbow extension      Wrist flexion      Wrist extension      Wrist ulnar deviation      Wrist radial deviation      Wrist pronation      Wrist supination      (Blank rows = not tested)  UPPER EXTREMITY MMT:  TESTED ONLY in her Available ROM  MMT Right eval Left eval  Shoulder flexion    Shoulder extension    Shoulder abduction    Shoulder adduction    Shoulder internal rotation 3+   Shoulder external rotation 3+   Middle trapezius    Lower trapezius    Elbow flexion    Elbow extension    Wrist flexion    Wrist extension    Wrist ulnar deviation    Wrist radial deviation    Wrist pronation    Wrist supination    Grip strength (lbs)    (Blank rows = not tested)  SHOULDER SPECIAL TESTS:  Not tested due to recent fracture JOINT MOBILITY TESTING:  Not tested due to recent fracture  PALPATION:  Very tight in the upper trap and neck, very tender and tight in the right upper arm, some swelling   TODAY'S TREATMENT:  DATE:  11/10/23 Nustep level 4 x 5 minutes UBE level 2 x 5 minutes Wall slides, circles and overhead side to side Wate bar extension Wate bar total 7# biceps Wte bar overhead press with a little assist 5# chest press  15# lats Yellow tband ER/IR PROM in sitting Red tband row and extension Physioball chest pass  11/06/23 NuStep L4 x 4 min UBE L1.5 x2 min each  Wall Wash RUE Flex  & abd x10 Seated Rows 10lb & Lats 15lb 2x10 Shoulder Flex & Abd 1lb 2x10 R shoulder PROM w/ end range holds   Gh jt modes grades 2-3  11/03/23 UBE level 2 x 3 minutes Each  Wall ladder x5 flex and abd Towel stretch 5x5''  Ball circles around waist with 2# ball x10 each way  RUE IR/ER yellow 2x10 Shoulder abd 1lb 2x10 R shoulder PROM w/ end range holds   Gh jt modes grades 2-3  10/29/23 UBE L2 x49mins  Red band rows and ext 2x10  Wall ladder x5 Ball circles around waist with 2# ball x10 each way  R shoulder PROM w/ end range holds, Gh jt modes grades 2-3 Chest press with 3# WaTe 2x10 SA punches 2# 2x10  Sidelying abduction 2# 2x10 Sidelying ER 2# 2x10   10/27/23 NuStep L5 x 6 min AAROM flex, Ext, IR 1lb WaTE x10  AAROM RUE abduction Dowel x10 Wall wash flex, Abd CW/CCW x 10 with pillow wcase  Triceps Ext 10lb 2x10 RUE IR/ER yellow 2x10 R shoulder PROM w/ end range holds   Gh jt modes grades 1-2  10/24/23 UBE level 1 x 3 minutes Each  AAROM flex, Ext, IR 1lb WaTE x10  AAROM RUE abduction Dowel x10 Red tband rows Red tband extension Tricep Ext RUE green 2x10 R shoulder PROM w/ end range holds   Gh jt modes grades 1-2  10/22/23 UE ranger motions UBE level 1 x 4 minutes Red tband rows Red tband extension 3# wate bar extension and bicep curls, did some IR up behind back Wall slides and circles AROM see above Supine chest press, flexion Supine 1# ER/IR Supine 1# serratus push Supine 1# isometric circles PROM in supine  PATIENT EDUCATION: Education details: POC/HEP Person educated: Patient and Spouse Education method: Programmer, multimedia, Facilities manager, Verbal cues, and Handouts Education comprehension: verbalized understanding  HOME EXERCISE PROGRAM: Access Code: [email or web address] URL: [email or web address] Date: 10/03/2023 Prepared by: Stacie Glaze  Exercises - Seated Shoulder Flexion Towel Slide at Table Top  - 2 x daily - 7 x weekly - 1 sets - 10  reps - 5 hold - Seated Elbow Flexion Shoulder Internal Rotation AAROM at Table with Towel  - 2 x daily - 7 x weekly - 1 sets - 10 reps - 3 hold - Seated Shoulder Shrugs  - 2 x daily - 7 x weekly - 1 sets - 10 reps - 3 hold - Supine Shoulder Press with Dowel  - 2 x daily - 7 x weekly - 1 sets - 10 reps - 3 hold - Supine Shoulder Flexion Extension AAROM with Dowel  - 2 x daily - 7 x weekly - 1 sets - 10 reps - 3 hold - Supine Shoulder Circles  - 2 x daily - 7 x weekly - 1 sets - 10 reps - 3 hold - Supine Shoulder External Rotation Stretch  - 2 x daily - 7 x weekly - 1 sets - 10 reps - 3 hold  ASSESSMENT:  CLINICAL IMPRESSION: Patient is a 72  y.o. female who participated  today in skilled physical therapy treatment for right proximal humerus fracture, not surgically repaired. She has progressed increasing her R shoulder AROM in all directions.She saw Dr. Karie Schwalbe and he has scheduled an MRI for tomorrow.   Some end range pain with PROM.   OBJECTIVE IMPAIRMENTS: decreased activity tolerance, decreased coordination, decreased endurance, decreased ROM, decreased strength, increased edema, increased fascial restrictions, impaired perceived functional ability, increased muscle spasms, impaired flexibility, impaired UE functional use, improper body mechanics, postural dysfunction, and pain.   REHAB POTENTIAL: Good  CLINICAL DECISION MAKING: Evolving/moderate complexity  EVALUATION COMPLEXITY: Low   GOALS: Goals reviewed with patient? Yes  SHORT TERM GOALS: Target date: 10/18/23  Independent with initial HEP Goal status: met 10/22/23  LONG TERM GOALS: Target date: 01/03/24  Independent with advanced HEP Goal status: INITIAL  2.  Dress without difficulty Goal status: Progressing  11/06/23  3.  Reach into cabinet without pain Goal status: progressing 11/10/23  4.  Increase AROM of the right shoulder to 130 degrees Goal status: progressing 11/06/23  5.  Wash hair without help Goal status:  progressing 11/10/23  6.  Increase ER to 65 degrees actively Goal status: Progressing 11/06/23  PLAN:  PT FREQUENCY: 1-2x/week  PT DURATION: 12 weeks  PLANNED INTERVENTIONS: 97164- PT Re-evaluation, 97110-Therapeutic exercises, 97530- Therapeutic activity, 97112- Neuromuscular re-education, 97535- Self Care, 56213- Manual therapy, 97014- Electrical stimulation (unattended), 97016- Vasopneumatic device, Patient/Family education, Taping, Dry Needling, Cryotherapy, and Moist heat  PLAN FOR NEXT SESSION: continue to work on functional ROM and strength   Rhaelyn Giron W, PT 11/10/2023, 8:09 AM

## 2023-11-11 ENCOUNTER — Other Ambulatory Visit: Payer: Medicare Other

## 2023-11-11 DIAGNOSIS — W19XXXD Unspecified fall, subsequent encounter: Secondary | ICD-10-CM

## 2023-11-11 DIAGNOSIS — S42291D Other displaced fracture of upper end of right humerus, subsequent encounter for fracture with routine healing: Secondary | ICD-10-CM | POA: Diagnosis not present

## 2023-11-13 ENCOUNTER — Encounter: Payer: Self-pay | Admitting: Physical Therapy

## 2023-11-13 ENCOUNTER — Ambulatory Visit: Payer: Medicare Other | Admitting: Physical Therapy

## 2023-11-13 DIAGNOSIS — M25511 Pain in right shoulder: Secondary | ICD-10-CM | POA: Diagnosis not present

## 2023-11-13 DIAGNOSIS — M25611 Stiffness of right shoulder, not elsewhere classified: Secondary | ICD-10-CM

## 2023-11-13 DIAGNOSIS — M6281 Muscle weakness (generalized): Secondary | ICD-10-CM

## 2023-11-13 NOTE — Therapy (Signed)
OUTPATIENT PHYSICAL THERAPY SHOULDER TREATMENT    Patient Name: Melissa Mcdowell MRN: [id number] DOB:1951-02-09, 72 y.o., female Today's Date: 11/13/2023  END OF SESSION:  PT End of Session - 11/13/23 0758     Visit Number 11    Date for PT Re-Evaluation 12/29/23    Authorization Type [employer] [facility] 10/16    PT Start Time 0757    PT Stop Time 0842    PT Time Calculation (min) 45 min    Activity Tolerance Patient tolerated treatment well    Behavior During Therapy WFL for tasks assessed/performed                Past Medical History:  Diagnosis Date   Acid reflux    occasional   Anemia    no current med.   Arthritis    lower back, fingers   Complication of anesthesia    states has a small mouth   Dental crowns present    Headache(784.0)    sinus - 3-4 x/month   Hypercholesteremia    Mucous cyst of finger 06/18/2012   left long finger   Past Surgical History:  Procedure Laterality Date   ABDOMINAL HYSTERECTOMY     complete   APPENDECTOMY     CATARACT EXTRACTION Bilateral    CESAREAN SECTION     ECTOPIC PREGNANCY SURGERY     MASS EXCISION  07/10/2012   Procedure: MINOR EXCISION OF MASS;  Surgeon: Wyn Forster., MD;  Location: [facility];  Service: Orthopedics;  Laterality: Left;   OOPHORECTOMY     REPAIR OF COMPLEX TRACTION RETINAL DETACHMENT Left 12/23/2018   Procedure: REPAIR OF  RETINAL DETACHMENT LEFT EYE;  Surgeon: Carmela Rima, MD;  Location: [facility] OR;  Service: Ophthalmology;  Laterality: Left;   TOTAL HIP ARTHROPLASTY Right 08/12/2023   Procedure: TOTAL HIP ARTHROPLASTY ANTERIOR APPROACH;  Surgeon: Durene Romans, MD;  Location: [facility];  Service: Orthopedics;  Laterality: Right;   Patient Active Problem List   Diagnosis Date Noted   Osteopenia 11/07/2023   Low hemoglobin 08/20/2023   Post-menopausal 08/20/2023   S/P total right hip arthroplasty 08/12/2023   Closed right hip fracture, initial encounter (HCC) 08/11/2023   Closed  fracture of proximal end of right humerus 08/11/2023   Hyperglycemia 08/11/2023   Hypercalcemia 08/11/2023   Primary insomnia 05/26/2023   Elevated blood pressure reading 05/26/2023    PCP: Tamera Punt, MD  REFERRING PROVIDER: Benjamin Stain, MD  REFERRING DIAG: right proximal humerus fracture  THERAPY DIAG:  Acute pain of right shoulder  Stiffness of right shoulder, not elsewhere classified  Muscle weakness (generalized)  Rationale for Evaluation and Treatment: Rehabilitation  ONSET DATE: 08/11/23  SUBJECTIVE:  SUBJECTIVE STATEMENT: Had an MRI, it has not been read by radiologist yet.  No pain right now.    Hand dominance: Right  PERTINENT HISTORY: Right THA  PAIN:  Are you having pain? Yes: NPRS scale: 0/10 Pain location: right shoulder Pain description: sharp, jabs Aggravating factors: worse int eh PM, activity, reaching, pain up to 8/10 Relieving factors: sling, ibuprofen, pain medication pain can be a 2/10  PRECAUTIONS: None  RED FLAGS: None   WEIGHT BEARING RESTRICTIONS: No  FALLS:  Has patient fallen in last 6 months? Yes. Number of falls 1  LIVING ENVIRONMENT: Lives with: lives with their family Lives in: House/apartment Stairs: Yes: Internal: 10 steps; on right going up Has following equipment at home: None  OCCUPATION: retired  PLOF: Independent and does housework, some yardwork  PATIENT GOALS:less pain, better ROM, do hair and get dressed easier  NEXT MD VISIT:   OBJECTIVE:  Note: Objective measures were completed at Evaluation unless otherwise noted.  DIAGNOSTIC FINDINGS:  Healing right proximal humerus fracture  PATIENT SURVEYS:  FOTO 22  COGNITION: Overall cognitive status: Within functional limits for tasks assessed     SENSATION: WFL  POSTURE: Rounded  shoulders fwd head  UPPER EXTREMITY ROM: all AROM and PROM cause pain!  Active ROM Right AROM eval Right PROM eval Right  AROM 10/22/23 Right AROM 11/06/23  Shoulder flexion 50 86 90 107  Shoulder extension      Shoulder abduction 40 50 70 90  Shoulder adduction      Shoulder internal rotation 25 30 50 60  Shoulder external rotation 45 50 55 69  Elbow flexion      Elbow extension      Wrist flexion      Wrist extension      Wrist ulnar deviation      Wrist radial deviation      Wrist pronation      Wrist supination      (Blank rows = not tested)  UPPER EXTREMITY MMT:  TESTED ONLY in her Available ROM  MMT Right eval Left eval  Shoulder flexion    Shoulder extension    Shoulder abduction    Shoulder adduction    Shoulder internal rotation 3+   Shoulder external rotation 3+   Middle trapezius    Lower trapezius    Elbow flexion    Elbow extension    Wrist flexion    Wrist extension    Wrist ulnar deviation    Wrist radial deviation    Wrist pronation    Wrist supination    Grip strength (lbs)    (Blank rows = not tested)  SHOULDER SPECIAL TESTS:  Not tested due to recent fracture JOINT MOBILITY TESTING:  Not tested due to recent fracture  PALPATION:  Very tight in the upper trap and neck, very tender and tight in the right upper arm, some swelling   TODAY'S TREATMENT:  DATE:  11/13/23 Nustep Level 3 x 5 minutes 5# rows 2x10 15# lats 2x10 Wall slides, circles and side to side overhead Yellow tband ER/IR Ball vs wall press 20# triceps 5# biceps Supine 3# small chest  press 3# isometric circles 2# ER/IR 2# flexion small ROM 3# serratus  11/10/23 Nustep level 4 x 5 minutes UBE level 2 x 5 minutes Wall slides, circles and overhead side to side Wate bar extension Wate bar total 7# biceps Wte bar overhead press  with a little assist 5# chest press  15# lats Yellow tband ER/IR PROM in sitting Red tband row and extension Physioball chest pass  11/06/23 NuStep L4 x 4 min UBE L1.5 x2 min each  Wall Wash RUE Flex & abd x10 Seated Rows 10lb & Lats 15lb 2x10 Shoulder Flex & Abd 1lb 2x10 R shoulder PROM w/ end range holds   Gh jt modes grades 2-3  11/03/23 UBE level 2 x 3 minutes Each  Wall ladder x5 flex and abd Towel stretch 5x5''  Ball circles around waist with 2# ball x10 each way  RUE IR/ER yellow 2x10 Shoulder abd 1lb 2x10 R shoulder PROM w/ end range holds   Gh jt modes grades 2-3  10/29/23 UBE L2 x27mins  Red band rows and ext 2x10  Wall ladder x5 Ball circles around waist with 2# ball x10 each way  R shoulder PROM w/ end range holds, Gh jt modes grades 2-3 Chest press with 3# WaTe 2x10 SA punches 2# 2x10  Sidelying abduction 2# 2x10 Sidelying ER 2# 2x10   10/27/23 NuStep L5 x 6 min AAROM flex, Ext, IR 1lb WaTE x10  AAROM RUE abduction Dowel x10 Wall wash flex, Abd CW/CCW x 10 with pillow wcase  Triceps Ext 10lb 2x10 RUE IR/ER yellow 2x10 R shoulder PROM w/ end range holds   Gh jt modes grades 1-2  10/24/23 UBE level 1 x 3 minutes Each  AAROM flex, Ext, IR 1lb WaTE x10  AAROM RUE abduction Dowel x10 Red tband rows Red tband extension Tricep Ext RUE green 2x10 R shoulder PROM w/ end range holds   Gh jt modes grades 1-2  10/22/23 UE ranger motions UBE level 1 x 4 minutes Red tband rows Red tband extension 3# wate bar extension and bicep curls, did some IR up behind back Wall slides and circles AROM see above Supine chest press, flexion Supine 1# ER/IR Supine 1# serratus push Supine 1# isometric circles PROM in supine  PATIENT EDUCATION: Education details: POC/HEP Person educated: Patient and Spouse Education method: Programmer, multimedia, Facilities manager, Verbal cues, and Handouts Education comprehension: verbalized understanding  HOME EXERCISE PROGRAM: Access  Code: [email or web address] URL: [email or web address] Date: 10/03/2023 Prepared by: Stacie Glaze  Exercises - Seated Shoulder Flexion Towel Slide at Table Top  - 2 x daily - 7 x weekly - 1 sets - 10 reps - 5 hold - Seated Elbow Flexion Shoulder Internal Rotation AAROM at Table with Towel  - 2 x daily - 7 x weekly - 1 sets - 10 reps - 3 hold - Seated Shoulder Shrugs  - 2 x daily - 7 x weekly - 1 sets - 10 reps - 3 hold - Supine Shoulder Press with Dowel  - 2 x daily - 7 x weekly - 1 sets - 10 reps - 3 hold - Supine Shoulder Flexion Extension AAROM with Dowel  - 2 x daily - 7 x weekly - 1 sets - 10 reps - 3 hold - Supine Shoulder Circles  -  2 x daily - 7 x weekly - 1 sets - 10 reps - 3 hold - Supine Shoulder External Rotation Stretch  - 2 x daily - 7 x weekly - 1 sets - 10 reps - 3 hold  ASSESSMENT:  CLINICAL IMPRESSION: Patient is a 72 y.o. female who participated  today in skilled physical therapy treatment for right proximal humerus fracture, not surgically repaired. She has progressed increasing her R shoulder AROM in all directions.Had MRI, the images are in but it has not been read by radiologist, continue to strengthen around the shoulder  OBJECTIVE IMPAIRMENTS: decreased activity tolerance, decreased coordination, decreased endurance, decreased ROM, decreased strength, increased edema, increased fascial restrictions, impaired perceived functional ability, increased muscle spasms, impaired flexibility, impaired UE functional use, improper body mechanics, postural dysfunction, and pain.   REHAB POTENTIAL: Good  CLINICAL DECISION MAKING: Evolving/moderate complexity  EVALUATION COMPLEXITY: Low   GOALS: Goals reviewed with patient? Yes  SHORT TERM GOALS: Target date: 10/18/23  Independent with initial HEP Goal status: met 10/22/23  LONG TERM GOALS: Target date: 01/03/24  Independent with advanced HEP Goal status: INITIAL  2.  Dress without difficulty Goal status:  Progressing  11/06/23  3.  Reach into cabinet without pain Goal status: progressing 11/10/23  4.  Increase AROM of the right shoulder to 130 degrees Goal status: progressing 11/06/23  5.  Wash hair without help Goal status: progressing 11/10/23  6.  Increase ER to 65 degrees actively Goal status: Progressing 11/06/23  PLAN:  PT FREQUENCY: 1-2x/week  PT DURATION: 12 weeks  PLANNED INTERVENTIONS: 97164- PT Re-evaluation, 97110-Therapeutic exercises, 97530- Therapeutic activity, 97112- Neuromuscular re-education, 97535- Self Care, 41660- Manual therapy, 97014- Electrical stimulation (unattended), 97016- Vasopneumatic device, Patient/Family education, Taping, Dry Needling, Cryotherapy, and Moist heat  PLAN FOR NEXT SESSION: continue to work on functional ROM and strength   Harper Vandervoort W, PT 11/13/2023, 7:59 AM

## 2023-11-18 ENCOUNTER — Ambulatory Visit: Payer: Medicare Other | Admitting: Physical Therapy

## 2023-11-18 ENCOUNTER — Encounter: Payer: Self-pay | Admitting: Physical Therapy

## 2023-11-18 DIAGNOSIS — M25611 Stiffness of right shoulder, not elsewhere classified: Secondary | ICD-10-CM

## 2023-11-18 DIAGNOSIS — M25511 Pain in right shoulder: Secondary | ICD-10-CM | POA: Diagnosis not present

## 2023-11-18 DIAGNOSIS — M6281 Muscle weakness (generalized): Secondary | ICD-10-CM

## 2023-11-18 NOTE — Therapy (Signed)
 OUTPATIENT PHYSICAL THERAPY SHOULDER TREATMENT    Patient Name: Melissa Mcdowell MRN: [id number] DOB:[birth year], 72 y.o., female Today's Date: 11/18/2023  END OF SESSION:  PT End of Session - 11/18/23 0840     Visit Number 12    Date for PT Re-Evaluation 12/29/23    PT Start Time 0840    PT Stop Time 0920    PT Time Calculation (min) 40 min    Activity Tolerance Patient tolerated treatment well    Behavior During Therapy WFL for tasks assessed/performed                Past Medical History:  Diagnosis Date   Acid reflux    occasional   Anemia    no current med.   Arthritis    lower back, fingers   Complication of anesthesia    states has a small mouth   Dental crowns present    Headache(784.0)    sinus - 3-4 x/month   Hypercholesteremia    Mucous cyst of finger 06/18/2012   left long finger   Past Surgical History:  Procedure Laterality Date   ABDOMINAL HYSTERECTOMY     complete   APPENDECTOMY     CATARACT EXTRACTION Bilateral    CESAREAN SECTION     ECTOPIC PREGNANCY SURGERY     MASS EXCISION  07/10/2012   Procedure: MINOR EXCISION OF MASS;  Surgeon: Lamar LULLA Leonor Mickey., MD;  Location: [facility];  Service: Orthopedics;  Laterality: Left;   OOPHORECTOMY     REPAIR OF COMPLEX TRACTION RETINAL DETACHMENT Left 12/23/2018   Procedure: REPAIR OF  RETINAL DETACHMENT LEFT EYE;  Surgeon: Tobie Baptist, MD;  Location: [facility] OR;  Service: Ophthalmology;  Laterality: Left;   TOTAL HIP ARTHROPLASTY Right 08/12/2023   Procedure: TOTAL HIP ARTHROPLASTY ANTERIOR APPROACH;  Surgeon: Ernie Cough, MD;  Location: [facility];  Service: Orthopedics;  Laterality: Right;   Patient Active Problem List   Diagnosis Date Noted   Osteopenia 11/07/2023   Low hemoglobin 08/20/2023   Post-menopausal 08/20/2023   S/P total right hip arthroplasty 08/12/2023   Closed right hip fracture, initial encounter (HCC) 08/11/2023   Closed fracture of proximal end of right  humerus 08/11/2023   Hyperglycemia 08/11/2023   Hypercalcemia 08/11/2023   Primary insomnia 05/26/2023   Elevated blood pressure reading 05/26/2023    PCP: Bevin, MD  REFERRING PROVIDER: Curtis, MD  REFERRING DIAG: right proximal humerus fracture  THERAPY DIAG:  Acute pain of right shoulder  Stiffness of right shoulder, not elsewhere classified  Muscle weakness (generalized)  Rationale for Evaluation and Treatment: Rehabilitation  ONSET DATE: 08/11/23  SUBJECTIVE:  SUBJECTIVE STATEMENT: Waiting on MRI results, feeling ok today, No ibuprofen    Hand dominance: Right  PERTINENT HISTORY: Right THA  PAIN:  Are you having pain? Yes: NPRS scale: .5/10 Pain location: right shoulder Pain description: sharp, jabs Aggravating factors: worse int eh PM, activity, reaching, pain up to 8/10 Relieving factors: sling, ibuprofen , pain medication pain can be a 2/10  PRECAUTIONS: None  RED FLAGS: None   WEIGHT BEARING RESTRICTIONS: No  FALLS:  Has patient fallen in last 6 months? Yes. Number of falls 1  LIVING ENVIRONMENT: Lives with: lives with their family Lives in: House/apartment Stairs: Yes: Internal: 10 steps; on right going up Has following equipment at home: None  OCCUPATION: retired  PLOF: Independent and does housework, some yardwork  PATIENT GOALS:less pain, better ROM, do hair and get dressed easier  NEXT MD VISIT:   OBJECTIVE:  Note: Objective measures were completed at Evaluation unless otherwise noted.  DIAGNOSTIC FINDINGS:  Healing right proximal humerus fracture  PATIENT SURVEYS:  FOTO 22  COGNITION: Overall cognitive status: Within functional limits for tasks assessed     SENSATION: WFL  POSTURE: Rounded shoulders fwd head  UPPER EXTREMITY ROM: all AROM  and PROM cause pain!  Active ROM Right AROM eval Right PROM eval Right  AROM 10/22/23 Right AROM 11/06/23  Shoulder flexion 50 86 90 107  Shoulder extension      Shoulder abduction 40 50 70 90  Shoulder adduction      Shoulder internal rotation 25 30 50 60  Shoulder external rotation 45 50 55 69  Elbow flexion      Elbow extension      Wrist flexion      Wrist extension      Wrist ulnar deviation      Wrist radial deviation      Wrist pronation      Wrist supination      (Blank rows = not tested)  UPPER EXTREMITY MMT:  TESTED ONLY in her Available ROM  MMT Right eval Left eval  Shoulder flexion    Shoulder extension    Shoulder abduction    Shoulder adduction    Shoulder internal rotation 3+   Shoulder external rotation 3+   Middle trapezius    Lower trapezius    Elbow flexion    Elbow extension    Wrist flexion    Wrist extension    Wrist ulnar deviation    Wrist radial deviation    Wrist pronation    Wrist supination    Grip strength (lbs)    (Blank rows = not tested)  SHOULDER SPECIAL TESTS:  Not tested due to recent fracture JOINT MOBILITY TESTING:  Not tested due to recent fracture  PALPATION:  Very tight in the upper trap and neck, very tender and tight in the right upper arm, some swelling   TODAY'S TREATMENT:  DATE:  11/18/23 UBE L1 X 3 min each Wall slides flex, CW/CCW, Abd  10# rows 2x10 15# lats 2x10 Yellow tband ER/IR RUE 2x12 20# triceps Shoulder Flex 2lb 2x10 RUE PROM in all directions with eng range holds  11/13/23 Nustep Level 3 x 5 minutes 5# rows 2x10 15# lats 2x10 Wall slides, circles and side to side overhead Yellow tband ER/IR Ball vs wall press 20# triceps 5# biceps Supine 3# small chest  press 3# isometric circles 2# ER/IR 2# flexion small ROM 3# serratus  11/10/23 Nustep level 4  x 5 minutes UBE level 2 x 5 minutes Wall slides, circles and overhead side to side Wate bar extension Wate bar total 7# biceps Wte bar overhead press with a little assist 5# chest press  15# lats Yellow tband ER/IR PROM in sitting Red tband row and extension Physioball chest pass  11/06/23 NuStep L4 x 4 min UBE L1.5 x2 min each  Wall Wash RUE Flex & abd x10 Seated Rows 10lb & Lats 15lb 2x10 Shoulder Flex & Abd 1lb 2x10 R shoulder PROM w/ end range holds   Gh jt modes grades 2-3  11/03/23 UBE level 2 x 3 minutes Each  Wall ladder x5 flex and abd Towel stretch 5x5''  Ball circles around waist with 2# ball x10 each way  RUE IR/ER yellow 2x10 Shoulder abd 1lb 2x10 R shoulder PROM w/ end range holds   Gh jt modes grades 2-3  10/29/23 UBE L2 x41mins  Red band rows and ext 2x10  Wall ladder x5 Ball circles around waist with 2# ball x10 each way  R shoulder PROM w/ end range holds, Gh jt modes grades 2-3 Chest press with 3# WaTe 2x10 SA punches 2# 2x10  Sidelying abduction 2# 2x10 Sidelying ER 2# 2x10   10/27/23 NuStep L5 x 6 min AAROM flex, Ext, IR 1lb WaTE x10  AAROM RUE abduction Dowel x10 Wall wash flex, Abd CW/CCW x 10 with pillow wcase  Triceps Ext 10lb 2x10 RUE IR/ER yellow 2x10 R shoulder PROM w/ end range holds   Gh jt modes grades 1-2  10/24/23 UBE level 1 x 3 minutes Each  AAROM flex, Ext, IR 1lb WaTE x10  AAROM RUE abduction Dowel x10 Red tband rows Red tband extension Tricep Ext RUE green 2x10 R shoulder PROM w/ end range holds   Gh jt modes grades 1-2  10/22/23 UE ranger motions UBE level 1 x 4 minutes Red tband rows Red tband extension 3# wate bar extension and bicep curls, did some IR up behind back Wall slides and circles AROM see above Supine chest press, flexion Supine 1# ER/IR Supine 1# serratus push Supine 1# isometric circles PROM in supine  PATIENT EDUCATION: Education details: POC/HEP Person educated: Patient and  Spouse Education method: Programmer, Multimedia, Facilities Manager, Verbal cues, and Handouts Education comprehension: verbalized understanding  HOME EXERCISE PROGRAM: Access Code: [email or web address] URL: [email or web address] Date: 10/03/2023 Prepared by: Ozell Mainland  Exercises - Seated Shoulder Flexion Towel Slide at Table Top  - 2 x daily - 7 x weekly - 1 sets - 10 reps - 5 hold - Seated Elbow Flexion Shoulder Internal Rotation AAROM at Table with Towel  - 2 x daily - 7 x weekly - 1 sets - 10 reps - 3 hold - Seated Shoulder Shrugs  - 2 x daily - 7 x weekly - 1 sets - 10 reps - 3 hold - Supine Shoulder Press with Dowel  - 2 x daily - 7 x weekly -  1 sets - 10 reps - 3 hold - Supine Shoulder Flexion Extension AAROM with Dowel  - 2 x daily - 7 x weekly - 1 sets - 10 reps - 3 hold - Supine Shoulder Circles  - 2 x daily - 7 x weekly - 1 sets - 10 reps - 3 hold - Supine Shoulder External Rotation Stretch  - 2 x daily - 7 x weekly - 1 sets - 10 reps - 3 hold  ASSESSMENT:  CLINICAL IMPRESSION: Patient is a 72 y.o. female who participated  today in skilled physical therapy treatment for right proximal humerus fracture, not surgically repaired. Had MRI, the images are in but has yet been read by radiologist, continue to strengthen around the shoulder. Shoulder elevation present with shoulder flexion. Postural cues needed with seated rows.  OBJECTIVE IMPAIRMENTS: decreased activity tolerance, decreased coordination, decreased endurance, decreased ROM, decreased strength, increased edema, increased fascial restrictions, impaired perceived functional ability, increased muscle spasms, impaired flexibility, impaired UE functional use, improper body mechanics, postural dysfunction, and pain.   REHAB POTENTIAL: Good  CLINICAL DECISION MAKING: Evolving/moderate complexity  EVALUATION COMPLEXITY: Low   GOALS: Goals reviewed with patient? Yes  SHORT TERM GOALS: Target date: 10/18/23  Independent with  initial HEP Goal status: met 10/22/23  LONG TERM GOALS: Target date: 01/03/24  Independent with advanced HEP Goal status: INITIAL  2.  Dress without difficulty Goal status: Progressing  11/06/23  3.  Reach into cabinet without pain Goal status: progressing 11/10/23  4.  Increase AROM of the right shoulder to 130 degrees Goal status: progressing 11/06/23  5.  Wash hair without help Goal status: progressing 11/10/23  6.  Increase ER to 65 degrees actively Goal status: Progressing 11/06/23  PLAN:  PT FREQUENCY: 1-2x/week  PT DURATION: 12 weeks  PLANNED INTERVENTIONS: 97164- PT Re-evaluation, 97110-Therapeutic exercises, 97530- Therapeutic activity, 97112- Neuromuscular re-education, 97535- Self Care, [id number]- Manual therapy, 97014- Electrical stimulation (unattended), 97016- Vasopneumatic device, Patient/Family education, Taping, Dry Needling, Cryotherapy, and Moist heat  PLAN FOR NEXT SESSION: continue to work on functional ROM and strength   Tanda KANDICE Sorrow, PTA 11/18/2023, 8:41 AM

## 2023-11-20 ENCOUNTER — Ambulatory Visit: Payer: Medicare Other | Attending: Sports Medicine | Admitting: Physical Therapy

## 2023-11-20 ENCOUNTER — Encounter: Payer: Self-pay | Admitting: Physical Therapy

## 2023-11-20 DIAGNOSIS — M25611 Stiffness of right shoulder, not elsewhere classified: Secondary | ICD-10-CM | POA: Diagnosis present

## 2023-11-20 DIAGNOSIS — M6281 Muscle weakness (generalized): Secondary | ICD-10-CM | POA: Diagnosis present

## 2023-11-20 DIAGNOSIS — M25511 Pain in right shoulder: Secondary | ICD-10-CM | POA: Insufficient documentation

## 2023-11-20 NOTE — Therapy (Signed)
 OUTPATIENT PHYSICAL THERAPY SHOULDER TREATMENT    Patient Name: Melissa Mcdowell MRN: [id number] DOB:1951/08/03, 73 y.o., female Today's Date: 11/20/2023  END OF SESSION:  PT End of Session - 11/20/23 0845     Visit Number 13    Date for PT Re-Evaluation 12/29/23    PT Start Time 0845    PT Stop Time 0930    PT Time Calculation (min) 45 min    Activity Tolerance Patient tolerated treatment well    Behavior During Therapy WFL for tasks assessed/performed                Past Medical History:  Diagnosis Date   Acid reflux    occasional   Anemia    no current med.   Arthritis    lower back, fingers   Complication of anesthesia    states has a small mouth   Dental crowns present    Headache(784.0)    sinus - 3-4 x/month   Hypercholesteremia    Mucous cyst of finger 06/18/2012   left long finger   Past Surgical History:  Procedure Laterality Date   ABDOMINAL HYSTERECTOMY     complete   APPENDECTOMY     CATARACT EXTRACTION Bilateral    CESAREAN SECTION     ECTOPIC PREGNANCY SURGERY     MASS EXCISION  07/10/2012   Procedure: MINOR EXCISION OF MASS;  Surgeon: Lamar LULLA Leonor Mickey., MD;  Location: [facility];  Service: Orthopedics;  Laterality: Left;   OOPHORECTOMY     REPAIR OF COMPLEX TRACTION RETINAL DETACHMENT Left 12/23/2018   Procedure: REPAIR OF  RETINAL DETACHMENT LEFT EYE;  Surgeon: Tobie Baptist, MD;  Location: [facility] OR;  Service: Ophthalmology;  Laterality: Left;   TOTAL HIP ARTHROPLASTY Right 08/12/2023   Procedure: TOTAL HIP ARTHROPLASTY ANTERIOR APPROACH;  Surgeon: Ernie Cough, MD;  Location: [facility];  Service: Orthopedics;  Laterality: Right;   Patient Active Problem List   Diagnosis Date Noted   Osteopenia 11/07/2023   Low hemoglobin 08/20/2023   Post-menopausal 08/20/2023   S/P total right hip arthroplasty 08/12/2023   Closed right hip fracture, initial encounter (HCC) 08/11/2023   Closed fracture of proximal end of right humerus  08/11/2023   Hyperglycemia 08/11/2023   Hypercalcemia 08/11/2023   Primary insomnia 05/26/2023   Elevated blood pressure reading 05/26/2023    PCP: Bevin, MD  REFERRING PROVIDER: Curtis, MD  REFERRING DIAG: right proximal humerus fracture  THERAPY DIAG:  Acute pain of right shoulder  Stiffness of right shoulder, not elsewhere classified  Muscle weakness (generalized)  Rationale for Evaluation and Treatment: Rehabilitation  ONSET DATE: 08/11/23  SUBJECTIVE:  SUBJECTIVE STATEMENT: Waiting on MRI results, Im ok  Hand dominance: Right  PERTINENT HISTORY: Right THA  PAIN:  Are you having pain? Yes: NPRS scale: 0/10 Pain location: right shoulder Pain description: sharp, jabs Aggravating factors: worse int eh PM, activity, reaching, pain up to 8/10 Relieving factors: sling, ibuprofen , pain medication pain can be a 2/10  PRECAUTIONS: None  RED FLAGS: None   WEIGHT BEARING RESTRICTIONS: No  FALLS:  Has patient fallen in last 6 months? Yes. Number of falls 1  LIVING ENVIRONMENT: Lives with: lives with their family Lives in: House/apartment Stairs: Yes: Internal: 10 steps; on right going up Has following equipment at home: None  OCCUPATION: retired  PLOF: Independent and does housework, some yardwork  PATIENT GOALS:less pain, better ROM, do hair and get dressed easier  NEXT MD VISIT:   OBJECTIVE:  Note: Objective measures were completed at Evaluation unless otherwise noted.  DIAGNOSTIC FINDINGS:  Healing right proximal humerus fracture  PATIENT SURVEYS:  FOTO 22  COGNITION: Overall cognitive status: Within functional limits for tasks assessed     SENSATION: WFL  POSTURE: Rounded shoulders fwd head  UPPER EXTREMITY ROM: all AROM and PROM cause pain!  Active ROM  Right AROM eval Right PROM eval Right  AROM 10/22/23 Right AROM 11/06/23 Right AROM 11/20/23  Shoulder flexion 50 86 90 107 127  Shoulder extension       Shoulder abduction 40 50 70 90 98  Shoulder adduction       Shoulder internal rotation 25 30 50 60 59  Shoulder external rotation 45 50 55 69 70  Elbow flexion       Elbow extension       Wrist flexion       Wrist extension       Wrist ulnar deviation       Wrist radial deviation       Wrist pronation       Wrist supination       (Blank rows = not tested)  UPPER EXTREMITY MMT:  TESTED ONLY in her Available ROM  MMT Right eval Left eval  Shoulder flexion    Shoulder extension    Shoulder abduction    Shoulder adduction    Shoulder internal rotation 3+   Shoulder external rotation 3+   Middle trapezius    Lower trapezius    Elbow flexion    Elbow extension    Wrist flexion    Wrist extension    Wrist ulnar deviation    Wrist radial deviation    Wrist pronation    Wrist supination    Grip strength (lbs)    (Blank rows = not tested)  SHOULDER SPECIAL TESTS:  Not tested due to recent fracture JOINT MOBILITY TESTING:  Not tested due to recent fracture  PALPATION:  Very tight in the upper trap and neck, very tender and tight in the right upper arm, some swelling   TODAY'S TREATMENT:  DATE:  11/20/23 AAROM flex, Ext, IR 2lb WaTE x10  Around the back ball passes red x 10 each  UBE L1 X 2 min each Rows & Lats 15lb 2x10 Shoulder Ext red 2x10 RUE ER/IR yellow 2x10 Wall slides flex, CW/CCW, Abd    11/18/23 UBE L1 X 3 min each Wall slides flex, CW/CCW, Abd  10# rows 2x10 15# lats 2x10 Yellow tband ER/IR RUE 2x12 20# triceps Shoulder Flex 2lb 2x10 RUE PROM in all directions with eng range holds  11/13/23 Nustep Level 3 x 5 minutes 5# rows 2x10 15# lats 2x10 Wall slides,  circles and side to side overhead Yellow tband ER/IR Ball vs wall press 20# triceps 5# biceps Supine 3# small chest  press 3# isometric circles 2# ER/IR 2# flexion small ROM 3# serratus  11/10/23 Nustep level 4 x 5 minutes UBE level 2 x 5 minutes Wall slides, circles and overhead side to side Wate bar extension Wate bar total 7# biceps Wte bar overhead press with a little assist 5# chest press  15# lats Yellow tband ER/IR PROM in sitting Red tband row and extension Physioball chest pass  11/06/23 NuStep L4 x 4 min UBE L1.5 x2 min each  Wall Wash RUE Flex & abd x10 Seated Rows 10lb & Lats 15lb 2x10 Shoulder Flex & Abd 1lb 2x10 R shoulder PROM w/ end range holds   Gh jt modes grades 2-3  11/03/23 UBE level 2 x 3 minutes Each  Wall ladder x5 flex and abd Towel stretch 5x5''  Ball circles around waist with 2# ball x10 each way  RUE IR/ER yellow 2x10 Shoulder abd 1lb 2x10 R shoulder PROM w/ end range holds   Gh jt modes grades 2-3  10/29/23 UBE L2 x40mins  Red band rows and ext 2x10  Wall ladder x5 Ball circles around waist with 2# ball x10 each way  R shoulder PROM w/ end range holds, Gh jt modes grades 2-3 Chest press with 3# WaTe 2x10 SA punches 2# 2x10  Sidelying abduction 2# 2x10 Sidelying ER 2# 2x10   10/27/23 NuStep L5 x 6 min AAROM flex, Ext, IR 1lb WaTE x10  AAROM RUE abduction Dowel x10 Wall wash flex, Abd CW/CCW x 10 with pillow wcase  Triceps Ext 10lb 2x10 RUE IR/ER yellow 2x10 R shoulder PROM w/ end range holds   Gh jt modes grades 1-2  10/24/23 UBE level 1 x 3 minutes Each  AAROM flex, Ext, IR 1lb WaTE x10  AAROM RUE abduction Dowel x10 Red tband rows Red tband extension Tricep Ext RUE green 2x10 R shoulder PROM w/ end range holds   Gh jt modes grades 1-2  10/22/23 UE ranger motions UBE level 1 x 4 minutes Red tband rows Red tband extension 3# wate bar extension and bicep curls, did some IR up behind back Wall slides and  circles AROM see above Supine chest press, flexion Supine 1# ER/IR Supine 1# serratus push Supine 1# isometric circles PROM in supine  PATIENT EDUCATION: Education details: POC/HEP Person educated: Patient and Spouse Education method: Programmer, Multimedia, Facilities Manager, Verbal cues, and Handouts Education comprehension: verbalized understanding  HOME EXERCISE PROGRAM: Access Code: [email or web address] URL: [email or web address] Date: 10/03/2023 Prepared by: Ozell Mainland  Exercises - Seated Shoulder Flexion Towel Slide at Table Top  - 2 x daily - 7 x weekly - 1 sets - 10 reps - 5 hold - Seated Elbow Flexion Shoulder Internal Rotation AAROM at Table with Towel  - 2 x daily - 7 x weekly -  1 sets - 10 reps - 3 hold - Seated Shoulder Shrugs  - 2 x daily - 7 x weekly - 1 sets - 10 reps - 3 hold - Supine Shoulder Press with Dowel  - 2 x daily - 7 x weekly - 1 sets - 10 reps - 3 hold - Supine Shoulder Flexion Extension AAROM with Dowel  - 2 x daily - 7 x weekly - 1 sets - 10 reps - 3 hold - Supine Shoulder Circles  - 2 x daily - 7 x weekly - 1 sets - 10 reps - 3 hold - Supine Shoulder External Rotation Stretch  - 2 x daily - 7 x weekly - 1 sets - 10 reps - 3 hold  ASSESSMENT:  CLINICAL IMPRESSION: Patient is a 73 y.o. female who participated  today in skilled physical therapy treatment for right proximal humerus fracture, not surgically repaired. Had MRI, the images are in but has yet been read by radiologist, continue to strengthen around the shoulder. Slight improvement with RUE AROM. Shoulder elevation present with shoulder flexion. Increase fatigue today with interventions. Postural cues needed with seated rows.  OBJECTIVE IMPAIRMENTS: decreased activity tolerance, decreased coordination, decreased endurance, decreased ROM, decreased strength, increased edema, increased fascial restrictions, impaired perceived functional ability, increased muscle spasms, impaired flexibility, impaired UE  functional use, improper body mechanics, postural dysfunction, and pain.   REHAB POTENTIAL: Good  CLINICAL DECISION MAKING: Evolving/moderate complexity  EVALUATION COMPLEXITY: Low   GOALS: Goals reviewed with patient? Yes  SHORT TERM GOALS: Target date: 10/18/23  Independent with initial HEP Goal status: met 10/22/23  LONG TERM GOALS: Target date: 01/03/24  Independent with advanced HEP Goal status: INITIAL  2.  Dress without difficulty Goal status: Progressing  11/06/23  3.  Reach into cabinet without pain Goal status: progressing 11/10/23  4.  Increase AROM of the right shoulder to 130 degrees Goal status: progressing 11/06/23  5.  Wash hair without help Goal status: progressing 11/10/23  6.  Increase ER to 65 degrees actively Goal status: Progressing 11/06/23  PLAN:  PT FREQUENCY: 1-2x/week  PT DURATION: 12 weeks  PLANNED INTERVENTIONS: 97164- PT Re-evaluation, 97110-Therapeutic exercises, 97530- Therapeutic activity, 97112- Neuromuscular re-education, 97535- Self Care, 02859- Manual therapy, 97014- Electrical stimulation (unattended), 97016- Vasopneumatic device, Patient/Family education, Taping, Dry Needling, Cryotherapy, and Moist heat  PLAN FOR NEXT SESSION: continue to work on functional ROM and strength   Tanda KANDICE Sorrow, PTA 11/20/2023, 8:47 AM

## 2023-11-24 ENCOUNTER — Encounter: Payer: Self-pay | Admitting: Sports Medicine

## 2023-11-24 ENCOUNTER — Encounter: Payer: Self-pay | Admitting: Physical Therapy

## 2023-11-24 ENCOUNTER — Ambulatory Visit: Payer: Medicare Other | Admitting: Physical Therapy

## 2023-11-24 DIAGNOSIS — M25611 Stiffness of right shoulder, not elsewhere classified: Secondary | ICD-10-CM

## 2023-11-24 DIAGNOSIS — M25511 Pain in right shoulder: Secondary | ICD-10-CM | POA: Diagnosis not present

## 2023-11-24 DIAGNOSIS — M6281 Muscle weakness (generalized): Secondary | ICD-10-CM

## 2023-11-24 NOTE — Therapy (Signed)
 OUTPATIENT PHYSICAL THERAPY SHOULDER TREATMENT    Patient Name: Melissa Mcdowell MRN: [id number] DOB:1951/07/21, 73 y.o., female Today's Date: 11/24/2023  END OF SESSION:  PT End of Session - 11/24/23 0850     Visit Number 14    Date for PT Re-Evaluation 12/29/23    PT Start Time 0848    PT Stop Time 0930    PT Time Calculation (min) 42 min    Activity Tolerance Patient tolerated treatment well    Behavior During Therapy WFL for tasks assessed/performed                Past Medical History:  Diagnosis Date   Acid reflux    occasional   Anemia    no current med.   Arthritis    lower back, fingers   Complication of anesthesia    states has a small mouth   Dental crowns present    Headache(784.0)    sinus - 3-4 x/month   Hypercholesteremia    Mucous cyst of finger 06/18/2012   left long finger   Past Surgical History:  Procedure Laterality Date   ABDOMINAL HYSTERECTOMY     complete   APPENDECTOMY     CATARACT EXTRACTION Bilateral    CESAREAN SECTION     ECTOPIC PREGNANCY SURGERY     MASS EXCISION  07/10/2012   Procedure: MINOR EXCISION OF MASS;  Surgeon: Lamar LULLA Leonor Mickey., MD;  Location: [facility];  Service: Orthopedics;  Laterality: Left;   OOPHORECTOMY     REPAIR OF COMPLEX TRACTION RETINAL DETACHMENT Left 12/23/2018   Procedure: REPAIR OF  RETINAL DETACHMENT LEFT EYE;  Surgeon: Tobie Baptist, MD;  Location: [facility];  Service: Ophthalmology;  Laterality: Left;   TOTAL HIP ARTHROPLASTY Right 08/12/2023   Procedure: TOTAL HIP ARTHROPLASTY ANTERIOR APPROACH;  Surgeon: Ernie Cough, MD;  Location: [facility];  Service: Orthopedics;  Laterality: Right;   Patient Active Problem List   Diagnosis Date Noted   Osteopenia 11/07/2023   Low hemoglobin 08/20/2023   Post-menopausal 08/20/2023   S/P total right hip arthroplasty 08/12/2023   Closed right hip fracture, initial encounter (HCC) 08/11/2023   Closed fracture of proximal end of right humerus  08/11/2023   Hyperglycemia 08/11/2023   Hypercalcemia 08/11/2023   Primary insomnia 05/26/2023   Elevated blood pressure reading 05/26/2023    PCP: Bevin, MD  REFERRING PROVIDER: Curtis, MD  REFERRING DIAG: right proximal humerus fracture  THERAPY DIAG:  Acute pain of right shoulder  Stiffness of right shoulder, not elsewhere classified  Muscle weakness (generalized)  Rationale for Evaluation and Treatment: Rehabilitation  ONSET DATE: 08/11/23  SUBJECTIVE:  SUBJECTIVE STATEMENT: Still waiting on MRI results, doing ok, achy all over to the the weather and arthritis   Hand dominance: Right  PERTINENT HISTORY: Right THA  PAIN:  Are you having pain? Yes: NPRS scale: 0/10 Pain location: right shoulder Pain description: sharp, jabs Aggravating factors: worse int eh PM, activity, reaching, pain up to 8/10 Relieving factors: sling, ibuprofen , pain medication pain can be a 2/10  PRECAUTIONS: None  RED FLAGS: None   WEIGHT BEARING RESTRICTIONS: No  FALLS:  Has patient fallen in last 6 months? Yes. Number of falls 1  LIVING ENVIRONMENT: Lives with: lives with their family Lives in: House/apartment Stairs: Yes: Internal: 10 steps; on right going up Has following equipment at home: None  OCCUPATION: retired  PLOF: Independent and does housework, some yardwork  PATIENT GOALS:less pain, better ROM, do hair and get dressed easier  NEXT MD VISIT:   OBJECTIVE:  Note: Objective measures were completed at Evaluation unless otherwise noted.  DIAGNOSTIC FINDINGS:  Healing right proximal humerus fracture  PATIENT SURVEYS:  FOTO 22  COGNITION: Overall cognitive status: Within functional limits for tasks assessed     SENSATION: WFL  POSTURE: Rounded shoulders fwd head  UPPER  EXTREMITY ROM: all AROM and PROM cause pain!  Active ROM Right AROM eval Right PROM eval Right  AROM 10/22/23 Right AROM 11/06/23 Right AROM 11/20/23  Shoulder flexion 50 86 90 107 127  Shoulder extension       Shoulder abduction 40 50 70 90 98  Shoulder adduction       Shoulder internal rotation 25 30 50 60 59  Shoulder external rotation 45 50 55 69 70  Elbow flexion       Elbow extension       Wrist flexion       Wrist extension       Wrist ulnar deviation       Wrist radial deviation       Wrist pronation       Wrist supination       (Blank rows = not tested)  UPPER EXTREMITY MMT:  TESTED ONLY in her Available ROM  MMT Right eval Left eval  Shoulder flexion    Shoulder extension    Shoulder abduction    Shoulder adduction    Shoulder internal rotation 3+   Shoulder external rotation 3+   Middle trapezius    Lower trapezius    Elbow flexion    Elbow extension    Wrist flexion    Wrist extension    Wrist ulnar deviation    Wrist radial deviation    Wrist pronation    Wrist supination    Grip strength (lbs)    (Blank rows = not tested)  SHOULDER SPECIAL TESTS:  Not tested due to recent fracture JOINT MOBILITY TESTING:  Not tested due to recent fracture  PALPATION:  Very tight in the upper trap and neck, very tender and tight in the right upper arm, some swelling   TODAY'S TREATMENT:  DATE:  11/24/23 UBE L2 x 3 min each Rows & Lats 15lb 2x12 Shoulder Ext green 2x10  RUE ER/IR yellow 2x10 R shoulder PROM w/ end range holds   Gh jt modes grades 2-3  11/20/23 AAROM flex, Ext, IR 2lb WaTE x10  Around the back ball passes red x 10 each  UBE L1 X 2 min each Rows & Lats 15lb 2x10 Shoulder Ext red 2x10 RUE ER/IR yellow 2x10 Wall slides flex, CW/CCW, Abd    11/18/23 UBE L1 X 3 min each Wall slides flex, CW/CCW, Abd  10#  rows 2x10 15# lats 2x10 Yellow tband ER/IR RUE 2x12 20# triceps Shoulder Flex 2lb 2x10 RUE PROM in all directions with eng range holds  11/13/23 Nustep Level 3 x 5 minutes 5# rows 2x10 15# lats 2x10 Wall slides, circles and side to side overhead Yellow tband ER/IR Ball vs wall press 20# triceps 5# biceps Supine 3# small chest  press 3# isometric circles 2# ER/IR 2# flexion small ROM 3# serratus  11/10/23 Nustep level 4 x 5 minutes UBE level 2 x 5 minutes Wall slides, circles and overhead side to side Wate bar extension Wate bar total 7# biceps Wte bar overhead press with a little assist 5# chest press  15# lats Yellow tband ER/IR PROM in sitting Red tband row and extension Physioball chest pass  11/06/23 NuStep L4 x 4 min UBE L1.5 x2 min each  Wall Wash RUE Flex & abd x10 Seated Rows 10lb & Lats 15lb 2x10 Shoulder Flex & Abd 1lb 2x10 R shoulder PROM w/ end range holds   Gh jt modes grades 2-3  11/03/23 UBE level 2 x 3 minutes Each  Wall ladder x5 flex and abd Towel stretch 5x5''  Ball circles around waist with 2# ball x10 each way  RUE IR/ER yellow 2x10 Shoulder abd 1lb 2x10 R shoulder PROM w/ end range holds   Gh jt modes grades 2-3  10/29/23 UBE L2 x41mins  Red band rows and ext 2x10  Wall ladder x5 Ball circles around waist with 2# ball x10 each way  R shoulder PROM w/ end range holds, Gh jt modes grades 2-3 Chest press with 3# WaTe 2x10 SA punches 2# 2x10  Sidelying abduction 2# 2x10 Sidelying ER 2# 2x10   10/27/23 NuStep L5 x 6 min AAROM flex, Ext, IR 1lb WaTE x10  AAROM RUE abduction Dowel x10 Wall wash flex, Abd CW/CCW x 10 with pillow wcase  Triceps Ext 10lb 2x10 RUE IR/ER yellow 2x10 R shoulder PROM w/ end range holds   Gh jt modes grades 1-2  10/24/23 UBE level 1 x 3 minutes Each  AAROM flex, Ext, IR 1lb WaTE x10  AAROM RUE abduction Dowel x10 Red tband rows Red tband extension Tricep Ext RUE green 2x10 R shoulder PROM w/ end  range holds   Gh jt modes grades 1-2  10/22/23 UE ranger motions UBE level 1 x 4 minutes Red tband rows Red tband extension 3# wate bar extension and bicep curls, did some IR up behind back Wall slides and circles AROM see above Supine chest press, flexion Supine 1# ER/IR Supine 1# serratus push Supine 1# isometric circles PROM in supine  PATIENT EDUCATION: Education details: POC/HEP Person educated: Patient and Spouse Education method: Programmer, Multimedia, Facilities Manager, Verbal cues, and Handouts Education comprehension: verbalized understanding  HOME EXERCISE PROGRAM: Access Code: [email or web address] URL: [email or web address] Date: 10/03/2023 Prepared by: Ozell Mainland  Exercises - Seated Shoulder Flexion Towel Slide at Table Top  - 2  x daily - 7 x weekly - 1 sets - 10 reps - 5 hold - Seated Elbow Flexion Shoulder Internal Rotation AAROM at Table with Towel  - 2 x daily - 7 x weekly - 1 sets - 10 reps - 3 hold - Seated Shoulder Shrugs  - 2 x daily - 7 x weekly - 1 sets - 10 reps - 3 hold - Supine Shoulder Press with Dowel  - 2 x daily - 7 x weekly - 1 sets - 10 reps - 3 hold - Supine Shoulder Flexion Extension AAROM with Dowel  - 2 x daily - 7 x weekly - 1 sets - 10 reps - 3 hold - Supine Shoulder Circles  - 2 x daily - 7 x weekly - 1 sets - 10 reps - 3 hold - Supine Shoulder External Rotation Stretch  - 2 x daily - 7 x weekly - 1 sets - 10 reps - 3 hold  ASSESSMENT:  CLINICAL IMPRESSION: Patient is a 73 y.o. female who participated  today in skilled physical therapy treatment for right proximal humerus fracture, not surgically repaired. Had MRI, the images are in but has yet been read by radiologist, possible rotator cuff tear.  Continued to strengthen around the shoulder. Postural cues needed with seated rows. Tactiel cues so patient will not twist trunk with shoulder external rotation. Pt has good PROM, cues needed to get pt to relax and not guard.  OBJECTIVE  IMPAIRMENTS: decreased activity tolerance, decreased coordination, decreased endurance, decreased ROM, decreased strength, increased edema, increased fascial restrictions, impaired perceived functional ability, increased muscle spasms, impaired flexibility, impaired UE functional use, improper body mechanics, postural dysfunction, and pain.   REHAB POTENTIAL: Good  CLINICAL DECISION MAKING: Evolving/moderate complexity  EVALUATION COMPLEXITY: Low   GOALS: Goals reviewed with patient? Yes  SHORT TERM GOALS: Target date: 10/18/23  Independent with initial HEP Goal status: met 10/22/23  LONG TERM GOALS: Target date: 01/03/24  Independent with advanced HEP Goal status: INITIAL  2.  Dress without difficulty Goal status: Progressing  11/06/23  3.  Reach into cabinet without pain Goal status: progressing 11/10/23  4.  Increase AROM of the right shoulder to 130 degrees Goal status: progressing 11/06/23  5.  Wash hair without help Goal status: progressing 11/10/23  6.  Increase ER to 65 degrees actively Goal status: Progressing 11/06/23  PLAN:  PT FREQUENCY: 1-2x/week  PT DURATION: 12 weeks  PLANNED INTERVENTIONS: 97164- PT Re-evaluation, 97110-Therapeutic exercises, 97530- Therapeutic activity, 97112- Neuromuscular re-education, 97535- Self Care, 02859- Manual therapy, 97014- Electrical stimulation (unattended), 97016- Vasopneumatic device, Patient/Family education, Taping, Dry Needling, Cryotherapy, and Moist heat  PLAN FOR NEXT SESSION: continue to work on functional ROM and strength   Tanda KANDICE Sorrow, PTA 11/24/2023, 8:50 AM

## 2023-11-26 ENCOUNTER — Ambulatory Visit: Payer: Medicare Other | Admitting: Physical Therapy

## 2023-11-26 ENCOUNTER — Encounter: Payer: Self-pay | Admitting: Physical Therapy

## 2023-11-26 DIAGNOSIS — M25511 Pain in right shoulder: Secondary | ICD-10-CM

## 2023-11-26 DIAGNOSIS — M25611 Stiffness of right shoulder, not elsewhere classified: Secondary | ICD-10-CM

## 2023-11-26 DIAGNOSIS — M6281 Muscle weakness (generalized): Secondary | ICD-10-CM

## 2023-11-26 NOTE — Addendum Note (Signed)
 Addended by: CURTIS DEBBY PARAS on: 11/26/2023 10:09 AM   Modules accepted: Orders

## 2023-11-26 NOTE — Therapy (Signed)
 OUTPATIENT PHYSICAL THERAPY SHOULDER TREATMENT    Patient Name: Melissa Mcdowell MRN: [id number] DOB:27-Jan-1951, 73 y.o., female Today's Date: 11/26/2023  END OF SESSION:  PT End of Session - 11/26/23 0849     Visit Number 15    Date for PT Re-Evaluation 12/29/23    Authorization Type [employer] [facility] 14/16    PT Start Time 0845    PT Stop Time 0930    PT Time Calculation (min) 45 min    Activity Tolerance Patient tolerated treatment well    Behavior During Therapy WFL for tasks assessed/performed                Past Medical History:  Diagnosis Date   Acid reflux    occasional   Anemia    no current med.   Arthritis    lower back, fingers   Complication of anesthesia    states has a small mouth   Dental crowns present    Headache(784.0)    sinus - 3-4 x/month   Hypercholesteremia    Mucous cyst of finger 06/18/2012   left long finger   Past Surgical History:  Procedure Laterality Date   ABDOMINAL HYSTERECTOMY     complete   APPENDECTOMY     CATARACT EXTRACTION Bilateral    CESAREAN SECTION     ECTOPIC PREGNANCY SURGERY     MASS EXCISION  07/10/2012   Procedure: MINOR EXCISION OF MASS;  Surgeon: Lamar LULLA Leonor Mickey., MD;  Location: [facility];  Service: Orthopedics;  Laterality: Left;   OOPHORECTOMY     REPAIR OF COMPLEX TRACTION RETINAL DETACHMENT Left 12/23/2018   Procedure: REPAIR OF  RETINAL DETACHMENT LEFT EYE;  Surgeon: Tobie Baptist, MD;  Location: [facility];  Service: Ophthalmology;  Laterality: Left;   TOTAL HIP ARTHROPLASTY Right 08/12/2023   Procedure: TOTAL HIP ARTHROPLASTY ANTERIOR APPROACH;  Surgeon: Ernie Cough, MD;  Location: [facility];  Service: Orthopedics;  Laterality: Right;   Patient Active Problem List   Diagnosis Date Noted   Osteopenia 11/07/2023   Low hemoglobin 08/20/2023   Post-menopausal 08/20/2023   S/P total right hip arthroplasty 08/12/2023   Closed right hip fracture, initial encounter (HCC) 08/11/2023   Closed  fracture of proximal end of right humerus 08/11/2023   Hyperglycemia 08/11/2023   Hypercalcemia 08/11/2023   Primary insomnia 05/26/2023   Elevated blood pressure reading 05/26/2023    PCP: Bevin, MD  REFERRING PROVIDER: Curtis, MD  REFERRING DIAG: right proximal humerus fracture  THERAPY DIAG:  Acute pain of right shoulder  Stiffness of right shoulder, not elsewhere classified  Muscle weakness (generalized)  Rationale for Evaluation and Treatment: Rehabilitation  ONSET DATE: 08/11/23  SUBJECTIVE:  SUBJECTIVE STATEMENT: The results of the MRI are in, I read the radiologist impression to her and copied below.  I still get tired and I feel weak IMPRESSION: 1. Nondisplaced impacted ununited fracture of the surgical neck of the proximal humerus with surrounding bone marrow edema. If there is further clinical concern, recommend a CT of the shoulder for better characterization of the degree of healing. 2. Moderate supraspinatus tendinosis with a high-grade partial-thickness articular surface delamination type tear with a probable small full-thickness component. 3. Mild infraspinatus and subscapularis tendinosis. 4. Moderate tendinosis of the intra-articular portion of the long head of the biceps tendon. 5. Moderate osteoarthritis of the glenohumeral joint.  Hand dominance: Right  PERTINENT HISTORY: Right THA  PAIN:  Are you having pain? Yes: NPRS scale: 0/10 Pain location: right shoulder Pain description: sharp, jabs Aggravating factors: worse int eh PM, activity, reaching, pain up to 8/10 Relieving factors: sling, ibuprofen , pain medication pain can be a 2/10  PRECAUTIONS: None  RED FLAGS: None   WEIGHT BEARING RESTRICTIONS: No  FALLS:  Has patient fallen in last 6 months? Yes.  Number of falls 1  LIVING ENVIRONMENT: Lives with: lives with their family Lives in: House/apartment Stairs: Yes: Internal: 10 steps; on right going up Has following equipment at home: None  OCCUPATION: retired  PLOF: Independent and does housework, some yardwork  PATIENT GOALS:less pain, better ROM, do hair and get dressed easier  NEXT MD VISIT:   OBJECTIVE:  Note: Objective measures were completed at Evaluation unless otherwise noted.  DIAGNOSTIC FINDINGS:  Healing right proximal humerus fracture  PATIENT SURVEYS:  FOTO 22  COGNITION: Overall cognitive status: Within functional limits for tasks assessed     SENSATION: WFL  POSTURE: Rounded shoulders fwd head  UPPER EXTREMITY ROM: all AROM and PROM cause pain!  Active ROM Right AROM eval Right PROM eval Right  AROM 10/22/23 Right AROM 11/06/23 Right AROM 11/20/23  Shoulder flexion 50 86 90 107 127  Shoulder extension       Shoulder abduction 40 50 70 90 98  Shoulder adduction       Shoulder internal rotation 25 30 50 60 59  Shoulder external rotation 45 50 55 69 70  Elbow flexion       Elbow extension       Wrist flexion       Wrist extension       Wrist ulnar deviation       Wrist radial deviation       Wrist pronation       Wrist supination       (Blank rows = not tested)  UPPER EXTREMITY MMT:  TESTED ONLY in her Available ROM  MMT Right eval Left eval  Shoulder flexion    Shoulder extension    Shoulder abduction    Shoulder adduction    Shoulder internal rotation 3+   Shoulder external rotation 3+   Middle trapezius    Lower trapezius    Elbow flexion    Elbow extension    Wrist flexion    Wrist extension    Wrist ulnar deviation    Wrist radial deviation    Wrist pronation    Wrist supination    Grip strength (lbs)    (Blank rows = not tested)  SHOULDER SPECIAL TESTS:  Not tested due to recent fracture JOINT MOBILITY TESTING:  Not tested due to recent fracture  PALPATION:   Very tight in the upper trap and neck, very tender and  tight in the right upper arm, some swelling   TODAY'S TREATMENT:                                                                                                                                         DATE:  11/26/23 UBE level 3 x 5 minutes Reviewed and discussed the results of the MRI Wall slides, circles and overhead side to side Yellow tband ER/IR Back to wall overhead ball reach light and with 2# ball Supine 3# small ROM punch Supine 3# isometric circles Supine 3# serratus push Supine 2# flexion Supine PROM of the right shoulder to her tolerance  11/24/23 UBE L2 x 3 min each Rows & Lats 15lb 2x12 Shoulder Ext green 2x10  RUE ER/IR yellow 2x10 R shoulder PROM w/ end range holds   Gh jt modes grades 2-3  11/20/23 AAROM flex, Ext, IR 2lb WaTE x10  Around the back ball passes red x 10 each  UBE L1 X 2 min each Rows & Lats 15lb 2x10 Shoulder Ext red 2x10 RUE ER/IR yellow 2x10 Wall slides flex, CW/CCW, Abd    11/18/23 UBE L1 X 3 min each Wall slides flex, CW/CCW, Abd  10# rows 2x10 15# lats 2x10 Yellow tband ER/IR RUE 2x12 20# triceps Shoulder Flex 2lb 2x10 RUE PROM in all directions with eng range holds  11/13/23 Nustep Level 3 x 5 minutes 5# rows 2x10 15# lats 2x10 Wall slides, circles and side to side overhead Yellow tband ER/IR Ball vs wall press 20# triceps 5# biceps Supine 3# small chest  press 3# isometric circles 2# ER/IR 2# flexion small ROM 3# serratus  11/10/23 Nustep level 4 x 5 minutes UBE level 2 x 5 minutes Wall slides, circles and overhead side to side Wate bar extension Wate bar total 7# biceps Wte bar overhead press with a little assist 5# chest press  15# lats Yellow tband ER/IR PROM in sitting Red tband row and extension Physioball chest pass  11/06/23 NuStep L4 x 4 min UBE L1.5 x2 min each  Wall Wash RUE Flex & abd x10 Seated Rows 10lb & Lats 15lb 2x10 Shoulder Flex &  Abd 1lb 2x10 R shoulder PROM w/ end range holds   Gh jt modes grades 2-3  11/03/23 UBE level 2 x 3 minutes Each  Wall ladder x5 flex and abd Towel stretch 5x5''  Ball circles around waist with 2# ball x10 each way  RUE IR/ER yellow 2x10 Shoulder abd 1lb 2x10 R shoulder PROM w/ end range holds   Gh jt modes grades 2-3  10/29/23 UBE L2 x27mins  Red band rows and ext 2x10  Wall ladder x5 Ball circles around waist with 2# ball x10 each way  R shoulder PROM w/ end range holds, Gh jt modes grades 2-3 Chest press with 3# WaTe 2x10 SA punches 2# 2x10  Sidelying abduction 2# 2x10 Sidelying ER 2#  2x10   10/27/23 NuStep L5 x 6 min AAROM flex, Ext, IR 1lb WaTE x10  AAROM RUE abduction Dowel x10 Wall wash flex, Abd CW/CCW x 10 with pillow wcase  Triceps Ext 10lb 2x10 RUE IR/ER yellow 2x10 R shoulder PROM w/ end range holds   Gh jt modes grades 1-2  10/24/23 UBE level 1 x 3 minutes Each  AAROM flex, Ext, IR 1lb WaTE x10  AAROM RUE abduction Dowel x10 Red tband rows Red tband extension Tricep Ext RUE green 2x10 R shoulder PROM w/ end range holds   Gh jt modes grades 1-2  10/22/23 UE ranger motions UBE level 1 x 4 minutes Red tband rows Red tband extension 3# wate bar extension and bicep curls, did some IR up behind back Wall slides and circles AROM see above Supine chest press, flexion Supine 1# ER/IR Supine 1# serratus push Supine 1# isometric circles PROM in supine  PATIENT EDUCATION: Education details: POC/HEP Person educated: Patient and Spouse Education method: Programmer, Multimedia, Facilities Manager, Verbal cues, and Handouts Education comprehension: verbalized understanding  HOME EXERCISE PROGRAM: Access Code: [email or web address] URL: [email or web address] Date: 10/03/2023 Prepared by: Ozell Mainland  Exercises - Seated Shoulder Flexion Towel Slide at Table Top  - 2 x daily - 7 x weekly - 1 sets - 10 reps - 5 hold - Seated Elbow Flexion Shoulder Internal Rotation  AAROM at Table with Towel  - 2 x daily - 7 x weekly - 1 sets - 10 reps - 3 hold - Seated Shoulder Shrugs  - 2 x daily - 7 x weekly - 1 sets - 10 reps - 3 hold - Supine Shoulder Press with Dowel  - 2 x daily - 7 x weekly - 1 sets - 10 reps - 3 hold - Supine Shoulder Flexion Extension AAROM with Dowel  - 2 x daily - 7 x weekly - 1 sets - 10 reps - 3 hold - Supine Shoulder Circles  - 2 x daily - 7 x weekly - 1 sets - 10 reps - 3 hold - Supine Shoulder External Rotation Stretch  - 2 x daily - 7 x weekly - 1 sets - 10 reps - 3 hold  ASSESSMENT:  CLINICAL IMPRESSION: Patient is a 73 y.o. female who participated  today in skilled physical therapy treatment for right proximal humerus fracture, not surgically repaired. Had MRI, the images were read this AM I copied the results above and went over with her.  Recommended that she see the MD and we cancelled the next few visits to let her see the MD before we proceed. Continued to strengthen around the shoulder. She does well with slight assist like wall slide or holding the ball, overhead she has difficulty extending the elbow fully. Tactile cues so patient will not twist trunk with shoulder external rotation. Pt has good PROM, cues needed to get pt to relax and not guard.  OBJECTIVE IMPAIRMENTS: decreased activity tolerance, decreased coordination, decreased endurance, decreased ROM, decreased strength, increased edema, increased fascial restrictions, impaired perceived functional ability, increased muscle spasms, impaired flexibility, impaired UE functional use, improper body mechanics, postural dysfunction, and pain.   REHAB POTENTIAL: Good  CLINICAL DECISION MAKING: Evolving/moderate complexity  EVALUATION COMPLEXITY: Low   GOALS: Goals reviewed with patient? Yes  SHORT TERM GOALS: Target date: 10/18/23  Independent with initial HEP Goal status: met 10/22/23  LONG TERM GOALS: Target date: 01/03/24  Independent with advanced HEP Goal status:  INITIAL  2.  Dress without difficulty Goal status: Progressing  11/26/23  3.  Reach into cabinet without pain Goal status: progressing 11/26/23  4.  Increase AROM of the right shoulder to 130 degrees Goal status: progressing 11/26/23  5.  Wash hair without help Goal status: progressing 11/10/23  6.  Increase ER to 65 degrees actively Goal status: Progressing 11/06/23  PLAN:  PT FREQUENCY: 1-2x/week  PT DURATION: 12 weeks  PLANNED INTERVENTIONS: 97164- PT Re-evaluation, 97110-Therapeutic exercises, 97530- Therapeutic activity, 97112- Neuromuscular re-education, 97535- Self Care, 02859- Manual therapy, 97014- Electrical stimulation (unattended), 97016- Vasopneumatic device, Patient/Family education, Taping, Dry Needling, Cryotherapy, and Moist heat  PLAN FOR NEXT SESSION: will see what the plan for the MD is, cancelled the next visit to see if she can speak with MD   OBADIAH OZELL ORN, PT 11/26/2023, 8:50 AM

## 2023-12-01 ENCOUNTER — Ambulatory Visit: Payer: Medicare Other | Admitting: Physical Therapy

## 2023-12-02 ENCOUNTER — Other Ambulatory Visit: Payer: Self-pay

## 2023-12-02 MED ORDER — ESTRADIOL 0.5 MG PO TABS: 0.5000 mg | ORAL_TABLET | Freq: Every day | ORAL | 1 refills | Status: DC

## 2023-12-03 ENCOUNTER — Ambulatory Visit: Payer: Medicare Other | Admitting: Physical Therapy

## 2023-12-09 ENCOUNTER — Encounter: Payer: Self-pay | Admitting: Family Medicine

## 2023-12-09 MED ORDER — EZETIMIBE 10 MG PO TABS: 10.0000 mg | ORAL_TABLET | Freq: Every day | ORAL | 2 refills | Status: DC

## 2023-12-16 ENCOUNTER — Other Ambulatory Visit: Payer: Self-pay | Admitting: Orthopaedic Surgery

## 2023-12-16 DIAGNOSIS — G8929 Other chronic pain: Secondary | ICD-10-CM

## 2023-12-18 ENCOUNTER — Telehealth: Payer: Self-pay | Admitting: Family Medicine

## 2023-12-18 NOTE — Telephone Encounter (Signed)
Spoke with sherry from ortho and provided her with the fax number in the back office to ensure we received it.

## 2023-12-18 NOTE — Telephone Encounter (Signed)
Copied from CRM [id number]. Topic: Medical Record Request - Provider/Facility Request >> Dec 18, 2023  2:44 PM Fuller Mandril wrote: Reason for CRM: Cordelia Pen called from [facility] about clearance for patient surgery on 2/19. Faxed form on 1/27 will refax now. Thank You

## 2023-12-19 ENCOUNTER — Ambulatory Visit
Admission: RE | Admit: 2023-12-19 | Discharge: 2023-12-19 | Disposition: A | Discharge status: Home / Self Care | Payer: Medicare Other | Source: Ambulatory Visit | Attending: Orthopaedic Surgery

## 2023-12-19 DIAGNOSIS — G8929 Other chronic pain: Secondary | ICD-10-CM

## 2023-12-19 NOTE — Telephone Encounter (Signed)
Faxed received pt scheduled for 12/29/23. Roselyn Reef, CMA

## 2023-12-22 ENCOUNTER — Ambulatory Visit: Payer: Medicare Other | Admitting: Family Medicine

## 2023-12-22 ENCOUNTER — Encounter: Payer: Self-pay | Admitting: Family Medicine

## 2023-12-22 VITALS — BP 124/67 | HR 68 | Ht 63.5 in | Wt 161.4 lb

## 2023-12-22 DIAGNOSIS — Z01818 Encounter for other preprocedural examination: Secondary | ICD-10-CM | POA: Diagnosis not present

## 2023-12-22 NOTE — Patient Instructions (Signed)
Stop estradiol today and resume 7 days after surgery

## 2023-12-22 NOTE — Progress Notes (Signed)
Established patient visit   Patient: Melissa Mcdowell   DOB: February 10, 1951   73 y.o. Female  MRN: [id number] Visit Date: 12/22/2023  Today's healthcare provider: Charlton Amor, DO   Chief Complaint  Patient presents with   Pre-op Exam    Shoulder surgery    SUBJECTIVE    Chief Complaint  Patient presents with   Pre-op Exam    Shoulder surgery   HPI HPI     Pre-op Exam    Additional comments: Shoulder surgery      Last edited by Roselyn Reef, CMA on 12/22/2023  9:40 AM.      Pt presents for pre-op evaluation for R reverse total shoulder arthroplasty. Pmh includes osteopenia, post menopausal symptoms and HLD.   Review of Systems  Constitutional:  Negative for activity change, fatigue and fever.  Respiratory:  Negative for cough and shortness of breath.   Cardiovascular:  Negative for chest pain.  Gastrointestinal:  Negative for abdominal pain.  Genitourinary:  Negative for difficulty urinating.       Current Meds  Medication Sig   ascorbic acid (VITAMIN C) 500 MG tablet Take 1,000 mg by mouth daily.   Calcium Carbonate-Vit D-Min (CALCIUM 600+D PLUS MINERALS) 600-400 MG-UNIT TABS 1 tab p.o. twice daily   estradiol (ESTRACE) 0.5 MG tablet Take 1 tablet (0.5 mg total) by mouth daily.   ezetimibe (ZETIA) 10 MG tablet Take 1 tablet (10 mg total) by mouth daily.   Fluticasone Propionate (KLS ALLER-FLO NA) Place into the nose.   loratadine (CLARITIN) 10 MG tablet Take 10 mg by mouth daily.   tiZANidine (ZANAFLEX) 2 MG tablet Take 1-2 tablets (2-4 mg total) by mouth every 8 (eight) hours as needed for muscle spasms.    OBJECTIVE    BP 124/67 (BP Location: Left Arm, Patient Position: Sitting, Cuff Size: Large)   Pulse 68   Ht 5' 3.5" (1.613 m)   Wt 161 lb 6 oz (73.2 kg)   SpO2 99%   BMI 28.14 kg/m   Physical Exam Vitals and nursing note reviewed.  Constitutional:      General: She is not in acute distress.    Appearance: Normal appearance.  HENT:      Head: Normocephalic and atraumatic.     Right Ear: External ear normal.     Left Ear: External ear normal.     Nose: Nose normal.  Eyes:     Conjunctiva/sclera: Conjunctivae normal.  Cardiovascular:     Rate and Rhythm: Normal rate and regular rhythm.  Pulmonary:     Effort: Pulmonary effort is normal.     Breath sounds: Normal breath sounds.  Neurological:     General: No focal deficit present.     Mental Status: She is alert and oriented to person, place, and time.  Psychiatric:        Mood and Affect: Mood normal.        Behavior: Behavior normal.        Thought Content: Thought content normal.        Judgment: Judgment normal.        ASSESSMENT & PLAN    Problem List Items Addressed This Visit       Other   Pre-op evaluation - Primary   Pt presents for pre-op evaluation. Phm significant for HLD on zetia and post menopausal symptoms for which patient is on estradiol. It is recommended she stop this medication 2-4 weeks prior to surgery according to guidelines  to decrease risk of blood clots. Recommend resuming estradiol 7 days post op. Some mixed reviews about restarting immediately following surgery vs waiting however with patient being older and concern for some sedentary activity post op I would prefer we wait 7 days post op prior to restarting to prevent any risk of dvt. According to preop paperwork they will obtain all labs the day of surgery. No EKG recommended for this patient due to lack of cardiac history. From limited exam today pt is at low risk for R reverse total shoulder arthroplasty.        No follow-ups on file.      No orders of the defined types were placed in this encounter.   No orders of the defined types were placed in this encounter.    Charlton Amor, DO  [facility] [phone] (phone) [phone] (fax)  [facility]

## 2023-12-22 NOTE — Assessment & Plan Note (Addendum)
Pt presents for pre-op evaluation. Phm significant for HLD on zetia and post menopausal symptoms for which patient is on estradiol. It is recommended she stop this medication 2-4 weeks prior to surgery according to guidelines to decrease risk of blood clots. Recommend resuming estradiol 7 days post op. Some mixed reviews about restarting immediately following surgery vs waiting however with patient being older and concern for some sedentary activity post op I would prefer we wait 7 days post op prior to restarting to prevent any risk of dvt. According to preop paperwork they will obtain all labs the day of surgery. No EKG recommended for this patient due to lack of cardiac history. From limited exam today pt is at low risk for R reverse total shoulder arthroplasty.

## 2024-01-12 ENCOUNTER — Ambulatory Visit: Payer: Medicare Other | Attending: Orthopaedic Surgery | Admitting: Physical Therapy

## 2024-01-12 ENCOUNTER — Encounter: Payer: Self-pay | Admitting: Physical Therapy

## 2024-01-12 DIAGNOSIS — M25611 Stiffness of right shoulder, not elsewhere classified: Secondary | ICD-10-CM | POA: Insufficient documentation

## 2024-01-12 DIAGNOSIS — M25511 Pain in right shoulder: Secondary | ICD-10-CM | POA: Insufficient documentation

## 2024-01-12 DIAGNOSIS — Z96611 Presence of right artificial shoulder joint: Secondary | ICD-10-CM | POA: Diagnosis present

## 2024-01-12 DIAGNOSIS — M6281 Muscle weakness (generalized): Secondary | ICD-10-CM | POA: Diagnosis present

## 2024-01-12 NOTE — Therapy (Signed)
 OUTPATIENT PHYSICAL THERAPY SHOULDER EVALUATION   Patient Name: Melissa Mcdowell MRN: [id number] DOB:1950-11-25, 73 y.o., female Today's Date: 01/12/2024  END OF SESSION:   Past Medical History:  Diagnosis Date   Acid reflux    occasional   Anemia    no current med.   Arthritis    lower back, fingers   Complication of anesthesia    states has a small mouth   Dental crowns present    Headache(784.0)    sinus - 3-4 x/month   Hypercholesteremia    Mucous cyst of finger 06/18/2012   left long finger   Past Surgical History:  Procedure Laterality Date   ABDOMINAL HYSTERECTOMY     complete   APPENDECTOMY     CATARACT EXTRACTION Bilateral    CESAREAN SECTION     ECTOPIC PREGNANCY SURGERY     MASS EXCISION  07/10/2012   Procedure: MINOR EXCISION OF MASS;  Surgeon: Wyn Forster., MD;  Location: [facility];  Service: Orthopedics;  Laterality: Left;   OOPHORECTOMY     REPAIR OF COMPLEX TRACTION RETINAL DETACHMENT Left 12/23/2018   Procedure: REPAIR OF  RETINAL DETACHMENT LEFT EYE;  Surgeon: Carmela Rima, MD;  Location: [facility] OR;  Service: Ophthalmology;  Laterality: Left;   TOTAL HIP ARTHROPLASTY Right 08/12/2023   Procedure: TOTAL HIP ARTHROPLASTY ANTERIOR APPROACH;  Surgeon: Durene Romans, MD;  Location: [facility];  Service: Orthopedics;  Laterality: Right;   Patient Active Problem List   Diagnosis Date Noted   Pre-op evaluation 12/22/2023   Osteopenia 11/07/2023   Low hemoglobin 08/20/2023   Post-menopausal 08/20/2023   S/P total right hip arthroplasty 08/12/2023   Closed right hip fracture, initial encounter (HCC) 08/11/2023   Closed fracture of proximal end of right humerus 08/11/2023   Hyperglycemia 08/11/2023   Hypercalcemia 08/11/2023   Primary insomnia 05/26/2023   Elevated blood pressure reading 05/26/2023    PCP: Tamera Punt, DO  REFERRING PROVIDER: Everardo Pacific, MD  REFERRING DIAG: s/p right reverse TSA  THERAPY DIAG:  No diagnosis  found.  Rationale for Evaluation and Treatment: Rehabilitation  ONSET DATE: 01/07/24  SUBJECTIVE:                                                                                                                                                                                      SUBJECTIVE STATEMENT: Patient had a fall 08/11/23. She sustained a right proximal humerus fracture.  She ended up with some AVN and the bone died, she underwent a right reverse TSA on 01/07/24.  She has been in sling, she reports that she is trying to not take as many narcotics.  Her PA  called me last week and told me to avoid any shoulder motions for the first 4 weeks. Hand dominance: Right  PERTINENT HISTORY: THA  PAIN:  Are you having pain? Yes: NPRS scale: 1/10 Pain location: right shoulder Pain description: ache, sore Aggravating factors: as the day goes no, trying to not take pain meds pain 7/10 Relieving factors: ice, being in the sling and the pain meds, pain can be 0/10  PRECAUTIONS: Other: Reverse TSA PA called me and told me no ROM of the shoulder for the first 4 weeks, no pendulums  RED FLAGS: None   WEIGHT BEARING RESTRICTIONS: No  FALLS:  Has patient fallen in last 6 months? No  LIVING ENVIRONMENT: Lives with: lives with their family Lives in: House/apartment Stairs: Yes: Internal: 12 steps; on right going up Has following equipment at home: None  OCCUPATION: retired  PLOF: Independent and does yardwork, housework  PATIENT GOALS:have better ROM, less pain, get dressed  NEXT MD VISIT:   OBJECTIVE:  Note: Objective measures were completed at Evaluation unless otherwise noted.  DIAGNOSTIC FINDINGS:  none  PATIENT SURVEYS:  Quick Dash 84%  COGNITION: Overall cognitive status: Within functional limits for tasks assessed     SENSATION: WFL  POSTURE: Fwd head, rounded shoulders, right shoulder elevated some  UPPER EXTREMITY ROM:   Passive ROM Right eval Left eval   Shoulder flexion    Shoulder extension    Shoulder abduction    Shoulder adduction    Shoulder internal rotation    Shoulder external rotation    Elbow flexion    Elbow extension    Wrist flexion    Wrist extension    Wrist ulnar deviation    Wrist radial deviation    Wrist pronation    Wrist supination    (Blank rows = not tested)  UPPER EXTREMITY MMT:  MMT Right eval Left eval  Shoulder flexion    Shoulder extension    Shoulder abduction    Shoulder adduction    Shoulder internal rotation    Shoulder external rotation    Middle trapezius    Lower trapezius    Elbow flexion    Elbow extension    Wrist flexion    Wrist extension    Wrist ulnar deviation    Wrist radial deviation    Wrist pronation    Wrist supination    Grip strength (lbs)    (Blank rows = not tested)  JOINT MOBILITY TESTING:  Not tested  PALPATION:  Tightness in the upper trap, tight in the                                                                                                                              TREATMENT DATE:  01/12/24 Evaluation   PATIENT EDUCATION: Education details: HEP/POC Person educated: Patient and Spouse Education method: Programmer, multimedia, Facilities manager, Verbal cues, and Handouts Education comprehension: verbalized understanding  HOME EXERCISE PROGRAM: Access Code: [email or web address] URL: [email or web address]  Date: 01/12/2024 Prepared by: Stacie Glaze  Exercises - Standing Elbow Flexion Extension AROM  - 2 x daily - 7 x weekly - 1 sets - 10 reps - 3 hold - Standing Forearm Pronation and Supination AROM  - 2 x daily - 7 x weekly - 1 sets - 10 reps - 3 hold - Thumb Opposition  - 2 x daily - 7 x weekly - 1 sets - 10 reps - 3 hold - Seated Shoulder Shrug Circles AROM Backward  - 2 x daily - 7 x weekly - 1 sets - 10 reps - 3 hold - Seated Scapular Retraction  - 2 x daily - 7 x weekly - 1 sets - 10 reps - 3 hold  ASSESSMENT:  CLINICAL  IMPRESSION: Patient is a 73 y.o. female who was seen today for physical therapy evaluation and treatment for s/p right TSA on 01/07/24.  She had a fall in September and fractured the proximal humerus, she developed AVN and the bone was not healing.  The PA called me on Thursday and told me that she wanted [place] to be very careful and avoid any shoulder motions for the first 4 weeks, avoid even pendulums.  She reports that she is doing well in the sling and trying to not take much pain medication.  OBJECTIVE IMPAIRMENTS: cardiopulmonary status limiting activity, decreased activity tolerance, decreased coordination, decreased endurance, decreased ROM, decreased strength, increased edema, increased fascial restrictions, impaired perceived functional ability, increased muscle spasms, impaired flexibility, impaired UE functional use, impaired vision/preception, improper body mechanics, postural dysfunction, and pain.   REHAB POTENTIAL: Good  CLINICAL DECISION MAKING: Evolving/moderate complexity  EVALUATION COMPLEXITY: Moderate   GOALS: Goals reviewed with patient? Yes  SHORT TERM GOALS: Target date: 02/09/24  Independent with RICE Baseline: Goal status: INITIAL   LONG TERM GOALS: Target date: 04/10/24  Independent with HEP Baseline:  Goal status: INITIAL  2.  Decrease pain 50% Baseline: pain up to 7/10 at times Goal status: INITIAL  3.  Report able to dress without help Baseline: husband currently dresses her Goal status: INITIAL  4.  Be able to do hair without help Baseline: husband is doing her hair Goal status: INITIAL  5.  Increase AROM of the right shoulder flexion to 110 degrees Baseline: no motions Goal status: INITIAL  6.  Be able to reach head high cabinet with 3# Baseline: unable to lift arm Goal status: INITIAL  PLAN:  PT FREQUENCY: 2x/week  PT DURATION: 12 weeks  PLANNED INTERVENTIONS: 97164- PT Re-evaluation, 97110-Therapeutic exercises, 97530- Therapeutic  activity, 97112- Neuromuscular re-education, 97535- Self Care, 28413- Manual therapy, G0283- Electrical stimulation (unattended), 97016- Vasopneumatic device, Patient/Family education, Joint mobilization, Scar mobilization, and Cryotherapy  PLAN FOR NEXT SESSION: PA reports no shoulder motions for the first four weeks, will start PT March 24th, follow protocol unless notified by PA/MD   Jearld Lesch, PT 01/12/2024, 7:39 AM

## 2024-01-15 ENCOUNTER — Ambulatory Visit: Payer: Medicare Other | Admitting: Physical Therapy

## 2024-01-19 ENCOUNTER — Ambulatory Visit: Payer: Medicare Other | Admitting: Physical Therapy

## 2024-01-22 ENCOUNTER — Ambulatory Visit: Payer: Medicare Other | Admitting: Physical Therapy

## 2024-01-26 ENCOUNTER — Ambulatory Visit: Payer: Medicare Other | Admitting: Physical Therapy

## 2024-01-29 ENCOUNTER — Ambulatory Visit: Payer: Medicare Other | Admitting: Physical Therapy

## 2024-02-06 ENCOUNTER — Encounter: Payer: Self-pay | Admitting: Sports Medicine

## 2024-02-06 NOTE — Telephone Encounter (Signed)
 Please get her set up for a virtual visit maybe next week.

## 2024-02-09 ENCOUNTER — Encounter: Payer: Self-pay | Admitting: Physical Therapy

## 2024-02-09 ENCOUNTER — Ambulatory Visit: Payer: Medicare Other | Attending: Orthopaedic Surgery | Admitting: Physical Therapy

## 2024-02-09 DIAGNOSIS — M6281 Muscle weakness (generalized): Secondary | ICD-10-CM | POA: Diagnosis present

## 2024-02-09 DIAGNOSIS — Z96611 Presence of right artificial shoulder joint: Secondary | ICD-10-CM | POA: Insufficient documentation

## 2024-02-09 DIAGNOSIS — M25611 Stiffness of right shoulder, not elsewhere classified: Secondary | ICD-10-CM | POA: Diagnosis present

## 2024-02-09 DIAGNOSIS — M25511 Pain in right shoulder: Secondary | ICD-10-CM | POA: Diagnosis present

## 2024-02-09 NOTE — Therapy (Signed)
 OUTPATIENT PHYSICAL THERAPY SHOULDER EVALUATION   Patient Name: Melissa Mcdowell MRN: [id number] DOB:1951/05/22, 73 y.o., female Today's Date: 02/09/2024  END OF SESSION:  PT End of Session - 02/09/24 0843     Visit Number 2    Number of Visits 7    Date for PT Re-Evaluation 04/05/24    Authorization Type [employer]    PT Start Time [phone]    PT Stop Time 0928    PT Time Calculation (min) 45 min    Activity Tolerance Patient tolerated treatment well    Behavior During Therapy [facility] for tasks assessed/performed             Past Medical History:  Diagnosis Date   Acid reflux    occasional   Anemia    no current med.   Arthritis    lower back, fingers   Complication of anesthesia    states has a small mouth   Dental crowns present    Headache(784.0)    sinus - 3-4 x/month   Hypercholesteremia    Mucous cyst of finger 06/18/2012   left long finger   Past Surgical History:  Procedure Laterality Date   ABDOMINAL HYSTERECTOMY     complete   APPENDECTOMY     CATARACT EXTRACTION Bilateral    CESAREAN SECTION     ECTOPIC PREGNANCY SURGERY     MASS EXCISION  07/10/2012   Procedure: MINOR EXCISION OF MASS;  Surgeon: Wyn Forster., MD;  Location: [facility];  Service: Orthopedics;  Laterality: Left;   OOPHORECTOMY     REPAIR OF COMPLEX TRACTION RETINAL DETACHMENT Left 12/23/2018   Procedure: REPAIR OF  RETINAL DETACHMENT LEFT EYE;  Surgeon: Carmela Rima, MD;  Location: [facility] OR;  Service: Ophthalmology;  Laterality: Left;   TOTAL HIP ARTHROPLASTY Right 08/12/2023   Procedure: TOTAL HIP ARTHROPLASTY ANTERIOR APPROACH;  Surgeon: Durene Romans, MD;  Location: [facility];  Service: Orthopedics;  Laterality: Right;   Patient Active Problem List   Diagnosis Date Noted   Pre-op evaluation 12/22/2023   Osteopenia 11/07/2023   Low hemoglobin 08/20/2023   Post-menopausal 08/20/2023   S/P total right hip arthroplasty 08/12/2023   Closed right hip fracture, initial  encounter (HCC) 08/11/2023   Closed fracture of proximal end of right humerus 08/11/2023   Hyperglycemia 08/11/2023   Hypercalcemia 08/11/2023   Primary insomnia 05/26/2023   Elevated blood pressure reading 05/26/2023    PCP: Tamera Punt, DO  REFERRING PROVIDER: Everardo Pacific, MD  REFERRING DIAG: s/p right reverse TSA  THERAPY DIAG:  Acute pain of right shoulder  Stiffness of right shoulder, not elsewhere classified  Muscle weakness (generalized)  S/P reverse total shoulder arthroplasty, right  Rationale for Evaluation and Treatment: Rehabilitation  ONSET DATE: 01/07/24  SUBJECTIVE:  SUBJECTIVE STATEMENT: [employer] authorized 6 visits up front for this reverse TSA.  Patient is upset, reports that she is doing okay saw MD on Friday and we are at 4 weeks S/P surgery.  Patient had a fall 08/11/23. She sustained a right proximal humerus fracture.  She ended up with some AVN and the bone died, she underwent a right reverse TSA on 01/07/24.  She has been in sling, she reports that she is trying to not take as many narcotics.  Her PA called me last week and told me to avoid any shoulder motions for the first 4 weeks. Hand dominance: Right  PERTINENT HISTORY: THA  PAIN:  Are you having pain? Yes: NPRS scale: 1/10 Pain location: right shoulder Pain description: ache, sore Aggravating factors: as the day goes no, trying to not take pain meds pain 7/10 Relieving factors: ice, being in the sling and the pain meds, pain can be 0/10  PRECAUTIONS: Other: Reverse TSA PA called me and told me no ROM of the shoulder for the first 4 weeks, no pendulums  RED FLAGS: None   WEIGHT BEARING RESTRICTIONS: No  FALLS:  Has patient fallen in last 6 months? No  LIVING ENVIRONMENT: Lives with: lives with their family Lives in:  House/apartment Stairs: Yes: Internal: 12 steps; on right going up Has following equipment at home: None  OCCUPATION: retired  PLOF: Independent and does yardwork, housework  PATIENT GOALS:have better ROM, less pain, get dressed  NEXT MD VISIT:   OBJECTIVE:  Note: Objective measures were completed at Evaluation unless otherwise noted.  DIAGNOSTIC FINDINGS:  none  PATIENT SURVEYS:  Quick Dash 84%  COGNITION: Overall cognitive status: Within functional limits for tasks assessed     SENSATION: WFL  POSTURE: Fwd head, rounded shoulders, right shoulder elevated some  UPPER EXTREMITY ROM:   Passive ROM Right eval Left eval PROM  Right 02/09/24  Shoulder flexion   80  Shoulder extension     Shoulder abduction   70  Shoulder adduction     Shoulder internal rotation     Shoulder external rotation   20  Elbow flexion     Elbow extension     Wrist flexion     Wrist extension     Wrist ulnar deviation     Wrist radial deviation     Wrist pronation     Wrist supination     (Blank rows = not tested)  UPPER EXTREMITY MMT:  MMT Right eval Left eval  Shoulder flexion    Shoulder extension    Shoulder abduction    Shoulder adduction    Shoulder internal rotation    Shoulder external rotation    Middle trapezius    Lower trapezius    Elbow flexion    Elbow extension    Wrist flexion    Wrist extension    Wrist ulnar deviation    Wrist radial deviation    Wrist pronation    Wrist supination    Grip strength (lbs)    (Blank rows = not tested)  JOINT MOBILITY TESTING:  Not tested  PALPATION:  Tightness in the upper trap, tight in the  TREATMENT DATE:  02/09/24 HEP below, educated patient performed with her PROM of the right shoulder with her supine, education of the spouse in the same and went over protocol and the ROM  limitations  01/12/24 Evaluation   PATIENT EDUCATION: Education details: HEP/POC Person educated: Patient and Spouse Education method: Programmer, multimedia, Facilities manager, Verbal cues, and Handouts Education comprehension: verbalized understanding  HOME EXERCISE PROGRAM: Access Code: [email or web address] URL: [email or web address] Date: 02/09/2024 Prepared by: Stacie Glaze  Exercises - Supine Shoulder Flexion AAROM with Hands Clasped  - 2 x daily - 7 x weekly - 2 sets - 10 reps - 5 hold - Seated Shoulder External Rotation AAROM with Cane and Hand in Neutral  - 2 x daily - 7 x weekly - 2 sets - 10 reps - 5 hold - Seated Shoulder Abduction AAROM with Dowel  - 2 x daily - 7 x weekly - 2 sets - 10 reps - 5 hold - Seated Elbow Flexion Shoulder Internal Rotation AAROM at Table with Towel  - 2 x daily - 7 x weekly - 2 sets - 10 reps - 5 hold - Seated Shoulder Flexion Towel Slide at Table Top  - 2 x daily - 7 x weekly - 2 sets - 10 reps - 5 hold  Access Code: [email or web address] URL: [email or web address] Date: 01/12/2024 Prepared by: Stacie Glaze  Exercises - Standing Elbow Flexion Extension AROM  - 2 x daily - 7 x weekly - 1 sets - 10 reps - 3 hold - Standing Forearm Pronation and Supination AROM  - 2 x daily - 7 x weekly - 1 sets - 10 reps - 3 hold - Thumb Opposition  - 2 x daily - 7 x weekly - 1 sets - 10 reps - 3 hold - Seated Shoulder Shrug Circles AROM Backward  - 2 x daily - 7 x weekly - 1 sets - 10 reps - 3 hold - Seated Scapular Retraction  - 2 x daily - 7 x weekly - 1 sets - 10 reps - 3 hold  ASSESSMENT:  CLINICAL IMPRESSION: The patients insurance authorized 6 visits for her reverse TSA.  I initiated the HEP following the protocol from the MD, we then performed PROM and educated husband, she is very tight and sore with PROM, started some AAROM.  Protocol is in the accordion file.  Patient is a 73 y.o. female who was seen today for physical therapy evaluation and  treatment for s/p right TSA on 01/07/24.  She had a fall in September and fractured the proximal humerus, she developed AVN and the bone was not healing.  The PA called me on Thursday and told me that she wanted [place] to be very careful and avoid any shoulder motions for the first 4 weeks, avoid even pendulums.  She reports that she is doing well in the sling and trying to not take much pain medication.  OBJECTIVE IMPAIRMENTS: cardiopulmonary status limiting activity, decreased activity tolerance, decreased coordination, decreased endurance, decreased ROM, decreased strength, increased edema, increased fascial restrictions, impaired perceived functional ability, increased muscle spasms, impaired flexibility, impaired UE functional use, impaired vision/preception, improper body mechanics, postural dysfunction, and pain.   REHAB POTENTIAL: Good  CLINICAL DECISION MAKING: Evolving/moderate complexity  EVALUATION COMPLEXITY: Moderate   GOALS: Goals reviewed with patient? Yes  SHORT TERM GOALS: Target date: 02/09/24  Independent with RICE Baseline: Goal status: met 02/09/24   LONG TERM GOALS: Target date: 04/10/24  Independent with HEP Baseline:  Goal status: INITIAL  2.  Decrease pain  50% Baseline: pain up to 7/10 at times Goal status: INITIAL  3.  Report able to dress without help Baseline: husband currently dresses her Goal status: INITIAL  4.  Be able to do hair without help Baseline: husband is doing her hair Goal status: INITIAL  5.  Increase AROM of the right shoulder flexion to 110 degrees Baseline: no motions Goal status: INITIAL  6.  Be able to reach head high cabinet with 3# Baseline: unable to lift arm Goal status: INITIAL  PLAN:  PT FREQUENCY: 2x/week  PT DURATION: 12 weeks  PLANNED INTERVENTIONS: 97164- PT Re-evaluation, 97110-Therapeutic exercises, 97530- Therapeutic activity, 97112- Neuromuscular re-education, 97535- Self Care, 16109- Manual therapy, G0283-  Electrical stimulation (unattended), 97016- Vasopneumatic device, Patient/Family education, Joint mobilization, Scar mobilization, and Cryotherapy  PLAN FOR NEXT SESSION: Follow MD protocol in accordion file   Elliotte Marsalis W, PT 02/09/2024, 9:27 AM

## 2024-02-12 ENCOUNTER — Other Ambulatory Visit (HOSPITAL_COMMUNITY): Payer: Self-pay

## 2024-02-12 ENCOUNTER — Telehealth: Payer: Self-pay

## 2024-02-12 ENCOUNTER — Encounter: Payer: Self-pay | Admitting: Sports Medicine

## 2024-02-12 ENCOUNTER — Telehealth: Payer: Self-pay | Admitting: Sports Medicine

## 2024-02-12 ENCOUNTER — Ambulatory Visit: Payer: Medicare Other | Admitting: Physical Therapy

## 2024-02-12 ENCOUNTER — Telehealth: Admitting: Sports Medicine

## 2024-02-12 DIAGNOSIS — M8000XD Age-related osteoporosis with current pathological fracture, unspecified site, subsequent encounter for fracture with routine healing: Secondary | ICD-10-CM | POA: Diagnosis not present

## 2024-02-12 DIAGNOSIS — M8000XS Age-related osteoporosis with current pathological fracture, unspecified site, sequela: Secondary | ICD-10-CM

## 2024-02-12 NOTE — Telephone Encounter (Signed)
 Pharmacy Patient Advocate Encounter   Received notification from Pt Calls Messages that prior authorization for Prolia is required/requested.   Insurance verification completed.   The patient is insured through [facility] .   Per test claim: PA required; PA started via CoverMyMeds. KEY [id number] . Waiting for clinical questions to populate.

## 2024-02-12 NOTE — Assessment & Plan Note (Signed)
 Melissa Mcdowell is a very pleasant 73 year old female, she has osteoporosis, she has a T-score of -2.2, combined with her recent osteoporotic type fracture we are giving her the diagnosis of osteoporosis per She is currently on Estrace, calcium/vitamin D. We had a discussion about the pathophysiology of osteoporosis, she understands the importance of stabilizing osteoblastic/osteoclastic activity. She is interested in Prolia. We will going get her renal function and calcium levels and get her set up for Prolia.

## 2024-02-12 NOTE — Telephone Encounter (Signed)
 Dr. Karie Schwalbe, that is correct. Here is the latest outcome from the RX PA team:  Pharmacy Patient Advocate Encounter   Received notification from Pt Calls Messages that prior authorization for Prolia is required/requested.   Insurance verification completed.   The patient is insured through [facility] .   Per test claim: PA required; PA started via CoverMyMeds. KEY [id number] . Waiting for clinical questions to populate.

## 2024-02-12 NOTE — Telephone Encounter (Signed)
 A msg has been sent to the RX PA team to obtain the prior auth for Prolia.

## 2024-02-12 NOTE — Telephone Encounter (Signed)
 Hey vanicia, I cced this to the Rx auth pool when I made the note, making sure that was the right thing?

## 2024-02-12 NOTE — Telephone Encounter (Signed)
 Prior auth required for Prolia. Thanks in advance.

## 2024-02-12 NOTE — Telephone Encounter (Signed)
 Good morning, patient has osteoporosis, she does have a bone density test with a T-score -2.2, but she did have an osteoporotic type fracture so we are giving her the diagnosis of osteoporosis.  Please work on approval for Prolia which we can administer here in the office. She will be getting her renal function and calcium checked.

## 2024-02-12 NOTE — Progress Notes (Signed)
   Virtual Visit via WebEx/MyChart   I connected with  Melissa Mcdowell  on 02/12/24 via WebEx/MyChart/Doximity Video and verified that I am speaking with the correct person using two identifiers.   I discussed the limitations, risks, security and privacy concerns of performing an evaluation and management service by WebEx/MyChart/Doximity Video, including the higher likelihood of inaccurate diagnosis and treatment, and the availability of in person appointments.  We also discussed the likely need of an additional face to face encounter for complete and high quality delivery of care.  I also discussed with the patient that there may be a patient responsible charge related to this service. The patient expressed understanding and wishes to proceed.  Provider location is in medical facility. Patient location is at their home, different from provider location. People involved in care of the patient during this telehealth encounter were myself, my nurse/medical assistant, and my front office/scheduling team member.  Review of Systems: No fevers, chills, night sweats, weight loss, chest pain, or shortness of breath.   Objective Findings:    General: Speaking full sentences, no audible heavy breathing.  Sounds alert and appropriately interactive.  Appears well.  Face symmetric.  Extraocular movements intact.  Pupils equal and round.  No nasal flaring or accessory muscle use visualized.  Independent interpretation of tests performed by another provider:   None.  Brief History, Exam, Impression, and Recommendations:    Age related osteoporosis Melissa Mcdowell is a very pleasant 73 year old female, she has osteoporosis, she has a T-score of -2.2, combined with her recent osteoporotic type fracture we are giving her the diagnosis of osteoporosis per She is currently on Estrace, calcium/vitamin D. We had a discussion about the pathophysiology of osteoporosis, she understands the importance of stabilizing  osteoblastic/osteoclastic activity. She is interested in Prolia. We will going get her renal function and calcium levels and get her set up for Prolia.   I discussed the above assessment and treatment plan with the patient. The patient was provided an opportunity to ask questions and all were answered. The patient agreed with the plan and demonstrated an understanding of the instructions.   The patient was advised to call back or seek an in-person evaluation if the symptoms worsen or if the condition fails to improve as anticipated.   I provided 30 minutes of face to face and non-face-to-face time during this encounter date, time was needed to gather information, review chart, records, communicate/coordinate with staff remotely, as well as complete documentation.   ____________________________________________ Ihor Austin. Benjamin Stain, M.D., ABFM., CAQSM., AME. Primary Care and Sports Medicine [facility]  Adjunct Professor of Family Medicine  [employer]  Restaurant manager, fast food

## 2024-02-13 ENCOUNTER — Encounter: Payer: Self-pay | Admitting: Sports Medicine

## 2024-02-13 NOTE — Telephone Encounter (Signed)
 Added.

## 2024-02-13 NOTE — Telephone Encounter (Signed)
 Please add statins to intolerance list

## 2024-02-16 ENCOUNTER — Ambulatory Visit: Payer: Medicare Other | Admitting: Physical Therapy

## 2024-02-19 ENCOUNTER — Encounter: Payer: Self-pay | Admitting: Physical Therapy

## 2024-02-19 ENCOUNTER — Ambulatory Visit: Payer: Medicare Other | Attending: Orthopaedic Surgery | Admitting: Physical Therapy

## 2024-02-19 DIAGNOSIS — M25611 Stiffness of right shoulder, not elsewhere classified: Secondary | ICD-10-CM | POA: Diagnosis present

## 2024-02-19 DIAGNOSIS — Z96611 Presence of right artificial shoulder joint: Secondary | ICD-10-CM | POA: Diagnosis present

## 2024-02-19 DIAGNOSIS — M6281 Muscle weakness (generalized): Secondary | ICD-10-CM | POA: Diagnosis present

## 2024-02-19 DIAGNOSIS — M25511 Pain in right shoulder: Secondary | ICD-10-CM | POA: Insufficient documentation

## 2024-02-19 LAB — BASIC METABOLIC PANEL WITH GFR
BUN/Creatinine Ratio: 14 (ref 12–28)
BUN: 14 mg/dL (ref 8–27)
CO2: 22 mmol/L (ref 20–29)
Calcium: 9.5 mg/dL (ref 8.7–10.3)
Chloride: 102 mmol/L (ref 96–106)
Creatinine, Ser: 1 mg/dL (ref 0.57–1.00)
Glucose: 89 mg/dL (ref 70–99)
Potassium: 4.5 mmol/L (ref 3.5–5.2)
Sodium: 140 mmol/L (ref 134–144)
eGFR: 60 mL/min/{1.73_m2} (ref >59)

## 2024-02-19 NOTE — Therapy (Signed)
 OUTPATIENT PHYSICAL THERAPY SHOULDER EVALUATION   Patient Name: Melissa Mcdowell MRN: [id number] DOB:1951-10-13, 73 y.o., female Today's Date: 02/19/2024  END OF SESSION:  PT End of Session - 02/19/24 0849     Visit Number 3    Date for PT Re-Evaluation 04/05/24    PT Start Time 0845    PT Stop Time 0930    PT Time Calculation (min) 45 min    Activity Tolerance Patient tolerated treatment well    Behavior During Therapy WFL for tasks assessed/performed             Past Medical History:  Diagnosis Date   Acid reflux    occasional   Anemia    no current med.   Arthritis    lower back, fingers   Complication of anesthesia    states has a small mouth   Dental crowns present    Headache(784.0)    sinus - 3-4 x/month   Hypercholesteremia    Mucous cyst of finger 06/18/2012   left long finger   Past Surgical History:  Procedure Laterality Date   ABDOMINAL HYSTERECTOMY     complete   APPENDECTOMY     CATARACT EXTRACTION Bilateral    CESAREAN SECTION     ECTOPIC PREGNANCY SURGERY     MASS EXCISION  07/10/2012   Procedure: MINOR EXCISION OF MASS;  Surgeon: Wyn Forster., MD;  Location: [facility];  Service: Orthopedics;  Laterality: Left;   OOPHORECTOMY     REPAIR OF COMPLEX TRACTION RETINAL DETACHMENT Left 12/23/2018   Procedure: REPAIR OF  RETINAL DETACHMENT LEFT EYE;  Surgeon: Carmela Rima, MD;  Location: [facility] OR;  Service: Ophthalmology;  Laterality: Left;   TOTAL HIP ARTHROPLASTY Right 08/12/2023   Procedure: TOTAL HIP ARTHROPLASTY ANTERIOR APPROACH;  Surgeon: Durene Romans, MD;  Location: [facility];  Service: Orthopedics;  Laterality: Right;   Patient Active Problem List   Diagnosis Date Noted   Pre-op evaluation 12/22/2023   Age related osteoporosis 11/07/2023   Low hemoglobin 08/20/2023   Post-menopausal 08/20/2023   S/P total right hip arthroplasty 08/12/2023   Closed right hip fracture, initial encounter (HCC) 08/11/2023   Closed  fracture of proximal end of right humerus 08/11/2023   Hyperglycemia 08/11/2023   Hypercalcemia 08/11/2023   Primary insomnia 05/26/2023   Elevated blood pressure reading 05/26/2023    PCP: Tamera Punt, DO  REFERRING PROVIDER: Everardo Pacific, MD  REFERRING DIAG: s/p right reverse TSA  THERAPY DIAG:  Acute pain of right shoulder  Stiffness of right shoulder, not elsewhere classified  Muscle weakness (generalized)  S/P reverse total shoulder arthroplasty, right  Rationale for Evaluation and Treatment: Rehabilitation  ONSET DATE: 01/07/24  SUBJECTIVE:  SUBJECTIVE STATEMENT:   02/19/24 "Fine I sleep"  [employer] authorized 6 visits up front for this reverse TSA.  Patient is upset, reports that she is doing okay saw MD on Friday and we are at 4 weeks S/P surgery.  Patient had a fall 08/11/23. She sustained a right proximal humerus fracture.  She ended up with some AVN and the bone died, she underwent a right reverse TSA on 01/07/24.  She has been in sling, she reports that she is trying to not take as many narcotics.  Her PA called me last week and told me to avoid any shoulder motions for the first 4 weeks. Hand dominance: Right  PERTINENT HISTORY: THA  PAIN:  Are you having pain? Yes: NPRS scale: 1/10 Pain location: right shoulder Pain description: ache, sore Aggravating factors: as the day goes no, trying to not take pain meds pain 7/10 Relieving factors: ice, being in the sling and the pain meds, pain can be 0/10  PRECAUTIONS: Other: Reverse TSA PA called me and told me no ROM of the shoulder for the first 4 weeks, no pendulums  RED FLAGS: None   WEIGHT BEARING RESTRICTIONS: No  FALLS:  Has patient fallen in last 6 months? No  LIVING ENVIRONMENT: Lives with: lives with their family Lives in:  House/apartment Stairs: Yes: Internal: 12 steps; on right going up Has following equipment at home: None  OCCUPATION: retired  PLOF: Independent and does yardwork, housework  PATIENT GOALS:have better ROM, less pain, get dressed  NEXT MD VISIT:   OBJECTIVE:  Note: Objective measures were completed at Evaluation unless otherwise noted.  DIAGNOSTIC FINDINGS:  none  PATIENT SURVEYS:  Quick Dash 84%  COGNITION: Overall cognitive status: Within functional limits for tasks assessed     SENSATION: WFL  POSTURE: Fwd head, rounded shoulders, right shoulder elevated some  UPPER EXTREMITY ROM:   Passive ROM Right eval Left eval PROM  Right 02/09/24  Shoulder flexion   80  Shoulder extension     Shoulder abduction   70  Shoulder adduction     Shoulder internal rotation     Shoulder external rotation   20  Elbow flexion     Elbow extension     Wrist flexion     Wrist extension     Wrist ulnar deviation     Wrist radial deviation     Wrist pronation     Wrist supination     (Blank rows = not tested)  UPPER EXTREMITY MMT:  MMT Right eval Left eval  Shoulder flexion    Shoulder extension    Shoulder abduction    Shoulder adduction    Shoulder internal rotation    Shoulder external rotation    Middle trapezius    Lower trapezius    Elbow flexion    Elbow extension    Wrist flexion    Wrist extension    Wrist ulnar deviation    Wrist radial deviation    Wrist pronation    Wrist supination    Grip strength (lbs)    (Blank rows = not tested)  JOINT MOBILITY TESTING:  Not tested  PALPATION:  Tightness in the upper trap, tight in the  TREATMENT DATE:  02/19/24 AAROM Flex, Ext, IR up back 1lb WaTE  Rows & Ext yellow 2x10 RUE isometrics 3''x5 flex, ext, IR and ER RUE PROM with end range holds within protocol limitations  RUE GH Jt  mobs grade 1 Vaso RUE low pressure 34 deg x 10 min.  02/09/24 HEP below, educated patient performed with her PROM of the right shoulder with her supine, education of the spouse in the same and went over protocol and the ROM limitations  01/12/24 Evaluation   PATIENT EDUCATION: Education details: HEP/POC Person educated: Patient and Spouse Education method: Programmer, multimedia, Facilities manager, Verbal cues, and Handouts Education comprehension: verbalized understanding  HOME EXERCISE PROGRAM: Access Code: [email or web address] URL: [email or web address] Date: 02/09/2024 Prepared by: Stacie Glaze  Exercises - Supine Shoulder Flexion AAROM with Hands Clasped  - 2 x daily - 7 x weekly - 2 sets - 10 reps - 5 hold - Seated Shoulder External Rotation AAROM with Cane and Hand in Neutral  - 2 x daily - 7 x weekly - 2 sets - 10 reps - 5 hold - Seated Shoulder Abduction AAROM with Dowel  - 2 x daily - 7 x weekly - 2 sets - 10 reps - 5 hold - Seated Elbow Flexion Shoulder Internal Rotation AAROM at Table with Towel  - 2 x daily - 7 x weekly - 2 sets - 10 reps - 5 hold - Seated Shoulder Flexion Towel Slide at Table Top  - 2 x daily - 7 x weekly - 2 sets - 10 reps - 5 hold  Access Code: [email or web address] URL: [email or web address] Date: 01/12/2024 Prepared by: Stacie Glaze  Exercises - Standing Elbow Flexion Extension AROM  - 2 x daily - 7 x weekly - 1 sets - 10 reps - 3 hold - Standing Forearm Pronation and Supination AROM  - 2 x daily - 7 x weekly - 1 sets - 10 reps - 3 hold - Thumb Opposition  - 2 x daily - 7 x weekly - 1 sets - 10 reps - 3 hold - Seated Shoulder Shrug Circles AROM Backward  - 2 x daily - 7 x weekly - 1 sets - 10 reps - 3 hold - Seated Scapular Retraction  - 2 x daily - 7 x weekly - 1 sets - 10 reps - 3 hold  ASSESSMENT:  CLINICAL IMPRESSION: The patients insurance authorized 6 visits for her reverse TSA.  Pt is ~ 6 weeks out initiated AAROM and isometric  interventions per protocol. Some discomfort at end rang with PROM. All interventions remained within protocol limitations. Protocol is in the accordion file.  Patient is a 73 y.o. female who was seen today for physical therapy evaluation and treatment for s/p right TSA on 01/07/24.  She had a fall in September and fractured the proximal humerus, she developed AVN and the bone was not healing.  The PA called me on Thursday and told me that she wanted [place] to be very careful and avoid any shoulder motions for the first 4 weeks, avoid even pendulums.  She reports that she is doing well in the sling and trying to not take much pain medication.  OBJECTIVE IMPAIRMENTS: cardiopulmonary status limiting activity, decreased activity tolerance, decreased coordination, decreased endurance, decreased ROM, decreased strength, increased edema, increased fascial restrictions, impaired perceived functional ability, increased muscle spasms, impaired flexibility, impaired UE functional use, impaired vision/preception, improper body mechanics, postural dysfunction, and pain.   REHAB POTENTIAL: Good  CLINICAL DECISION MAKING: Evolving/moderate complexity  EVALUATION COMPLEXITY: Moderate  GOALS: Goals reviewed with patient? Yes  SHORT TERM GOALS: Target date: 02/09/24  Independent with RICE Baseline: Goal status: met 02/09/24   LONG TERM GOALS: Target date: 04/10/24  Independent with HEP Baseline:  Goal status: INITIAL  2.  Decrease pain 50% Baseline: pain up to 7/10 at times Goal status: INITIAL  3.  Report able to dress without help Baseline: husband currently dresses her Goal status: INITIAL  4.  Be able to do hair without help Baseline: husband is doing her hair Goal status: INITIAL  5.  Increase AROM of the right shoulder flexion to 110 degrees Baseline: no motions Goal status: INITIAL  6.  Be able to reach head high cabinet with 3# Baseline: unable to lift arm Goal status:  INITIAL  PLAN:  PT FREQUENCY: 2x/week  PT DURATION: 12 weeks  PLANNED INTERVENTIONS: 97164- PT Re-evaluation, 97110-Therapeutic exercises, 97530- Therapeutic activity, 97112- Neuromuscular re-education, 97535- Self Care, 28413- Manual therapy, G0283- Electrical stimulation (unattended), 97016- Vasopneumatic device, Patient/Family education, Joint mobilization, Scar mobilization, and Cryotherapy  PLAN FOR NEXT SESSION: Follow MD protocol in accordion file   Grayce Sessions, PTA 02/19/2024, 8:49 AM

## 2024-02-20 ENCOUNTER — Encounter (INDEPENDENT_AMBULATORY_CARE_PROVIDER_SITE_OTHER): Payer: Self-pay | Admitting: Sports Medicine

## 2024-02-20 DIAGNOSIS — M8000XD Age-related osteoporosis with current pathological fracture, unspecified site, subsequent encounter for fracture with routine healing: Secondary | ICD-10-CM | POA: Diagnosis not present

## 2024-02-20 DIAGNOSIS — M8000XS Age-related osteoporosis with current pathological fracture, unspecified site, sequela: Secondary | ICD-10-CM

## 2024-02-20 NOTE — Telephone Encounter (Signed)
 Mychart sent by pt:  Karen Kays "Deb"  P [facility] (supporting Monica Becton, MD)17 minutes ago (10:31 AM)    Dr. Karie Schwalbe, if you decide to prescribe the shot, how do we proceed with Prolia?    Dr. Karie Schwalbe, please advise on this for pt.

## 2024-02-20 NOTE — Telephone Encounter (Signed)

## 2024-02-23 ENCOUNTER — Ambulatory Visit: Payer: Medicare Other | Admitting: Physical Therapy

## 2024-02-23 NOTE — Telephone Encounter (Addendum)
 Okay, patient requesting approval for Prolia.  Routing to Rx prior Auth team

## 2024-02-26 ENCOUNTER — Encounter: Payer: Self-pay | Admitting: Physical Therapy

## 2024-02-26 ENCOUNTER — Ambulatory Visit: Payer: Medicare Other | Admitting: Physical Therapy

## 2024-02-26 DIAGNOSIS — M25511 Pain in right shoulder: Secondary | ICD-10-CM | POA: Diagnosis not present

## 2024-02-26 DIAGNOSIS — Z96611 Presence of right artificial shoulder joint: Secondary | ICD-10-CM

## 2024-02-26 DIAGNOSIS — M25611 Stiffness of right shoulder, not elsewhere classified: Secondary | ICD-10-CM

## 2024-02-26 DIAGNOSIS — M6281 Muscle weakness (generalized): Secondary | ICD-10-CM

## 2024-02-26 NOTE — Therapy (Signed)
 OUTPATIENT PHYSICAL THERAPY SHOULDER TREATMENT   Patient Name: Melissa Mcdowell MRN: [id number] DOB:1950-11-19, 73 y.o., female Today's Date: 02/26/2024  END OF SESSION:  PT End of Session - 02/26/24 0846     Visit Number 4    Number of Visits 7    Date for PT Re-Evaluation 04/05/24    Authorization Type [employer]    PT Start Time 0845    PT Stop Time 0945    PT Time Calculation (min) 60 min    Activity Tolerance Patient tolerated treatment well    Behavior During Therapy WFL for tasks assessed/performed             Past Medical History:  Diagnosis Date   Acid reflux    occasional   Anemia    no current med.   Arthritis    lower back, fingers   Complication of anesthesia    states has a small mouth   Dental crowns present    Headache(784.0)    sinus - 3-4 x/month   Hypercholesteremia    Mucous cyst of finger 06/18/2012   left long finger   Past Surgical History:  Procedure Laterality Date   ABDOMINAL HYSTERECTOMY     complete   APPENDECTOMY     CATARACT EXTRACTION Bilateral    CESAREAN SECTION     ECTOPIC PREGNANCY SURGERY     MASS EXCISION  07/10/2012   Procedure: MINOR EXCISION OF MASS;  Surgeon: Wyn Forster., MD;  Location: [facility];  Service: Orthopedics;  Laterality: Left;   OOPHORECTOMY     REPAIR OF COMPLEX TRACTION RETINAL DETACHMENT Left 12/23/2018   Procedure: REPAIR OF  RETINAL DETACHMENT LEFT EYE;  Surgeon: Carmela Rima, MD;  Location: [facility];  Service: Ophthalmology;  Laterality: Left;   TOTAL HIP ARTHROPLASTY Right 08/12/2023   Procedure: TOTAL HIP ARTHROPLASTY ANTERIOR APPROACH;  Surgeon: Durene Romans, MD;  Location: [facility];  Service: Orthopedics;  Laterality: Right;   Patient Active Problem List   Diagnosis Date Noted   Pre-op evaluation 12/22/2023   Age related osteoporosis 11/07/2023   Low hemoglobin 08/20/2023   Post-menopausal 08/20/2023   S/P total right hip arthroplasty 08/12/2023   Closed right hip  fracture, initial encounter (HCC) 08/11/2023   Closed fracture of proximal end of right humerus 08/11/2023   Hyperglycemia 08/11/2023   Hypercalcemia 08/11/2023   Primary insomnia 05/26/2023   Elevated blood pressure reading 05/26/2023    PCP: Tamera Punt, DO  REFERRING PROVIDER: Everardo Pacific, MD  REFERRING DIAG: s/p right reverse TSA  THERAPY DIAG:  Acute pain of right shoulder  Stiffness of right shoulder, not elsewhere classified  Muscle weakness (generalized)  S/P reverse total shoulder arthroplasty, right  Rationale for Evaluation and Treatment: Rehabilitation  ONSET DATE: 01/07/24  SUBJECTIVE:  SUBJECTIVE STATEMENT:   02/26/24  Patient reports that she was outside yesterday for a ball game and reports that she got cold and is really sore last night. Week 7 of protocol  [employer] authorized 6 visits up front for this reverse TSA.  Patient is upset, reports that she is doing okay saw MD on Friday and we are at 4 weeks S/P surgery.  Patient had a fall 08/11/23. She sustained a right proximal humerus fracture.  She ended up with some AVN and the bone died, she underwent a right reverse TSA on 01/07/24.  She has been in sling, she reports that she is trying to not take as many narcotics.  Her PA called me last week and told me to avoid any shoulder motions for the first 4 weeks. Hand dominance: Right  PERTINENT HISTORY: THA  PAIN:  Are you having pain? Yes: NPRS scale: 1/10 Pain location: right shoulder Pain description: ache, sore Aggravating factors: as the day goes no, trying to not take pain meds pain 7/10 Relieving factors: ice, being in the sling and the pain meds, pain can be 0/10  PRECAUTIONS: Other: Reverse TSA PA called me and told me no ROM of the shoulder for the first 4 weeks, no pendulums  RED  FLAGS: None   WEIGHT BEARING RESTRICTIONS: No  FALLS:  Has patient fallen in last 6 months? No  LIVING ENVIRONMENT: Lives with: lives with their family Lives in: House/apartment Stairs: Yes: Internal: 12 steps; on right going up Has following equipment at home: None  OCCUPATION: retired  PLOF: Independent and does yardwork, housework  PATIENT GOALS:have better ROM, less pain, get dressed  NEXT MD VISIT:   OBJECTIVE:  Note: Objective measures were completed at Evaluation unless otherwise noted.  DIAGNOSTIC FINDINGS:  none  PATIENT SURVEYS:  Quick Dash 84%  COGNITION: Overall cognitive status: Within functional limits for tasks assessed     SENSATION: WFL  POSTURE: Fwd head, rounded shoulders, right shoulder elevated some  UPPER EXTREMITY ROM:   Passive ROM Right eval Left eval PROM  Right 02/09/24 AAROM Right 02/26/23  Shoulder flexion   80 110  Shoulder extension      Shoulder abduction   70 95  Shoulder adduction      Shoulder internal rotation      Shoulder external rotation   20   Elbow flexion      Elbow extension      Wrist flexion      Wrist extension      Wrist ulnar deviation      Wrist radial deviation      Wrist pronation      Wrist supination      (Blank rows = not tested)  UPPER EXTREMITY MMT:  MMT Right eval Left eval  Shoulder flexion    Shoulder extension    Shoulder abduction    Shoulder adduction    Shoulder internal rotation    Shoulder external rotation    Middle trapezius    Lower trapezius    Elbow flexion    Elbow extension    Wrist flexion    Wrist extension    Wrist ulnar deviation    Wrist radial deviation    Wrist pronation    Wrist supination    Grip strength (lbs)    (Blank rows = not tested)  JOINT MOBILITY TESTING:  Not tested  PALPATION:  Tightness in the upper trap, tight in the  TREATMENT DATE:  02/26/24 UBE level 2 x 6 minutes Wall slides and circles limited ROM for pain control, AAROM with the left hand helping Red tband rows to neutral Red tband extension to neutral 4# hammer curl 2# biceps curl Gentle hand on ball vs wall small circles in front and in scapular plane Supine punches from neutral, very difficult, did varying angles Supine isometric circles Supine isometric abduction and adduction Added to HEP and performed see below PROM  Right shoulder vaso medium pressure 34 degrees  02/19/24 AAROM Flex, Ext, IR up back 1lb WaTE  Rows & Ext yellow 2x10 RUE isometrics 3''x5 flex, ext, IR and ER RUE PROM with end range holds within protocol limitations  RUE GH Jt mobs grade 1 Vaso RUE low pressure 34 deg x 10 min.  02/09/24 HEP below, educated patient performed with her PROM of the right shoulder with her supine, education of the spouse in the same and went over protocol and the ROM limitations  01/12/24 Evaluation   PATIENT EDUCATION: Education details: HEP/POC Person educated: Patient and Spouse Education method: Programmer, multimedia, Facilities manager, Verbal cues, and Handouts Education comprehension: verbalized understanding  HOME EXERCISE PROGRAM: Access Code: [email or web address] URL: [email or web address] Date: 02/09/2024 Prepared by: Stacie Glaze  Exercises - Supine Shoulder Flexion AAROM with Hands Clasped  - 2 x daily - 7 x weekly - 2 sets - 10 reps - 5 hold - Seated Shoulder External Rotation AAROM with Cane and Hand in Neutral  - 2 x daily - 7 x weekly - 2 sets - 10 reps - 5 hold - Seated Shoulder Abduction AAROM with Dowel  - 2 x daily - 7 x weekly - 2 sets - 10 reps - 5 hold - Seated Elbow Flexion Shoulder Internal Rotation AAROM at Table with Towel  - 2 x daily - 7 x weekly - 2 sets - 10 reps - 5 hold - Seated Shoulder Flexion Towel Slide at Table Top  - 2 x daily - 7 x weekly - 2 sets - 10 reps - 5 hold  Access Code:  [email or web address] URL: [email or web address] Date: 01/12/2024 Prepared by: Stacie Glaze  Exercises - Standing Elbow Flexion Extension AROM  - 2 x daily - 7 x weekly - 1 sets - 10 reps - 3 hold - Standing Forearm Pronation and Supination AROM  - 2 x daily - 7 x weekly - 1 sets - 10 reps - 3 hold - Thumb Opposition  - 2 x daily - 7 x weekly - 1 sets - 10 reps - 3 hold - Seated Shoulder Shrug Circles AROM Backward  - 2 x daily - 7 x weekly - 1 sets - 10 reps - 3 hold - Seated Scapular Retraction  - 2 x daily - 7 x weekly - 1 sets - 10 reps - 3 hold Access Code: [email or web address] URL: [email or web address] Date: 02/26/2024 Prepared by: Stacie Glaze  Exercises - Standing Row with Anchored Resistance  - 1 x daily - 7 x weekly - 3 sets - 10 reps - 3 hold - Shoulder extension with resistance - Neutral  - 1 x daily - 7 x weekly - 3 sets - 10 reps - 3 hold - Supine Shoulder Flexion AAROM with Hands Clasped  - 1 x daily - 7 x weekly - 3 sets - 10 reps - 3 hold - Supine Shoulder Alphabet  - 1 x daily - 7 x weekly - 3 sets - 10 reps - 3 hold ASSESSMENT:  CLINICAL IMPRESSION: We are on  week 7 of the protocol, I advanced HEP and advanced exercises in the gym, some cues to assure following protocol and to decrease the compensations.  All interventions remained within protocol limitations. Protocol is in the accordion file.  Patient is a 73 y.o. female who was seen today for physical therapy evaluation and treatment for s/p right TSA on 01/07/24.  She had a fall in September and fractured the proximal humerus, she developed AVN and the bone was not healing.  The PA called me on Thursday and told me that she wanted [place] to be very careful and avoid any shoulder motions for the first 4 weeks, avoid even pendulums.  She reports that she is doing well in the sling and trying to not take much pain medication.  OBJECTIVE IMPAIRMENTS: cardiopulmonary status limiting activity, decreased activity  tolerance, decreased coordination, decreased endurance, decreased ROM, decreased strength, increased edema, increased fascial restrictions, impaired perceived functional ability, increased muscle spasms, impaired flexibility, impaired UE functional use, impaired vision/preception, improper body mechanics, postural dysfunction, and pain.   REHAB POTENTIAL: Good  CLINICAL DECISION MAKING: Evolving/moderate complexity  EVALUATION COMPLEXITY: Moderate   GOALS: Goals reviewed with patient? Yes  SHORT TERM GOALS: Target date: 02/09/24  Independent with RICE Baseline: Goal status: met 02/09/24   LONG TERM GOALS: Target date: 04/10/24  Independent with HEP Baseline:  Goal status: ongoing 02/26/24  2.  Decrease pain 50% Baseline: pain up to 7/10 at times Goal status: INITIAL  3.  Report able to dress without help Baseline: husband currently dresses her Goal status: progressing 02/26/24  4.  Be able to do hair without help Baseline: husband is doing her hair Goal status: INITIAL  5.  Increase AROM of the right shoulder flexion to 110 degrees Baseline: no motions Goal status: INITIAL  6.  Be able to reach head high cabinet with 3# Baseline: unable to lift arm Goal status: INITIAL  PLAN:  PT FREQUENCY: 2x/week  PT DURATION: 12 weeks  PLANNED INTERVENTIONS: 97164- PT Re-evaluation, 97110-Therapeutic exercises, 97530- Therapeutic activity, 97112- Neuromuscular re-education, 97535- Self Care, 09811- Manual therapy, G0283- Electrical stimulation (unattended), 97016- Vasopneumatic device, Patient/Family education, Joint mobilization, Scar mobilization, and Cryotherapy  PLAN FOR NEXT SESSION: Follow MD protocol in accordion file  Week 7 now, week 8 next week   Merit Maybee W, PT 02/26/2024, 9:30 AM

## 2024-02-27 ENCOUNTER — Telehealth: Payer: Self-pay

## 2024-02-27 ENCOUNTER — Encounter: Payer: Self-pay | Admitting: Sports Medicine

## 2024-02-27 NOTE — Telephone Encounter (Signed)
 Prolia BIV in separate encounter. Will route encounter back once benefit verification is complete.

## 2024-02-27 NOTE — Telephone Encounter (Signed)
 Prolia VOB initiated via [email or web address]

## 2024-03-01 ENCOUNTER — Other Ambulatory Visit (HOSPITAL_COMMUNITY): Payer: Self-pay

## 2024-03-01 ENCOUNTER — Ambulatory Visit: Payer: Medicare Other | Admitting: Physical Therapy

## 2024-03-03 ENCOUNTER — Other Ambulatory Visit (HOSPITAL_COMMUNITY): Payer: Self-pay

## 2024-03-03 NOTE — Telephone Encounter (Signed)
 Melissa Mcdowell

## 2024-03-03 NOTE — Telephone Encounter (Signed)
 Prior Authorization form/request asks a question that requires your assistance. Please see the question below and advise accordingly. The PA will not be submitted until the necessary information is received.       I did not find any history of either oral or IV bisphosphate. Insurance requires failure of BOTH. Patient can get Prolia through pharmacy benefits but her copay is $847.40. After that we can submit PA as continuation of therapy and bypass the requirement of failing both bisphosphates.

## 2024-03-03 NOTE — Telephone Encounter (Signed)
 Routing to the covering provider. Please review the previous telephone encounter from the RX auth team.

## 2024-03-04 ENCOUNTER — Encounter: Payer: Self-pay | Admitting: Physical Therapy

## 2024-03-04 ENCOUNTER — Ambulatory Visit: Payer: Medicare Other | Admitting: Physical Therapy

## 2024-03-04 DIAGNOSIS — M25511 Pain in right shoulder: Secondary | ICD-10-CM

## 2024-03-04 DIAGNOSIS — M25611 Stiffness of right shoulder, not elsewhere classified: Secondary | ICD-10-CM

## 2024-03-04 DIAGNOSIS — Z96611 Presence of right artificial shoulder joint: Secondary | ICD-10-CM

## 2024-03-04 DIAGNOSIS — M6281 Muscle weakness (generalized): Secondary | ICD-10-CM

## 2024-03-04 NOTE — Telephone Encounter (Signed)
 Per the patient, she has no history of taking any medication for bone strengthening. Patient is okay with trying Alendronate. Please send the new rx to [facility] located in [place].

## 2024-03-04 NOTE — Therapy (Signed)
 OUTPATIENT PHYSICAL THERAPY SHOULDER TREATMENT   Patient Name: DELONDA COLEY MRN: [id number] DOB:May 22, 1951, 73 y.o., female Today's Date: 03/04/2024  END OF SESSION:  PT End of Session - 03/04/24 0844     Visit Number 5    Date for PT Re-Evaluation 04/05/24    PT Start Time 0845    PT Stop Time 0930    PT Time Calculation (min) 45 min    Activity Tolerance Patient tolerated treatment well    Behavior During Therapy [facility] for tasks assessed/performed             Past Medical History:  Diagnosis Date   Acid reflux    occasional   Anemia    no current med.   Arthritis    lower back, fingers   Complication of anesthesia    states has a small mouth   Dental crowns present    Headache(784.0)    sinus - 3-4 x/month   Hypercholesteremia    Mucous cyst of finger 06/18/2012   left long finger   Past Surgical History:  Procedure Laterality Date   ABDOMINAL HYSTERECTOMY     complete   APPENDECTOMY     CATARACT EXTRACTION Bilateral    CESAREAN SECTION     ECTOPIC PREGNANCY SURGERY     MASS EXCISION  07/10/2012   Procedure: MINOR EXCISION OF MASS;  Surgeon: Amelie Baize., MD;  Location: [facility];  Service: Orthopedics;  Laterality: Left;   OOPHORECTOMY     REPAIR OF COMPLEX TRACTION RETINAL DETACHMENT Left 12/23/2018   Procedure: REPAIR OF  RETINAL DETACHMENT LEFT EYE;  Surgeon: Jearline Minder, MD;  Location: [facility] OR;  Service: Ophthalmology;  Laterality: Left;   TOTAL HIP ARTHROPLASTY Right 08/12/2023   Procedure: TOTAL HIP ARTHROPLASTY ANTERIOR APPROACH;  Surgeon: Claiborne Crew, MD;  Location: [facility];  Service: Orthopedics;  Laterality: Right;   Patient Active Problem List   Diagnosis Date Noted   Pre-op evaluation 12/22/2023   Age related osteoporosis 11/07/2023   Low hemoglobin 08/20/2023   Post-menopausal 08/20/2023   S/P total right hip arthroplasty 08/12/2023   Closed right hip fracture, initial encounter (HCC) 08/11/2023   Closed  fracture of proximal end of right humerus 08/11/2023   Hyperglycemia 08/11/2023   Hypercalcemia 08/11/2023   Primary insomnia 05/26/2023   Elevated blood pressure reading 05/26/2023    PCP: Dianah Fort, DO  REFERRING PROVIDER: Yvonne Hering, MD  REFERRING DIAG: s/p right reverse TSA  THERAPY DIAG:  Acute pain of right shoulder  Stiffness of right shoulder, not elsewhere classified  Muscle weakness (generalized)  S/P reverse total shoulder arthroplasty, right  Rationale for Evaluation and Treatment: Rehabilitation  ONSET DATE: 01/07/24  SUBJECTIVE:  SUBJECTIVE STATEMENT:   03/04/24 Pretty good kind of sore for some reason  [facility] authorized 6 visits up front for this reverse TSA.  Patient is upset, reports that she is doing okay saw MD on Friday and we are at 4 weeks S/P surgery.  Patient had a fall 08/11/23. She sustained a right proximal humerus fracture.  She ended up with some AVN and the bone died, she underwent a right reverse TSA on 01/07/24.  She has been in sling, she reports that she is trying to not take as many narcotics.  Her PA called me last week and told me to avoid any shoulder motions for the first 4 weeks. Hand dominance: Right  PERTINENT HISTORY: THA  PAIN:  Are you having pain? Yes: NPRS scale: 1/10 Pain location: right shoulder Pain description: ache, sore Aggravating factors: as the day goes no, trying to not take pain meds pain 7/10 Relieving factors: ice, being in the sling and the pain meds, pain can be 0/10  PRECAUTIONS: Other: Reverse TSA PA called me and told me no ROM of the shoulder for the first 4 weeks, no pendulums  RED FLAGS: None   WEIGHT BEARING RESTRICTIONS: No  FALLS:  Has patient fallen in last 6 months? No  LIVING ENVIRONMENT: Lives with: lives with their  family Lives in: House/apartment Stairs: Yes: Internal: 12 steps; on right going up Has following equipment at home: None  OCCUPATION: retired  PLOF: Independent and does yardwork, housework  PATIENT GOALS:have better ROM, less pain, get dressed  NEXT MD VISIT:   OBJECTIVE:  Note: Objective measures were completed at Evaluation unless otherwise noted.  DIAGNOSTIC FINDINGS:  none  PATIENT SURVEYS:  Quick Dash 84%  COGNITION: Overall cognitive status: Within functional limits for tasks assessed     SENSATION: WFL  POSTURE: Fwd head, rounded shoulders, right shoulder elevated some  UPPER EXTREMITY ROM:   Passive ROM Right eval Left eval PROM  Right 02/09/24 AAROM Right 02/26/23 AROM Right 03/04/24  Shoulder flexion   80 110 82  Shoulder extension       Shoulder abduction   70 95 74  Shoulder adduction       Shoulder internal rotation       Shoulder external rotation   20    Elbow flexion       Elbow extension       Wrist flexion       Wrist extension       Wrist ulnar deviation       Wrist radial deviation       Wrist pronation       Wrist supination       (Blank rows = not tested)  UPPER EXTREMITY MMT:  MMT Right eval Left eval  Shoulder flexion    Shoulder extension    Shoulder abduction    Shoulder adduction    Shoulder internal rotation    Shoulder external rotation    Middle trapezius    Lower trapezius    Elbow flexion    Elbow extension    Wrist flexion    Wrist extension    Wrist ulnar deviation    Wrist radial deviation    Wrist pronation    Wrist supination    Grip strength (lbs)    (Blank rows = not tested)  JOINT MOBILITY TESTING:  Not tested  PALPATION:  Tightness in the upper trap, tight in the  TREATMENT DATE:  03/04/24 UBE L2 x 3 min each AAROM Flex & abs w/ dowel 2x10 Red tband rows to  neutral Red tband extension to neutral Yellow Tband RUE ER 2x10 Scaption 2x10 4# hammer curl 2# biceps curl PROM  Right shoulder vaso medium pressure 34 degrees  02/26/24 UBE level 2 x 6 minutes Wall slides and circles limited ROM for pain control, AAROM with the left hand helping Red tband rows to neutral Red tband extension to neutral 4# hammer curl 2# biceps curl Gentle hand on ball vs wall small circles in front and in scapular plane Supine punches from neutral, very difficult, did varying angles Supine isometric circles Supine isometric abduction and adduction Added to HEP and performed see below PROM  Right shoulder vaso medium pressure 34 degrees  02/19/24 AAROM Flex, Ext, IR up back 1lb WaTE  Rows & Ext yellow 2x10 RUE isometrics 3''x5 flex, ext, IR and ER RUE PROM with end range holds within protocol limitations  RUE GH Jt mobs grade 1 Vaso RUE low pressure 34 deg x 10 min.  02/09/24 HEP below, educated patient performed with her PROM of the right shoulder with her supine, education of the spouse in the same and went over protocol and the ROM limitations  01/12/24 Evaluation   PATIENT EDUCATION: Education details: HEP/POC Person educated: Patient and Spouse Education method: Programmer, multimedia, Facilities manager, Verbal cues, and Handouts Education comprehension: verbalized understanding  HOME EXERCISE PROGRAM: Access Code: [email or web address] URL: [email or web address] Date: 02/09/2024 Prepared by: Cherylene Corrente  Exercises - Supine Shoulder Flexion AAROM with Hands Clasped  - 2 x daily - 7 x weekly - 2 sets - 10 reps - 5 hold - Seated Shoulder External Rotation AAROM with Cane and Hand in Neutral  - 2 x daily - 7 x weekly - 2 sets - 10 reps - 5 hold - Seated Shoulder Abduction AAROM with Dowel  - 2 x daily - 7 x weekly - 2 sets - 10 reps - 5 hold - Seated Elbow Flexion Shoulder Internal Rotation AAROM at Table with Towel  - 2 x daily - 7 x weekly - 2 sets - 10  reps - 5 hold - Seated Shoulder Flexion Towel Slide at Table Top  - 2 x daily - 7 x weekly - 2 sets - 10 reps - 5 hold  Access Code: [email or web address] URL: [email or web address] Date: 01/12/2024 Prepared by: Cherylene Corrente  Exercises - Standing Elbow Flexion Extension AROM  - 2 x daily - 7 x weekly - 1 sets - 10 reps - 3 hold - Standing Forearm Pronation and Supination AROM  - 2 x daily - 7 x weekly - 1 sets - 10 reps - 3 hold - Thumb Opposition  - 2 x daily - 7 x weekly - 1 sets - 10 reps - 3 hold - Seated Shoulder Shrug Circles AROM Backward  - 2 x daily - 7 x weekly - 1 sets - 10 reps - 3 hold - Seated Scapular Retraction  - 2 x daily - 7 x weekly - 1 sets - 10 reps - 3 hold Access Code: [email or web address] URL: [email or web address] Date: 02/26/2024 Prepared by: Cherylene Corrente  Exercises - Standing Row with Anchored Resistance  - 1 x daily - 7 x weekly - 3 sets - 10 reps - 3 hold - Shoulder extension with resistance - Neutral  - 1 x daily - 7 x weekly - 3 sets - 10 reps - 3 hold - Supine Shoulder Flexion AAROM with Hands  Clasped  - 1 x daily - 7 x weekly - 3 sets - 10 reps - 3 hold - Supine Shoulder Alphabet  - 1 x daily - 7 x weekly - 3 sets - 10 reps - 3 hold ASSESSMENT:  CLINICAL IMPRESSION: We are on week 8 of the protocol, Advanced to more AROM some cues to assure following protocol and to decrease the compensations.  All interventions remained within protocol limitations. Pt voiced concern about soreness because she knew that her rotator cuff muscle is still torn, and may need another surgical procedure. Protocol is in the accordion file.  Patient is a 73 y.o. female who was seen today for physical therapy evaluation and treatment for s/p right TSA on 01/07/24.  She had a fall in September and fractured the proximal humerus, she developed AVN and the bone was not healing.  The PA called me on Thursday and told me that she wanted us  to be very careful and avoid any  shoulder motions for the first 4 weeks, avoid even pendulums.  She reports that she is doing well in the sling and trying to not take much pain medication.  OBJECTIVE IMPAIRMENTS: cardiopulmonary status limiting activity, decreased activity tolerance, decreased coordination, decreased endurance, decreased ROM, decreased strength, increased edema, increased fascial restrictions, impaired perceived functional ability, increased muscle spasms, impaired flexibility, impaired UE functional use, impaired vision/preception, improper body mechanics, postural dysfunction, and pain.   REHAB POTENTIAL: Good  CLINICAL DECISION MAKING: Evolving/moderate complexity  EVALUATION COMPLEXITY: Moderate   GOALS: Goals reviewed with patient? Yes  SHORT TERM GOALS: Target date: 02/09/24  Independent with RICE Baseline: Goal status: met 02/09/24   LONG TERM GOALS: Target date: 04/10/24  Independent with HEP Baseline:  Goal status: ongoing 02/26/24  2.  Decrease pain 50% Baseline: pain up to 7/10 at times Goal status: INITIAL  3.  Report able to dress without help Baseline: husband currently dresses her Goal status: progressing 02/26/24  4.  Be able to do hair without help Baseline: husband is doing her hair Goal status: INITIAL  5.  Increase AROM of the right shoulder flexion to 110 degrees Baseline: no motions Goal status: INITIAL  6.  Be able to reach head high cabinet with 3# Baseline: unable to lift arm Goal status: INITIAL  PLAN:  PT FREQUENCY: 2x/week  PT DURATION: 12 weeks  PLANNED INTERVENTIONS: 97164- PT Re-evaluation, 97110-Therapeutic exercises, 97530- Therapeutic activity, 97112- Neuromuscular re-education, 97535- Self Care, 16109- Manual therapy, G0283- Electrical stimulation (unattended), 97016- Vasopneumatic device, Patient/Family education, Joint mobilization, Scar mobilization, and Cryotherapy  PLAN FOR NEXT SESSION: Follow MD protocol in accordion file  Week 8 now, week 9  next week   Ollen Beverage, PTA 03/04/2024, 8:45 AM

## 2024-03-08 ENCOUNTER — Ambulatory Visit: Payer: Medicare Other | Admitting: Physical Therapy

## 2024-03-09 ENCOUNTER — Encounter: Payer: Self-pay | Admitting: Family Medicine

## 2024-03-09 MED ORDER — ALENDRONATE SODIUM 70 MG PO TABS: 70.0000 mg | ORAL_TABLET | ORAL | 11 refills | Status: DC

## 2024-03-09 NOTE — Addendum Note (Signed)
 Addended by: Araceli Knight on: 03/09/2024 04:19 PM   Modules accepted: Orders

## 2024-03-11 ENCOUNTER — Encounter: Payer: Self-pay | Admitting: Physical Therapy

## 2024-03-11 ENCOUNTER — Ambulatory Visit: Payer: Medicare Other | Admitting: Physical Therapy

## 2024-03-11 DIAGNOSIS — M25511 Pain in right shoulder: Secondary | ICD-10-CM | POA: Diagnosis not present

## 2024-03-11 DIAGNOSIS — M6281 Muscle weakness (generalized): Secondary | ICD-10-CM

## 2024-03-11 DIAGNOSIS — M25611 Stiffness of right shoulder, not elsewhere classified: Secondary | ICD-10-CM

## 2024-03-11 DIAGNOSIS — Z96611 Presence of right artificial shoulder joint: Secondary | ICD-10-CM

## 2024-03-11 NOTE — Telephone Encounter (Signed)
 Task completed. Patient has been updated of the new rx. Patient was informed of how to take the medication. Verbalized understanding. Patient is aware to contact the clinic if she experiences any side effects from the medication.

## 2024-03-11 NOTE — Therapy (Signed)
 OUTPATIENT PHYSICAL THERAPY SHOULDER TREATMENT   Patient Name: Melissa Mcdowell MRN: [id number] DOB:Aug 21, 1951, 73 y.o., female Today's Date: 03/11/2024  END OF SESSION:  PT End of Session - 03/11/24 0844     Visit Number 6    Number of Visits 7    Date for PT Re-Evaluation 04/05/24    Authorization Type [employer]    PT Start Time [id number]    PT Stop Time 0930    PT Time Calculation (min) 46 min    Activity Tolerance Patient tolerated treatment well    Behavior During Therapy WFL for tasks assessed/performed             Past Medical History:  Diagnosis Date   Acid reflux    occasional   Anemia    no current med.   Arthritis    lower back, fingers   Complication of anesthesia    states has a small mouth   Dental crowns present    Headache(784.0)    sinus - 3-4 x/month   Hypercholesteremia    Mucous cyst of finger 06/18/2012   left long finger   Past Surgical History:  Procedure Laterality Date   ABDOMINAL HYSTERECTOMY     complete   APPENDECTOMY     CATARACT EXTRACTION Bilateral    CESAREAN SECTION     ECTOPIC PREGNANCY SURGERY     MASS EXCISION  07/10/2012   Procedure: MINOR EXCISION OF MASS;  Surgeon: Amelie Baize., MD;  Location: [facility];  Service: Orthopedics;  Laterality: Left;   OOPHORECTOMY     REPAIR OF COMPLEX TRACTION RETINAL DETACHMENT Left 12/23/2018   Procedure: REPAIR OF  RETINAL DETACHMENT LEFT EYE;  Surgeon: Jearline Minder, MD;  Location: [facility] OR;  Service: Ophthalmology;  Laterality: Left;   TOTAL HIP ARTHROPLASTY Right 08/12/2023   Procedure: TOTAL HIP ARTHROPLASTY ANTERIOR APPROACH;  Surgeon: Claiborne Crew, MD;  Location: [facility];  Service: Orthopedics;  Laterality: Right;   Patient Active Problem List   Diagnosis Date Noted   Pre-op evaluation 12/22/2023   Age related osteoporosis 11/07/2023   Low hemoglobin 08/20/2023   Post-menopausal 08/20/2023   S/P total right hip arthroplasty 08/12/2023   Closed right hip  fracture, initial encounter (HCC) 08/11/2023   Closed fracture of proximal end of right humerus 08/11/2023   Hyperglycemia 08/11/2023   Hypercalcemia 08/11/2023   Primary insomnia 05/26/2023   Elevated blood pressure reading 05/26/2023    PCP: Dianah Fort, DO  REFERRING PROVIDER: Yvonne Hering, MD  REFERRING DIAG: s/p right reverse TSA  THERAPY DIAG:  Acute pain of right shoulder  Stiffness of right shoulder, not elsewhere classified  Muscle weakness (generalized)  S/P reverse total shoulder arthroplasty, right  Rationale for Evaluation and Treatment: Rehabilitation  ONSET DATE: 01/07/24  SUBJECTIVE:  SUBJECTIVE STATEMENT:   03/11/24  Patient reports that she is just a little sore, sore in the upper trap and the posterior arm rates pain at rest a 2-3/10  [facility] authorized 6 visits up front for this reverse TSA.  Patient is upset, reports that she is doing okay saw MD on Friday and we are at 4 weeks S/P surgery.  Patient had a fall 08/11/23. She sustained a right proximal humerus fracture.  She ended up with some AVN and the bone died, she underwent a right reverse TSA on 01/07/24.  She has been in sling, she reports that she is trying to not take as many narcotics.  Her PA called me last week and told me to avoid any shoulder motions for the first 4 weeks. Hand dominance: Right  PERTINENT HISTORY: THA  PAIN:  Are you having pain? Yes: NPRS scale: 1/10 Pain location: right shoulder Pain description: ache, sore Aggravating factors: as the day goes no, trying to not take pain meds pain 7/10 Relieving factors: ice, being in the sling and the pain meds, pain can be 0/10  PRECAUTIONS: Other: Reverse TSA PA called me and told me no ROM of the shoulder for the first 4 weeks, no pendulums  RED FLAGS: None   WEIGHT  BEARING RESTRICTIONS: No  FALLS:  Has patient fallen in last 6 months? No  LIVING ENVIRONMENT: Lives with: lives with their family Lives in: House/apartment Stairs: Yes: Internal: 12 steps; on right going up Has following equipment at home: None  OCCUPATION: retired  PLOF: Independent and does yardwork, housework  PATIENT GOALS:have better ROM, less pain, get dressed  NEXT MD VISIT:   OBJECTIVE:  Note: Objective measures were completed at Evaluation unless otherwise noted.  DIAGNOSTIC FINDINGS:  none  PATIENT SURVEYS:  Quick Dash 84%  COGNITION: Overall cognitive status: Within functional limits for tasks assessed     SENSATION: WFL  POSTURE: Fwd head, rounded shoulders, right shoulder elevated some  UPPER EXTREMITY ROM:   Passive ROM Right eval Left eval PROM  Right 02/09/24 AAROM Right 02/26/23 AROM Right 03/04/24  Shoulder flexion   80 110 82  Shoulder extension       Shoulder abduction   70 95 74  Shoulder adduction       Shoulder internal rotation       Shoulder external rotation   20    Elbow flexion       Elbow extension       Wrist flexion       Wrist extension       Wrist ulnar deviation       Wrist radial deviation       Wrist pronation       Wrist supination       (Blank rows = not tested)  UPPER EXTREMITY MMT:  MMT Right eval Left eval  Shoulder flexion    Shoulder extension    Shoulder abduction    Shoulder adduction    Shoulder internal rotation    Shoulder external rotation    Middle trapezius    Lower trapezius    Elbow flexion    Elbow extension    Wrist flexion    Wrist extension    Wrist ulnar deviation    Wrist radial deviation    Wrist pronation    Wrist supination    Grip strength (lbs)    (Blank rows = not tested)  JOINT MOBILITY TESTING:  Not tested  PALPATION:  Tightness in the  upper trap, tight in the                                                                                                                               TREATMENT DATE:  03/11/24 UBE level 2 x 6 minutes Red tband row to neutral  Red tband extension to neutral Wall slides and circles 3# wate bar biceps Supine dowel chest press and flexion PROM all motions allowed in protocol Star gazer stretch 1# ER in supine 1# punches supine with PT limiting ROM 2# serratus  03/04/24 UBE L2 x 3 min each AAROM Flex & abs w/ dowel 2x10 Red tband rows to neutral Red tband extension to neutral Yellow Tband RUE ER 2x10 Scaption 2x10 4# hammer curl 2# biceps curl PROM  Right shoulder vaso medium pressure 34 degrees  02/26/24 UBE level 2 x 6 minutes Wall slides and circles limited ROM for pain control, AAROM with the left hand helping Red tband rows to neutral Red tband extension to neutral 4# hammer curl 2# biceps curl Gentle hand on ball vs wall small circles in front and in scapular plane Supine punches from neutral, very difficult, did varying angles Supine isometric circles Supine isometric abduction and adduction Added to HEP and performed see below PROM  Right shoulder vaso medium pressure 34 degrees  02/19/24 AAROM Flex, Ext, IR up back 1lb WaTE  Rows & Ext yellow 2x10 RUE isometrics 3''x5 flex, ext, IR and ER RUE PROM with end range holds within protocol limitations  RUE GH Jt mobs grade 1 Vaso RUE low pressure 34 deg x 10 min.  02/09/24 HEP below, educated patient performed with her PROM of the right shoulder with her supine, education of the spouse in the same and went over protocol and the ROM limitations  01/12/24 Evaluation   PATIENT EDUCATION: Education details: HEP/POC Person educated: Patient and Spouse Education method: Programmer, multimedia, Facilities manager, Verbal cues, and Handouts Education comprehension: verbalized understanding  HOME EXERCISE PROGRAM: Access Code: [email or web address] URL: [email or web address] Date: 02/09/2024 Prepared by: Cherylene Corrente  Exercises - Supine Shoulder  Flexion AAROM with Hands Clasped  - 2 x daily - 7 x weekly - 2 sets - 10 reps - 5 hold - Seated Shoulder External Rotation AAROM with Cane and Hand in Neutral  - 2 x daily - 7 x weekly - 2 sets - 10 reps - 5 hold - Seated Shoulder Abduction AAROM with Dowel  - 2 x daily - 7 x weekly - 2 sets - 10 reps - 5 hold - Seated Elbow Flexion Shoulder Internal Rotation AAROM at Table with Towel  - 2 x daily - 7 x weekly - 2 sets - 10 reps - 5 hold - Seated Shoulder Flexion Towel Slide at Table Top  - 2 x daily - 7 x weekly - 2 sets - 10 reps - 5 hold  Access Code: [email or web address] URL: [email or web address] Date: 01/12/2024 Prepared by: Cherylene Corrente  Exercises -  Standing Elbow Flexion Extension AROM  - 2 x daily - 7 x weekly - 1 sets - 10 reps - 3 hold - Standing Forearm Pronation and Supination AROM  - 2 x daily - 7 x weekly - 1 sets - 10 reps - 3 hold - Thumb Opposition  - 2 x daily - 7 x weekly - 1 sets - 10 reps - 3 hold - Seated Shoulder Shrug Circles AROM Backward  - 2 x daily - 7 x weekly - 1 sets - 10 reps - 3 hold - Seated Scapular Retraction  - 2 x daily - 7 x weekly - 1 sets - 10 reps - 3 hold Access Code: [email or web address] URL: [email or web address] Date: 02/26/2024 Prepared by: Cherylene Corrente  Exercises - Standing Row with Anchored Resistance  - 1 x daily - 7 x weekly - 3 sets - 10 reps - 3 hold - Shoulder extension with resistance - Neutral  - 1 x daily - 7 x weekly - 3 sets - 10 reps - 3 hold - Supine Shoulder Flexion AAROM with Hands Clasped  - 1 x daily - 7 x weekly - 3 sets - 10 reps - 3 hold - Supine Shoulder Alphabet  - 1 x daily - 7 x weekly - 3 sets - 10 reps - 3 hold ASSESSMENT:  CLINICAL IMPRESSION: I continue to follow protocol trying to push the end ROM as I can to her tolerance.  She is doing well but still hesitant and painful with ER.   All interventions remained within protocol limitations. Pt voiced concern about soreness because she knew that her  rotator cuff muscle is still torn, and may need another surgical procedure. Protocol is in the accordion file.  Patient is a 73 y.o. female who was seen today for physical therapy evaluation and treatment for s/p right TSA on 01/07/24.  She had a fall in September and fractured the proximal humerus, she developed AVN and the bone was not healing.  The PA called me on Thursday and told me that she wanted us  to be very careful and avoid any shoulder motions for the first 4 weeks, avoid even pendulums.  She reports that she is doing well in the sling and trying to not take much pain medication.  OBJECTIVE IMPAIRMENTS: cardiopulmonary status limiting activity, decreased activity tolerance, decreased coordination, decreased endurance, decreased ROM, decreased strength, increased edema, increased fascial restrictions, impaired perceived functional ability, increased muscle spasms, impaired flexibility, impaired UE functional use, impaired vision/preception, improper body mechanics, postural dysfunction, and pain.   REHAB POTENTIAL: Good  CLINICAL DECISION MAKING: Evolving/moderate complexity  EVALUATION COMPLEXITY: Moderate   GOALS: Goals reviewed with patient? Yes  SHORT TERM GOALS: Target date: 02/09/24  Independent with RICE Baseline: Goal status: met 02/09/24   LONG TERM GOALS: Target date: 04/10/24  Independent with HEP Baseline:  Goal status: ongoing 02/26/24  2.  Decrease pain 50% Baseline: pain up to 7/10 at times Goal status: progressing 03/11/24  3.  Report able to dress without help Baseline: husband currently dresses her Goal status: progressing 02/26/24  4.  Be able to do hair without help Baseline: husband is doing her hair Goal status: progressing 03/11/24  5.  Increase AROM of the right shoulder flexion to 110 degrees Baseline: no motions Goal status: INITIAL  6.  Be able to reach head high cabinet with 3# Baseline: unable to lift arm Goal status:  INITIAL  PLAN:  PT FREQUENCY: 2x/week  PT DURATION: 12 weeks  PLANNED  INTERVENTIONS: 97164- PT Re-evaluation, 97110-Therapeutic exercises, 97530- Therapeutic activity, 97112- Neuromuscular re-education, 97535- Self Care, 21308- Manual therapy, G0283- Electrical stimulation (unattended), 97016- Vasopneumatic device, Patient/Family education, Joint mobilization, Scar mobilization, and Cryotherapy  PLAN FOR NEXT SESSION: Follow MD protocol in accordion file  next week will measure and assess and request more visits from the insurance   Shawnn Bouillon W, PT 03/11/2024, 8:44 AM

## 2024-03-15 ENCOUNTER — Ambulatory Visit: Payer: Medicare Other | Admitting: Physical Therapy

## 2024-03-17 NOTE — Telephone Encounter (Signed)
 Patient has been prescribed alendronate .

## 2024-03-18 ENCOUNTER — Ambulatory Visit: Payer: Medicare Other | Attending: Orthopaedic Surgery | Admitting: Physical Therapy

## 2024-03-18 DIAGNOSIS — M25611 Stiffness of right shoulder, not elsewhere classified: Secondary | ICD-10-CM | POA: Diagnosis present

## 2024-03-18 DIAGNOSIS — M6281 Muscle weakness (generalized): Secondary | ICD-10-CM | POA: Diagnosis present

## 2024-03-18 DIAGNOSIS — M25511 Pain in right shoulder: Secondary | ICD-10-CM | POA: Diagnosis present

## 2024-03-18 DIAGNOSIS — Z96611 Presence of right artificial shoulder joint: Secondary | ICD-10-CM

## 2024-03-18 NOTE — Therapy (Signed)
 OUTPATIENT PHYSICAL THERAPY SHOULDER TREATMENT   Patient Name: Melissa Mcdowell MRN: [id number] DOB:1951/06/01, 73 y.o., female Today's Date: 03/18/2024  END OF SESSION:  PT End of Session - 03/18/24 0842     Visit Number 7    Date for PT Re-Evaluation 04/05/24    Authorization Type [employer]    PT Start Time 0845    PT Stop Time 0930    PT Time Calculation (min) 45 min             Past Medical History:  Diagnosis Date   Acid reflux    occasional   Anemia    no current med.   Arthritis    lower back, fingers   Complication of anesthesia    states has a small mouth   Dental crowns present    Headache(784.0)    sinus - 3-4 x/month   Hypercholesteremia    Mucous cyst of finger 06/18/2012   left long finger   Past Surgical History:  Procedure Laterality Date   ABDOMINAL HYSTERECTOMY     complete   APPENDECTOMY     CATARACT EXTRACTION Bilateral    CESAREAN SECTION     ECTOPIC PREGNANCY SURGERY     MASS EXCISION  07/10/2012   Procedure: MINOR EXCISION OF MASS;  Surgeon: Amelie Baize., MD;  Location: [facility];  Service: Orthopedics;  Laterality: Left;   OOPHORECTOMY     REPAIR OF COMPLEX TRACTION RETINAL DETACHMENT Left 12/23/2018   Procedure: REPAIR OF  RETINAL DETACHMENT LEFT EYE;  Surgeon: Jearline Minder, MD;  Location: [facility] OR;  Service: Ophthalmology;  Laterality: Left;   TOTAL HIP ARTHROPLASTY Right 08/12/2023   Procedure: TOTAL HIP ARTHROPLASTY ANTERIOR APPROACH;  Surgeon: Claiborne Crew, MD;  Location: [facility];  Service: Orthopedics;  Laterality: Right;   Patient Active Problem List   Diagnosis Date Noted   Pre-op evaluation 12/22/2023   Age related osteoporosis 11/07/2023   Low hemoglobin 08/20/2023   Post-menopausal 08/20/2023   S/P total right hip arthroplasty 08/12/2023   Closed right hip fracture, initial encounter (HCC) 08/11/2023   Closed fracture of proximal end of right humerus 08/11/2023   Hyperglycemia 08/11/2023    Hypercalcemia 08/11/2023   Primary insomnia 05/26/2023   Elevated blood pressure reading 05/26/2023    PCP: Dianah Fort, DO  REFERRING PROVIDER: Yvonne Hering, MD  REFERRING DIAG: s/p right reverse TSA  THERAPY DIAG:  Acute pain of right shoulder  Stiffness of right shoulder, not elsewhere classified  Muscle weakness (generalized)  S/P reverse total shoulder arthroplasty, right  Rationale for Evaluation and Treatment: Rehabilitation  ONSET DATE: 01/07/24  SUBJECTIVE:  SUBJECTIVE STATEMENT:   Pt reported some soreness from the weekend. "He dog pulled me on the leash with 40# if power".  [employer] authorized 6 visits up front for this reverse TSA.  Patient is upset, reports that she is doing okay saw MD on Friday and we are at 4 weeks S/P surgery.  Patient had a fall 08/11/23. She sustained a right proximal humerus fracture.  She ended up with some AVN and the bone died, she underwent a right reverse TSA on 01/07/24.  She has been in sling, she reports that she is trying to not take as many narcotics.  Her PA called me last week and told me to avoid any shoulder motions for the first 4 weeks. Hand dominance: Right  PERTINENT HISTORY: THA  PAIN:  Are you having pain? Yes: NPRS scale: 3/10 Pain location: right shoulder Pain description: ache, sore Aggravating factors: as the day goes no, trying to not take pain meds pain 7/10 Relieving factors: ice, being in the sling and the pain meds, pain can be 0/10  PRECAUTIONS: Other: Reverse TSA PA called me and told me no ROM of the shoulder for the first 4 weeks, no pendulums  RED FLAGS: None   WEIGHT BEARING RESTRICTIONS: No  FALLS:  Has patient fallen in last 6 months? No  LIVING ENVIRONMENT: Lives with: lives with their family Lives in: House/apartment Stairs: Yes:  Internal: 12 steps; on right going up Has following equipment at home: None  OCCUPATION: retired  PLOF: Independent and does yardwork, housework  PATIENT GOALS:have better ROM, less pain, get dressed  NEXT MD VISIT:   OBJECTIVE:  Note: Objective measures were completed at Evaluation unless otherwise noted.  DIAGNOSTIC FINDINGS:  none  PATIENT SURVEYS:  Quick Dash 84%  COGNITION: Overall cognitive status: Within functional limits for tasks assessed     SENSATION: WFL  POSTURE: Fwd head, rounded shoulders, right shoulder elevated some  UPPER EXTREMITY ROM:   Passive ROM Right eval Left eval PROM  Right 02/09/24 AAROM Right 02/26/23 AROM Right 03/04/24 AROM 03/18/24  Shoulder flexion   80 110 82 83  Shoulder extension        Shoulder abduction   70 95 74 85  Shoulder adduction        Shoulder internal rotation        Shoulder external rotation   20     Elbow flexion        Elbow extension        Wrist flexion        Wrist extension        Wrist ulnar deviation        Wrist radial deviation        Wrist pronation        Wrist supination        (Blank rows = not tested)  UPPER EXTREMITY MMT:  MMT Right eval Left eval  Shoulder flexion    Shoulder extension    Shoulder abduction    Shoulder adduction    Shoulder internal rotation    Shoulder external rotation    Middle trapezius    Lower trapezius    Elbow flexion    Elbow extension    Wrist flexion    Wrist extension    Wrist ulnar deviation    Wrist radial deviation    Wrist pronation    Wrist supination    Grip strength (lbs)    (Blank rows = not tested)  JOINT MOBILITY TESTING:  Not tested  PALPATION:  Tightness in the upper trap, tight in the                                                                                                                              TREATMENT DATE:  03/18/24  UBE L2 2 min each way Quick Dash 61.4% Green band row to neutral 2x10 Red ext to Neutral 2x10    Wall slides flexion cirlcles   RUE PROM all allowed motions per protocol  Supine 1# Wate Bar   Scapular proractions flexion x10  Chect press x10 Rhythmic stabilizations RUE ER in scapular plane 2x10- PT help to stay in allowed ROM 4# bicep curl     03/11/24 UBE level 2 x 6 minutes Red tband row to neutral  Red tband extension to neutral Wall slides and circles 3# wate bar biceps Supine dowel chest press and flexion PROM all motions allowed in protocol Star gazer stretch 1# ER in supine 1# punches supine with PT limiting ROM 2# serratus  03/04/24 UBE L2 x 3 min each AAROM Flex & abs w/ dowel 2x10 Red tband rows to neutral Red tband extension to neutral Yellow Tband RUE ER 2x10 Scaption 2x10 4# hammer curl 2# biceps curl PROM  Right shoulder vaso medium pressure 34 degrees  02/26/24 UBE level 2 x 6 minutes Wall slides and circles limited ROM for pain control, AAROM with the left hand helping Red tband rows to neutral Red tband extension to neutral 4# hammer curl 2# biceps curl Gentle hand on ball vs wall small circles in front and in scapular plane Supine punches from neutral, very difficult, did varying angles Supine isometric circles Supine isometric abduction and adduction Added to HEP and performed see below PROM  Right shoulder vaso medium pressure 34 degrees  02/19/24 AAROM Flex, Ext, IR up back 1lb WaTE  Rows & Ext yellow 2x10 RUE isometrics 3''x5 flex, ext, IR and ER RUE PROM with end range holds within protocol limitations  RUE GH Jt mobs grade 1 Vaso RUE low pressure 34 deg x 10 min.  02/09/24 HEP below, educated patient performed with her PROM of the right shoulder with her supine, education of the spouse in the same and went over protocol and the ROM limitations  01/12/24 Evaluation   PATIENT EDUCATION: Education details: HEP/POC Person educated: Patient and Spouse Education method: Programmer, multimedia, Facilities manager, Verbal cues, and  Handouts Education comprehension: verbalized understanding  HOME EXERCISE PROGRAM: Access Code: [email or web address] URL: [email or web address] Date: 02/09/2024 Prepared by: Cherylene Corrente  Exercises - Supine Shoulder Flexion AAROM with Hands Clasped  - 2 x daily - 7 x weekly - 2 sets - 10 reps - 5 hold - Seated Shoulder External Rotation AAROM with Cane and Hand in Neutral  - 2 x daily - 7 x weekly - 2 sets - 10 reps - 5 hold - Seated Shoulder Abduction AAROM with Dowel  - 2 x daily - 7 x  weekly - 2 sets - 10 reps - 5 hold - Seated Elbow Flexion Shoulder Internal Rotation AAROM at Table with Towel  - 2 x daily - 7 x weekly - 2 sets - 10 reps - 5 hold - Seated Shoulder Flexion Towel Slide at Table Top  - 2 x daily - 7 x weekly - 2 sets - 10 reps - 5 hold  Access Code: [email or web address] URL: [email or web address] Date: 01/12/2024 Prepared by: Cherylene Corrente  Exercises - Standing Elbow Flexion Extension AROM  - 2 x daily - 7 x weekly - 1 sets - 10 reps - 3 hold - Standing Forearm Pronation and Supination AROM  - 2 x daily - 7 x weekly - 1 sets - 10 reps - 3 hold - Thumb Opposition  - 2 x daily - 7 x weekly - 1 sets - 10 reps - 3 hold - Seated Shoulder Shrug Circles AROM Backward  - 2 x daily - 7 x weekly - 1 sets - 10 reps - 3 hold - Seated Scapular Retraction  - 2 x daily - 7 x weekly - 1 sets - 10 reps - 3 hold Access Code: [email or web address] URL: [email or web address] Date: 02/26/2024 Prepared by: Cherylene Corrente  Exercises - Standing Row with Anchored Resistance  - 1 x daily - 7 x weekly - 3 sets - 10 reps - 3 hold - Shoulder extension with resistance - Neutral  - 1 x daily - 7 x weekly - 3 sets - 10 reps - 3 hold - Supine Shoulder Flexion AAROM with Hands Clasped  - 1 x daily - 7 x weekly - 3 sets - 10 reps - 3 hold - Supine Shoulder Alphabet  - 1 x daily - 7 x weekly - 3 sets - 10 reps - 3 hold ASSESSMENT:  CLINICAL IMPRESSION: Today is her last available  session. She is limited with all shoulder motions. She is unable to flex or abd to 90*. Her ER is limited by pain. She is unable to reach a functional ROM  in RUE or complete daily activities.Will be continuing with her HEP, but would benefit from cont. PT to improve her RUE ROM and strength to improver her overall function.  Patient is a 73 y.o. female who was seen today for physical therapy evaluation and treatment for s/p right TSA on 01/07/24.  She had a fall in September and fractured the proximal humerus, she developed AVN and the bone was not healing.  The PA called me on Thursday and told me that she wanted us  to be very careful and avoid any shoulder motions for the first 4 weeks, avoid even pendulums.  She reports that she is doing well in the sling and trying to not take much pain medication.  OBJECTIVE IMPAIRMENTS: cardiopulmonary status limiting activity, decreased activity tolerance, decreased coordination, decreased endurance, decreased ROM, decreased strength, increased edema, increased fascial restrictions, impaired perceived functional ability, increased muscle spasms, impaired flexibility, impaired UE functional use, impaired vision/preception, improper body mechanics, postural dysfunction, and pain.   REHAB POTENTIAL: Good  CLINICAL DECISION MAKING: Evolving/moderate complexity  EVALUATION COMPLEXITY: Moderate   GOALS: Goals reviewed with patient? Yes  SHORT TERM GOALS: Target date: 02/09/24  Independent with RICE Baseline: Goal status: met 02/09/24   LONG TERM GOALS: Target date: 04/10/24  Independent with HEP Baseline:  Goal status: ongoing 02/26/24, Met 03/18/24  2.  Decrease pain 50% Baseline: pain up to 7/10 at times Goal status: progressing 03/11/24  3.  Report able to  dress without help Baseline: husband currently dresses her Goal status: progressing 02/26/24, ongoing husband helps some 03/18/24  4.  Be able to do hair without help Baseline: husband is doing her  hair Goal status: progressing 03/11/24, progressing 03/18/24  5.  Increase AROM of the right shoulder flexion to 110 degrees Baseline: no motions Goal status: ongoing 03/18/24  6.  Be able to reach head high cabinet with 3# Baseline: unable to lift arm Goal status: ongoing 03/18/24  PLAN:  PT FREQUENCY: 2x/week  PT DURATION: 12 weeks  PLANNED INTERVENTIONS: 97164- PT Re-evaluation, 97110-Therapeutic exercises, 97530- Therapeutic activity, 97112- Neuromuscular re-education, 97535- Self Care, 40981- Manual therapy, G0283- Electrical stimulation (unattended), 97016- Vasopneumatic device, Patient/Family education, Joint mobilization, Scar mobilization, and Cryotherapy  PLAN FOR NEXT SESSION: Follow MD protocol in accordion file  next week will measure and assess and request more visits from the insurance   Laurelyn Ponder 03/18/2024, 8:42 AM

## 2024-03-19 NOTE — Telephone Encounter (Signed)
 Well I guess since she is already on alendronate  we can leave the Prolia alone for now.

## 2024-03-19 NOTE — Telephone Encounter (Signed)
 Patient started on Alendronate  by Sandy Crumb, PA  Still in need of Prolia at this time ?

## 2024-03-24 ENCOUNTER — Encounter: Payer: Self-pay | Admitting: Physician Assistant

## 2024-03-25 ENCOUNTER — Encounter: Payer: Self-pay | Admitting: Family Medicine

## 2024-04-20 ENCOUNTER — Other Ambulatory Visit: Payer: Self-pay | Admitting: Orthopaedic Surgery

## 2024-04-20 DIAGNOSIS — G8929 Other chronic pain: Secondary | ICD-10-CM

## 2024-04-22 ENCOUNTER — Ambulatory Visit
Admission: RE | Admit: 2024-04-22 | Discharge: 2024-04-22 | Disposition: A | Discharge status: Home / Self Care | Source: Ambulatory Visit | Attending: Orthopaedic Surgery | Admitting: Orthopaedic Surgery

## 2024-04-22 DIAGNOSIS — G8929 Other chronic pain: Secondary | ICD-10-CM

## 2024-05-25 ENCOUNTER — Inpatient Hospital Stay

## 2024-05-25 ENCOUNTER — Inpatient Hospital Stay: Attending: Hematology and Oncology | Admitting: Hematology and Oncology

## 2024-05-25 VITALS — BP 120/61 | HR 61 | Temp 98.1°F | Resp 17 | Ht 63.5 in | Wt 159.9 lb

## 2024-05-25 DIAGNOSIS — D801 Nonfamilial hypogammaglobulinemia: Secondary | ICD-10-CM | POA: Insufficient documentation

## 2024-05-25 DIAGNOSIS — D472 Monoclonal gammopathy: Secondary | ICD-10-CM

## 2024-05-25 NOTE — Addendum Note (Signed)
 Addended by: BONNETTA CLAUDEEN CROME on: 05/25/2024 01:45 PM   Modules accepted: Orders

## 2024-05-25 NOTE — Progress Notes (Signed)
 [facility] CONSULT NOTE  Patient Care Team: Bevin Bernice RAMAN, DO (Inactive) as PCP - General (Family Medicine)  CHIEF COMPLAINTS/PURPOSE OF CONSULTATION:  Abnormal serum protein electrophoresis  HISTORY OF PRESENTING ILLNESS:   History of Present Illness Melissa Mcdowell is a 73 year old female who presents with an abnormal protein band detected in blood work. She is accompanied by her husband, Gordy. She was referred   for further evaluation of her bone health and abnormal protein band findings.  An abnormal protein band was detected in serum protein electrophoresis.  She also had a hypogammaglobulinemia.  She has no symptoms of hypercalcemia, severe anemia, renal failure, or recurrent infections. Recent labs show normal creatinine and calcium  levels, with hemoglobin at 12.6.  She has osteoporosis with multiple fractures, including the shoulder and hip, and a fracture in the back of the shoulder. She is not on Fosamax  but takes calcium  supplements. Recent imaging shows no major bone lesions, and calcium  levels are normal.  Her mother was diagnosed with an autoimmune condition after age 60. There are no frequent infections, diarrhea, or other symptoms suggesting a hematological issue.  I reviewed her records extensively and collaborated the history with the patient.   MEDICAL HISTORY:  Past Medical History:  Diagnosis Date   Acid reflux    occasional   Anemia    no current med.   Arthritis    lower back, fingers   Complication of anesthesia    states has a small mouth   Dental crowns present    Headache(784.0)    sinus - 3-4 x/month   Hypercholesteremia    Mucous cyst of finger 06/18/2012   left long finger    SURGICAL HISTORY: Past Surgical History:  Procedure Laterality Date   ABDOMINAL HYSTERECTOMY     complete   APPENDECTOMY     CATARACT EXTRACTION Bilateral    CESAREAN SECTION     ECTOPIC PREGNANCY SURGERY     MASS EXCISION  07/10/2012    Procedure: MINOR EXCISION OF MASS;  Surgeon: Lamar LULLA Leonor Mickey., MD;  Location: [facility];  Service: Orthopedics;  Laterality: Left;   OOPHORECTOMY     REPAIR OF COMPLEX TRACTION RETINAL DETACHMENT Left 12/23/2018   Procedure: REPAIR OF  RETINAL DETACHMENT LEFT EYE;  Surgeon: Tobie Baptist, MD;  Location: [facility] OR;  Service: Ophthalmology;  Laterality: Left;   TOTAL HIP ARTHROPLASTY Right 08/12/2023   Procedure: TOTAL HIP ARTHROPLASTY ANTERIOR APPROACH;  Surgeon: Ernie Cough, MD;  Location: [facility];  Service: Orthopedics;  Laterality: Right;    SOCIAL HISTORY: Social History   Socioeconomic History   Marital status: Married    Spouse name: Karrah Mangini   Number of children: Not on file   Years of education: Not on file   Highest education level: Bachelor's degree (e.g., BA, AB, BS)  Occupational History   Occupation: Retired  Tobacco Use   Smoking status: Never    Passive exposure: Never   Smokeless tobacco: Never  Vaping Use   Vaping status: Never Used  Substance and Sexual Activity   Alcohol use: Yes    Alcohol/week: 3.0 standard drinks of alcohol    Types: 3 Glasses of wine per week    Comment: rarely   Drug use: No   Sexual activity: Yes    Partners: Male    Birth control/protection: Surgical  Other Topics Concern   Not on file  Social History Narrative   Not on file  Social Drivers of Corporate investment banker Strain: Low Risk  (11/03/2023)   Overall Financial Resource Strain (CARDIA)    Difficulty of Paying Living Expenses: Not very hard  Food Insecurity: No Food Insecurity (11/03/2023)   Hunger Vital Sign    Worried About Running Out of Food in the Last Year: Never true    Ran Out of Food in the Last Year: Never true  Transportation Needs: No Transportation Needs (11/03/2023)   PRAPARE - Administrator, Civil Service (Medical): No    Lack of Transportation (Non-Medical): No  Physical Activity: Insufficiently Active  (11/03/2023)   Exercise Vital Sign    Days of Exercise per Week: 3 days    Minutes of Exercise per Session: 30 min  Stress: No Stress Concern Present (11/03/2023)   [employer] of Occupational Health - Occupational Stress Questionnaire    Feeling of Stress : Only a little  Social Connections: Moderately Integrated (09/08/2023)   Social Connection and Isolation Panel    Frequency of Communication with Friends and Family: Three times a week    Frequency of Social Gatherings with Friends and Family: Once a week    Attends Religious Services: 1 to 4 times per year    Active Member of [employer] or Organizations: No    Attends Engineer, structural: Not on file    Marital Status: Married  Catering manager Violence: Not At Risk (08/11/2023)   Humiliation, Afraid, Rape, and Kick questionnaire    Fear of Current or Ex-Partner: No    Emotionally Abused: No    Physically Abused: No    Sexually Abused: No    FAMILY HISTORY: Family History  Problem Relation Age of Onset   Stroke Mother    Diabetes Mother    Stroke Father    Anesthesia problems Father        post-op N/V   Heart attack Father    Parkinson's disease Father    Hypertension Brother     ALLERGIES:  is allergic to sulfa antibiotics, amoxicillin, alendronate , statins, adhesive [tape], and morphine and codeine.  MEDICATIONS:  Current Outpatient Medications  Medication Sig Dispense Refill   alendronate  (FOSAMAX ) 70 MG tablet Take 1 tablet (70 mg total) by mouth every 7 (seven) days. Stay upright for 30 minutes after taking tablet. 4 tablet 11   ascorbic acid (VITAMIN C) 500 MG tablet Take 1,000 mg by mouth daily.     Calcium  Carbonate-Vit D-Min (CALCIUM  600+D PLUS MINERALS) 600-400 MG-UNIT TABS 1 tab p.o. twice daily 180 tablet 3   estradiol  (ESTRACE ) 0.5 MG tablet Take 1 tablet (0.5 mg total) by mouth daily. 90 tablet 1   ezetimibe  (ZETIA ) 10 MG tablet Take 1 tablet (10 mg total) by mouth daily. 90 tablet 2    Fluticasone Propionate (KLS ALLER-FLO NA) Place into the nose.     loratadine (CLARITIN) 10 MG tablet Take 10 mg by mouth daily.     tiZANidine  (ZANAFLEX ) 2 MG tablet Take 1-2 tablets (2-4 mg total) by mouth every 8 (eight) hours as needed for muscle spasms. 30 tablet 2   No current facility-administered medications for this visit.    REVIEW OF SYSTEMS:   Constitutional: Denies fevers, chills or abnormal night sweats   All other systems were reviewed with the patient and are negative.  PHYSICAL EXAMINATION: ECOG PERFORMANCE STATUS: 1 - Symptomatic but completely ambulatory  Vitals:   05/25/24 1302  BP: 120/61  Pulse: 61  Resp: 17  Temp: 98.1  F (36.7 C)  SpO2: 97%   Filed Weights   05/25/24 1302  Weight: 159 lb 14.4 oz (72.5 kg)    GENERAL:alert, no distress and comfortable    LABORATORY DATA:  I have reviewed the data as listed Lab Results  Component Value Date   WBC 6.3 08/20/2023   HGB 9.8 (L) 08/20/2023   HCT 31.4 (L) 08/20/2023   MCV 99 (H) 08/20/2023   PLT 322 08/20/2023   Lab Results  Component Value Date   NA 140 02/18/2024   K 4.5 02/18/2024   CL 102 02/18/2024   CO2 22 02/18/2024    RADIOGRAPHIC STUDIES: I have personally reviewed the radiological reports and agreed with the findings in the report.  ASSESSMENT AND PLAN:    Abnormal protein band/hypogammaglobinemia: Counseling: I discussed with the patient the spectrum of disorders from MGUS to multiple myeloma. We discussed the role of plasma cells in producing immunoglobulins. We discussed structure of immunoglobulins on how they make up the heavy chains and the light chains. The light chains are Kappa and lambda. I discussed the difference between MGUS and multiple myeloma. MGUS is characterized by elevation monoclonal protein without any end organ damage. Multiple myeloma is associated with elevation monoclonal protein and end organ damage (hypercalcemia, renal dysfunction, anemia, bone lytic  lesions ) along with a bone marrow showing greater than 10% plasma cells.  Workup recommended: 1.  Quantitative immunoglobulins 2. serum protein electrophoresis with immunofixation, beta-2 microglobulin and kappa lambda ratio 3. Bone survey: Did not recommend because she had multiple x-rays CT scans and MRIs and none of them showed any lytic lesions   Telephone visit in 2 weeks to discuss results   Assessment and Plan Assessment & Plan Osteoporosis Osteoporosis with multiple fractures, likely due to bone softening. Previous Fosamax  treatment ineffective. Normal calcium  with supplementation.       All questions were answered. The patient knows to call the clinic with any problems, questions or concerns.    Viinay K Helene Bernstein, MD 05/25/24

## 2024-05-26 LAB — KAPPA/LAMBDA LIGHT CHAINS
Kappa free light chain: 14.5 mg/L (ref 3.3–19.4)
Kappa, lambda light chain ratio: 0.81 (ref 0.26–1.65)
Lambda free light chains: 17.8 mg/L (ref 5.7–26.3)

## 2024-05-27 LAB — IGG, IGA, IGM
IgA: 66 mg/dL (ref 64–422)
IgG (Immunoglobin G), Serum: 756 mg/dL (ref 586–1602)
IgM (Immunoglobulin M), Srm: 251 mg/dL — ABNORMAL HIGH (ref 26–217)

## 2024-05-28 LAB — MULTIPLE MYELOMA PANEL, SERUM
Albumin SerPl Elph-Mcnc: 4 g/dL (ref 2.9–4.4)
Albumin/Glob SerPl: 1.5 (ref 0.7–1.7)
Alpha 1: 0.3 g/dL (ref 0.0–0.4)
Alpha2 Glob SerPl Elph-Mcnc: 0.7 g/dL (ref 0.4–1.0)
B-Globulin SerPl Elph-Mcnc: 1 g/dL (ref 0.7–1.3)
Gamma Glob SerPl Elph-Mcnc: 0.8 g/dL (ref 0.4–1.8)
Globulin, Total: 2.7 g/dL (ref 2.2–3.9)
IgA: 67 mg/dL (ref 64–422)
IgG (Immunoglobin G), Serum: 770 mg/dL (ref 586–1602)
IgM (Immunoglobulin M), Srm: 252 mg/dL — ABNORMAL HIGH (ref 26–217)
Total Protein ELP: 6.7 g/dL (ref 6.0–8.5)

## 2024-06-16 ENCOUNTER — Inpatient Hospital Stay (HOSPITAL_BASED_OUTPATIENT_CLINIC_OR_DEPARTMENT_OTHER): Admitting: Hematology and Oncology

## 2024-06-16 DIAGNOSIS — D892 Hypergammaglobulinemia, unspecified: Secondary | ICD-10-CM | POA: Insufficient documentation

## 2024-06-16 NOTE — Progress Notes (Signed)
 HEMATOLOGY-ONCOLOGY TELEPHONE VISIT PROGRESS NOTE  I connected with our patient on 06/16/24 at  9:45 AM EDT by telephone and verified that I am speaking with the correct person using two identifiers.  I discussed the limitations, risks, security and privacy concerns of performing an evaluation and management service by telephone and the availability of in person appointments.  I also discussed with the patient that there may be a patient responsible charge related to this service. The patient expressed understanding and agreed to proceed.   History of Present Illness: Follow-up of hypergammaglobulinemia  History of Present Illness Melissa Mcdowell is a 73 year old female who presents for follow-up regarding her blood work and immunoglobulin levels.  Recent tests, including electrophoresis, show no monoclonal protein. There is a polyclonal increase in immunoglobulins with slightly elevated IgM levels. Kappa and lambda light chains are normal. She had slight anemia in October of the previous year, which is under monitoring by her primary care physician.      REVIEW OF SYSTEMS:   Constitutional: Denies fevers, chills or abnormal weight loss All other systems were reviewed with the patient and are negative. Observations/Objective:     Assessment Plan:  Hypergammaglobulinemia Lab review: 05/25/2024: IgM: 251 (mildly elevated), M Protein: Not observed, IFE: Polyclonal increase, Kappa: 14.5, Lambda 17.8, Ratio 0.81  No concern for bone marrow disorder No indication for bone marrow biopsy.  Please do not hesitate to send her back if any other hematological issues arise. RTC as needed      I discussed the assessment and treatment plan with the patient. The patient was provided an opportunity to ask questions and all were answered. The patient agreed with the plan and demonstrated an understanding of the instructions. The patient was advised to call back or seek an in-person evaluation  if the symptoms worsen or if the condition fails to improve as anticipated.   I provided 12 minutes of non-face-to-face time during this encounter.  This includes time for charting and coordination of care   Naomi MARLA Chad, MD

## 2024-06-16 NOTE — Assessment & Plan Note (Signed)
 Lab review: 05/25/2024: IgM: 251 (mildly elevated), M Protein: Not observed, IFE: Polyclonal increase, Kappa: 14.5, Lambda 17.8, Ratio 0.81  No concern for bone marrow disorder No indication for bone marrow biopsy. RTC as needed

## 2024-07-09 ENCOUNTER — Other Ambulatory Visit: Payer: Self-pay | Admitting: Urgent Care

## 2024-07-09 NOTE — Telephone Encounter (Signed)
 Copied from CRM [id number]. Topic: Clinical - Medication Refill >> Jul 09, 2024 12:41 PM Kevelyn M wrote: Medication: estradiol  (ESTRACE ) 0.5 MG tablet  Has the patient contacted their pharmacy? Yes (Agent: If no, request that the patient contact the pharmacy for the refill. If patient does not wish to contact the pharmacy document the reason why and proceed with request.) (Agent: If yes, when and what did the pharmacy advise?)  This is the patient's preferred pharmacy:  [facility] [id number], [place] - [address] [place] [address] Phone: [phone] Fax: [phone]  Is this the correct pharmacy for this prescription? Yes If no, delete pharmacy and type the correct one.   Has the prescription been filled recently? No  Is the patient out of the medication? No  Has the patient been seen for an appointment in the last year OR does the patient have an upcoming appointment? Yes  Can we respond through MyChart? Yes  Agent: Please be advised that Rx refills may take up to 3 business days. We ask that you follow-up with your pharmacy.

## 2024-07-13 MED ORDER — ESTRADIOL 0.5 MG PO TABS: 0.5000 mg | ORAL_TABLET | Freq: Every day | ORAL | 1 refills | Status: AC

## 2024-07-20 ENCOUNTER — Encounter: Payer: Self-pay | Admitting: Sports Medicine

## 2024-08-24 ENCOUNTER — Other Ambulatory Visit: Payer: Self-pay

## 2024-08-24 ENCOUNTER — Ambulatory Visit: Attending: Orthopaedic Surgery | Admitting: Physical Therapy

## 2024-08-24 ENCOUNTER — Encounter: Payer: Self-pay | Admitting: Physical Therapy

## 2024-08-24 DIAGNOSIS — Z96611 Presence of right artificial shoulder joint: Secondary | ICD-10-CM | POA: Insufficient documentation

## 2024-08-24 DIAGNOSIS — M6281 Muscle weakness (generalized): Secondary | ICD-10-CM | POA: Diagnosis present

## 2024-08-24 DIAGNOSIS — M25611 Stiffness of right shoulder, not elsewhere classified: Secondary | ICD-10-CM | POA: Insufficient documentation

## 2024-08-24 DIAGNOSIS — M25511 Pain in right shoulder: Secondary | ICD-10-CM | POA: Insufficient documentation

## 2024-08-24 NOTE — Therapy (Signed)
 OUTPATIENT PHYSICAL THERAPY SHOULDER EVALUATION   Patient Name: Melissa Mcdowell MRN: [id number] DOB:1951/09/30, 73 y.o., female Today's Date: 08/24/2024  END OF SESSION:  PT End of Session - 08/24/24 0932     Visit Number 1    Number of Visits 13    Date for Recertification  11/17/24    Authorization Type [employer]    PT Start Time 0930    PT Stop Time 1014    PT Time Calculation (min) 44 min    Activity Tolerance Patient tolerated treatment well    Behavior During Therapy WFL for tasks assessed/performed          Past Medical History:  Diagnosis Date   Acid reflux    occasional   Anemia    no current med.   Arthritis    lower back, fingers   Complication of anesthesia    states has a small mouth   Dental crowns present    Headache(784.0)    sinus - 3-4 x/month   Hypercholesteremia    Mucous cyst of finger 06/18/2012   left long finger   Past Surgical History:  Procedure Laterality Date   ABDOMINAL HYSTERECTOMY     complete   APPENDECTOMY     CATARACT EXTRACTION Bilateral    CESAREAN SECTION     ECTOPIC PREGNANCY SURGERY     MASS EXCISION  07/10/2012   Procedure: MINOR EXCISION OF MASS;  Surgeon: Lamar LULLA Leonor Mickey., MD;  Location: [facility];  Service: Orthopedics;  Laterality: Left;   OOPHORECTOMY     REPAIR OF COMPLEX TRACTION RETINAL DETACHMENT Left 12/23/2018   Procedure: REPAIR OF  RETINAL DETACHMENT LEFT EYE;  Surgeon: Tobie Baptist, MD;  Location: [facility] OR;  Service: Ophthalmology;  Laterality: Left;   TOTAL HIP ARTHROPLASTY Right 08/12/2023   Procedure: TOTAL HIP ARTHROPLASTY ANTERIOR APPROACH;  Surgeon: Ernie Cough, MD;  Location: [facility];  Service: Orthopedics;  Laterality: Right;   Patient Active Problem List   Diagnosis Date Noted   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   Pre-op evaluation 12/22/2023   Age related osteoporosis 11/07/2023   Low hemoglobin 08/20/2023   Post-menopausal 08/20/2023   S/P total right hip arthroplasty  08/12/2023   Closed right hip fracture, initial encounter (HCC) 08/11/2023   Closed fracture of proximal end of right humerus 08/11/2023   Hyperglycemia 08/11/2023   Hypercalcemia 08/11/2023   Primary insomnia 05/26/2023   Elevated blood pressure reading 05/26/2023    PCP: Bevin, DO  REFERRING PROVIDER: Cristy, MD  REFERRING DIAG: s/p right reverse TSA  THERAPY DIAG:  Stiffness of right shoulder, not elsewhere classified  Muscle weakness (generalized)  Acute pain of right shoulder  S/P reverse total shoulder arthroplasty, right  Rationale for Evaluation and Treatment: Rehabilitation  ONSET DATE: 01/07/24  SUBJECTIVE:  SUBJECTIVE STATEMENT: Patient had a fall 08/11/23. She sustained a right proximal humerus fracture.  She ended up with some AVN and the bone died, she underwent a right reverse TSA on 01/07/24.  She reports that she recovered pretty good, she reports that towards the last of the rehab she was found to have a small fracture in the shoulder blade, found to have osteoporosis, she has been started on a new medication for 6 weeks, her c/o to me is weakness, feels like her ROM is good and has only soreness. Hand dominance: Right  PERTINENT HISTORY: THA  PAIN:  Are you having pain? Yes: NPRS scale: 2/10 Pain location: right shoulder Pain description: ache, sore Aggravating factors: as the day goes no, lying on the right side pain up to  3-4/10 Relieving factors: ibuprofen , rest 0/10  PRECAUTIONS: none  RED FLAGS: None   WEIGHT BEARING RESTRICTIONS: No  FALLS:  Has patient fallen in last 6 months? No  LIVING ENVIRONMENT: Lives with: lives with their family Lives in: House/apartment Stairs: Yes: Internal: 12 steps; on right going up Has following equipment at home:  None  OCCUPATION: retired  PLOF: Independent and does yardwork, housework, walks dog  PATIENT GOALS: feel stronger, I can't put things on upper shelves, can't vacuum the stairs  NEXT MD VISIT:   OBJECTIVE:  Note: Objective measures were completed at Evaluation unless otherwise noted.  DIAGNOSTIC FINDINGS:  none  PATIENT SURVEYS:  Quick Dash 43%  COGNITION: Overall cognitive status: Within functional limits for tasks assessed     SENSATION: WFL  POSTURE: Fwd head, rounded shoulders, right shoulder elevated some  UPPER EXTREMITY ROM: some pain at end ranges  Passive ROM Right AROM eval Right PROM eval  Shoulder flexion 110 122  Shoulder extension    Shoulder abduction 90 110  Shoulder adduction    Shoulder internal rotation 45 50  Shoulder external rotation 65 70  Elbow flexion    Elbow extension    Wrist flexion    Wrist extension    Wrist ulnar deviation    Wrist radial deviation    Wrist pronation    Wrist supination    (Blank rows = not tested)  UPPER EXTREMITY MMT: For the available ROM above MMT Right eval Left eval  Shoulder flexion 3+   Shoulder extension    Shoulder abduction 3+   Shoulder adduction    Shoulder internal rotation 3+   Shoulder external rotation 3+   Middle trapezius    Lower trapezius    Elbow flexion 4+   Elbow extension 4-   Wrist flexion    Wrist extension    Wrist ulnar deviation    Wrist radial deviation    Wrist pronation    Wrist supination    Grip strength (lbs)    (Blank rows = not tested)  JOINT MOBILITY TESTING:  Not tested  PALPATION:  Tightness in the upper trap, tight in the rhomboid  FUNCTIONAL TEST: can reach with 1# weight to head high shelf, can reach to shoulder high shelf with 2# but has to compensate and has some pain, cannot reach to shoulder height with 3#  TREATMENT  DATE:  08/24/24 Evaluation   PATIENT EDUCATION: Education details: HEP/POC Person educated: Patient and Spouse Education method: Explanation, Facilities manager, Verbal cues, and Handouts Education comprehension: verbalized understanding  HOME EXERCISE PROGRAM: Access Code: [email or web address] URL: [email or web address] Date: 08/24/2024 Prepared by: Ozell Mainland  Exercises - Shoulder Flexion Wall Slide with Towel  - 2 x daily - 7 x weekly - 1 sets - 10 reps - 3 hold - Scaption Wall Slide with Towel  - 2 x daily - 7 x weekly - 1 sets - 10 reps - 3 hold - Shoulder Circles on Wall with Towel  - 2 x daily - 7 x weekly - 1 sets - 10 reps - 3 hold ASSESSMENT:  CLINICAL IMPRESSION: Patient is a 73 y.o. female who was seen today for physical therapy evaluation and treatment for s/p right TSA on 01/07/24.  She had a fall in September and fractured the proximal humerus, she developed AVN and the bone was not healing.  Had rehab here and was recovering well, in June had some increased pain and was found to have a fracture in scapula.  Was put in a sling for 6 weeks, became weak and very stiff, she did start an osteoporosis medication due to having broken 3 bones in a year.   OBJECTIVE IMPAIRMENTS: cardiopulmonary status limiting activity, decreased activity tolerance, decreased coordination, decreased endurance, decreased ROM, decreased strength, increased edema, increased fascial restrictions, impaired perceived functional ability, increased muscle spasms, impaired flexibility, impaired UE functional use, impaired vision/preception, improper body mechanics, postural dysfunction, and pain.   REHAB POTENTIAL: Good  CLINICAL DECISION MAKING: Evolving/moderate complexity  EVALUATION COMPLEXITY: Moderate   GOALS: Goals reviewed with patient? Yes  SHORT TERM GOALS: Target date: 09/14/24  Independent with initial HEP Baseline: Goal status: INITIAL   LONG TERM GOALS: Target date:  11/17/24  Independent with advanced HEP Baseline:  Goal status: INITIAL  2.  Decrease pain 50% Baseline: pain up to 5/10 with some reaching and lying on the right side Goal status: INITIAL  3.  Report to reach into upper cabinet with a glass Baseline: can reach head high with 1# and shoulder high with 2# Goal status: INITIAL  4.  Improve Quick DASH to 20% Baseline: 43%  Goal status: INITIAL  5.  Increase AROM of the right shoulder flexion to 130 degrees to be more functional Baseline: 110 degrees flexion Goal status: INITIAL  6.  Be able to reach head high cabinet with 3# Baseline: struggles with 1# to head high Goal status: INITIAL  PLAN:  PT FREQUENCY: 1X/week  PT DURATION: 12 weeks  PLANNED INTERVENTIONS: 97164- PT Re-evaluation, 97110-Therapeutic exercises, 97530- Therapeutic activity, 97112- Neuromuscular re-education, 97535- Self Care, 02859- Manual therapy, [id number]- Electrical stimulation (unattended), 97016- Vasopneumatic device, Patient/Family education, Joint mobilization, Scar mobilization, and Cryotherapy  PLAN FOR NEXT SESSION: work on functional [employer], PT 08/24/2024, 9:33 AM

## 2024-09-01 ENCOUNTER — Encounter: Payer: Self-pay | Admitting: Physical Therapy

## 2024-09-01 ENCOUNTER — Ambulatory Visit: Admitting: Physical Therapy

## 2024-09-01 DIAGNOSIS — M6281 Muscle weakness (generalized): Secondary | ICD-10-CM

## 2024-09-01 DIAGNOSIS — Z96611 Presence of right artificial shoulder joint: Secondary | ICD-10-CM

## 2024-09-01 DIAGNOSIS — M25511 Pain in right shoulder: Secondary | ICD-10-CM

## 2024-09-01 DIAGNOSIS — M25611 Stiffness of right shoulder, not elsewhere classified: Secondary | ICD-10-CM | POA: Diagnosis not present

## 2024-09-01 NOTE — Therapy (Signed)
 OUTPATIENT PHYSICAL THERAPY SHOULDER TREATMENT   Patient Name: Melissa Mcdowell MRN: [id number] DOB:05-01-1951, 73 y.o., female Today's Date: 09/01/2024  END OF SESSION:  PT End of Session - 09/01/24 0934     Visit Number 2    Date for Recertification  11/17/24    PT Start Time 0930    PT Stop Time 1015    PT Time Calculation (min) 45 min    Activity Tolerance Patient tolerated treatment well    Behavior During Therapy [facility] for tasks assessed/performed          Past Medical History:  Diagnosis Date   Acid reflux    occasional   Anemia    no current med.   Arthritis    lower back, fingers   Complication of anesthesia    states has a small mouth   Dental crowns present    Headache(784.0)    sinus - 3-4 x/month   Hypercholesteremia    Mucous cyst of finger 06/18/2012   left long finger   Past Surgical History:  Procedure Laterality Date   ABDOMINAL HYSTERECTOMY     complete   APPENDECTOMY     CATARACT EXTRACTION Bilateral    CESAREAN SECTION     ECTOPIC PREGNANCY SURGERY     MASS EXCISION  07/10/2012   Procedure: MINOR EXCISION OF MASS;  Surgeon: Lamar LULLA Leonor Mickey., MD;  Location: [facility];  Service: Orthopedics;  Laterality: Left;   OOPHORECTOMY     REPAIR OF COMPLEX TRACTION RETINAL DETACHMENT Left 12/23/2018   Procedure: REPAIR OF  RETINAL DETACHMENT LEFT EYE;  Surgeon: Tobie Baptist, MD;  Location: [facility] OR;  Service: Ophthalmology;  Laterality: Left;   TOTAL HIP ARTHROPLASTY Right 08/12/2023   Procedure: TOTAL HIP ARTHROPLASTY ANTERIOR APPROACH;  Surgeon: Ernie Cough, MD;  Location: [facility];  Service: Orthopedics;  Laterality: Right;   Patient Active Problem List   Diagnosis Date Noted   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   Pre-op evaluation 12/22/2023   Age related osteoporosis 11/07/2023   Low hemoglobin 08/20/2023   Post-menopausal 08/20/2023   S/P total right hip arthroplasty 08/12/2023   Closed right hip fracture, initial  encounter (HCC) 08/11/2023   Closed fracture of proximal end of right humerus 08/11/2023   Hyperglycemia 08/11/2023   Hypercalcemia 08/11/2023   Primary insomnia 05/26/2023   Elevated blood pressure reading 05/26/2023    PCP: Bevin, DO  REFERRING PROVIDER: Cristy, MD  REFERRING DIAG: s/p right reverse TSA  THERAPY DIAG:  Stiffness of right shoulder, not elsewhere classified  Muscle weakness (generalized)  Acute pain of right shoulder  S/P reverse total shoulder arthroplasty, right  Rationale for Evaluation and Treatment: Rehabilitation  ONSET DATE: 01/07/24  SUBJECTIVE:  SUBJECTIVE STATEMENT:   I am all healed up just sore and weak    Patient had a fall 08/11/23. She sustained a right proximal humerus fracture.  She ended up with some AVN and the bone died, she underwent a right reverse TSA on 01/07/24.  She reports that she recovered pretty good, she reports that towards the last of the rehab she was found to have a small fracture in the shoulder blade, found to have osteoporosis, she has been started on a new medication for 6 weeks, her c/o to me is weakness, feels like her ROM is good and has only soreness. Hand dominance: Right  PERTINENT HISTORY: THA  PAIN:  Are you having pain? Yes: NPRS scale: 2/10 Pain location: right shoulder Pain description: ache, sore Aggravating factors: as the day goes no, lying on the right side pain up to  3-4/10 Relieving factors: ibuprofen , rest 0/10  PRECAUTIONS: none  RED FLAGS: None   WEIGHT BEARING RESTRICTIONS: No  FALLS:  Has patient fallen in last 6 months? No  LIVING ENVIRONMENT: Lives with: lives with their family Lives in: House/apartment Stairs: Yes: Internal: 12 steps; on right going up Has following equipment at home:  None  OCCUPATION: retired  PLOF: Independent and does yardwork, housework, walks dog  PATIENT GOALS: feel stronger, I can't put things on upper shelves, can't vacuum the stairs  NEXT MD VISIT:   OBJECTIVE:  Note: Objective measures were completed at Evaluation unless otherwise noted.  DIAGNOSTIC FINDINGS:  none  PATIENT SURVEYS:  Quick Dash 43%  COGNITION: Overall cognitive status: Within functional limits for tasks assessed     SENSATION: WFL  POSTURE: Fwd head, rounded shoulders, right shoulder elevated some  UPPER EXTREMITY ROM: some pain at end ranges  Passive ROM Right AROM eval Right PROM eval  Shoulder flexion 110 122  Shoulder extension    Shoulder abduction 90 110  Shoulder adduction    Shoulder internal rotation 45 50  Shoulder external rotation 65 70  Elbow flexion    Elbow extension    Wrist flexion    Wrist extension    Wrist ulnar deviation    Wrist radial deviation    Wrist pronation    Wrist supination    (Blank rows = not tested)  UPPER EXTREMITY MMT: For the available ROM above MMT Right eval Left eval  Shoulder flexion 3+   Shoulder extension    Shoulder abduction 3+   Shoulder adduction    Shoulder internal rotation 3+   Shoulder external rotation 3+   Middle trapezius    Lower trapezius    Elbow flexion 4+   Elbow extension 4-   Wrist flexion    Wrist extension    Wrist ulnar deviation    Wrist radial deviation    Wrist pronation    Wrist supination    Grip strength (lbs)    (Blank rows = not tested)  JOINT MOBILITY TESTING:  Not tested  PALPATION:  Tightness in the upper trap, tight in the rhomboid  FUNCTIONAL TEST: can reach with 1# weight to head high shelf, can reach to shoulder high shelf with 2# but has to compensate and has some pain, cannot reach to shoulder height with 3#  TREATMENT  DATE:  09/01/24 UBE L3 X 3 min each  AAROM 3lb WaTE Flex, Ext, IR x10  Rows & exr green 2x10 Shoulder abd 1lb 2x10 RUE IR red 2x10 RUE ER yellow 2x10 RUE PROM in all directions with end range holds  R GH joint mobs  08/24/24 Evaluation   PATIENT EDUCATION: Education details: HEP/POC Person educated: Patient and Spouse Education method: Explanation, Facilities manager, Verbal cues, and Handouts Education comprehension: verbalized understanding  HOME EXERCISE PROGRAM: Access Code: [email or web address] URL: [email or web address] Date: 08/24/2024 Prepared by: Ozell Mainland  Exercises - Shoulder Flexion Wall Slide with Towel  - 2 x daily - 7 x weekly - 1 sets - 10 reps - 3 hold - Scaption Wall Slide with Towel  - 2 x daily - 7 x weekly - 1 sets - 10 reps - 3 hold - Shoulder Circles on Wall with Towel  - 2 x daily - 7 x weekly - 1 sets - 10 reps - 3 hold ASSESSMENT:  CLINICAL IMPRESSION: Patient is a 73 y.o. female who was seen today for physical therapy treatment for s/p right TSA on 01/07/24.  She had a fall in September and fractured the proximal humerus, she developed AVN and the bone was not healing.  Had rehab here and was recovering well, in June had some increased pain and was found to have a fracture in scapula.  Was put in a sling for 6 weeks, became weak and very stiff, she did start an osteoporosis medication due to having broken 3 bones in a year.  She enters today with some soreness. She did well with a progression to AAROM and light AROM. Weakness present with both internal and external rotation.   OBJECTIVE IMPAIRMENTS: cardiopulmonary status limiting activity, decreased activity tolerance, decreased coordination, decreased endurance, decreased ROM, decreased strength, increased edema, increased fascial restrictions, impaired perceived functional ability, increased muscle spasms, impaired flexibility, impaired UE functional use, impaired vision/preception, improper body  mechanics, postural dysfunction, and pain.   REHAB POTENTIAL: Good  CLINICAL DECISION MAKING: Evolving/moderate complexity  EVALUATION COMPLEXITY: Moderate   GOALS: Goals reviewed with patient? Yes  SHORT TERM GOALS: Target date: 09/14/24  Independent with initial HEP Baseline: Goal status: Progressing 09/01/24   LONG TERM GOALS: Target date: 11/17/24  Independent with advanced HEP Baseline:  Goal status: INITIAL  2.  Decrease pain 50% Baseline: pain up to 5/10 with some reaching and lying on the right side Goal status: INITIAL  3.  Report to reach into upper cabinet with a glass Baseline: can reach head high with 1# and shoulder high with 2# Goal status: INITIAL  4.  Improve Quick DASH to 20% Baseline: 43%  Goal status: INITIAL  5.  Increase AROM of the right shoulder flexion to 130 degrees to be more functional Baseline: 110 degrees flexion Goal status: INITIAL  6.  Be able to reach head high cabinet with 3# Baseline: struggles with 1# to head high Goal status: INITIAL  PLAN:  PT FREQUENCY: 1X/week  PT DURATION: 12 weeks  PLANNED INTERVENTIONS: 97164- PT Re-evaluation, 97110-Therapeutic exercises, 97530- Therapeutic activity, 97112- Neuromuscular re-education, 97535- Self Care, 02859- Manual therapy, G0283- Electrical stimulation (unattended), 97016- Vasopneumatic device, Patient/Family education, Joint mobilization, Scar mobilization, and Cryotherapy  PLAN FOR NEXT SESSION: work on functional strength  Tanda KANDICE Sorrow, PTA 09/01/2024, 9:34 AM

## 2024-09-08 ENCOUNTER — Encounter: Payer: Self-pay | Admitting: Physical Therapy

## 2024-09-08 ENCOUNTER — Ambulatory Visit: Admitting: Physical Therapy

## 2024-09-08 DIAGNOSIS — M25611 Stiffness of right shoulder, not elsewhere classified: Secondary | ICD-10-CM | POA: Diagnosis not present

## 2024-09-08 DIAGNOSIS — M25511 Pain in right shoulder: Secondary | ICD-10-CM

## 2024-09-08 DIAGNOSIS — Z96611 Presence of right artificial shoulder joint: Secondary | ICD-10-CM

## 2024-09-08 DIAGNOSIS — M6281 Muscle weakness (generalized): Secondary | ICD-10-CM

## 2024-09-08 NOTE — Therapy (Signed)
 OUTPATIENT PHYSICAL THERAPY SHOULDER TREATMENT   Patient Name: Melissa Mcdowell MRN: [id number] DOB:16-Jun-1951, 73 y.o., female Today's Date: 09/08/2024  END OF SESSION:  PT End of Session - 09/08/24 0932     Visit Number 3    Date for Recertification  11/17/24    PT Start Time 0930    PT Stop Time 1015    PT Time Calculation (min) 45 min    Activity Tolerance Patient tolerated treatment well    Behavior During Therapy [facility] for tasks assessed/performed          Past Medical History:  Diagnosis Date   Acid reflux    occasional   Anemia    no current med.   Arthritis    lower back, fingers   Complication of anesthesia    states has a small mouth   Dental crowns present    Headache(784.0)    sinus - 3-4 x/month   Hypercholesteremia    Mucous cyst of finger 06/18/2012   left long finger   Past Surgical History:  Procedure Laterality Date   ABDOMINAL HYSTERECTOMY     complete   APPENDECTOMY     CATARACT EXTRACTION Bilateral    CESAREAN SECTION     ECTOPIC PREGNANCY SURGERY     MASS EXCISION  07/10/2012   Procedure: MINOR EXCISION OF MASS;  Surgeon: Lamar LULLA Leonor Mickey., MD;  Location: [facility];  Service: Orthopedics;  Laterality: Left;   OOPHORECTOMY     REPAIR OF COMPLEX TRACTION RETINAL DETACHMENT Left 12/23/2018   Procedure: REPAIR OF  RETINAL DETACHMENT LEFT EYE;  Surgeon: Tobie Baptist, MD;  Location: [facility];  Service: Ophthalmology;  Laterality: Left;   TOTAL HIP ARTHROPLASTY Right 08/12/2023   Procedure: TOTAL HIP ARTHROPLASTY ANTERIOR APPROACH;  Surgeon: Ernie Cough, MD;  Location: [facility];  Service: Orthopedics;  Laterality: Right;   Patient Active Problem List   Diagnosis Date Noted   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   Pre-op evaluation 12/22/2023   Age related osteoporosis 11/07/2023   Low hemoglobin 08/20/2023   Post-menopausal 08/20/2023   S/P total right hip arthroplasty 08/12/2023   Closed right hip fracture, initial  encounter (HCC) 08/11/2023   Closed fracture of proximal end of right humerus 08/11/2023   Hyperglycemia 08/11/2023   Hypercalcemia 08/11/2023   Primary insomnia 05/26/2023   Elevated blood pressure reading 05/26/2023    PCP: Bevin, DO  REFERRING PROVIDER: Cristy, MD  REFERRING DIAG: s/p right reverse TSA  THERAPY DIAG:  Stiffness of right shoulder, not elsewhere classified  Acute pain of right shoulder  Muscle weakness (generalized)  S/P reverse total shoulder arthroplasty, right  Rationale for Evaluation and Treatment: Rehabilitation  ONSET DATE: 01/07/24  SUBJECTIVE:  SUBJECTIVE STATEMENT:  A little bit dore form sleeping on belly with her arms up to the side    Patient had a fall 08/11/23. She sustained a right proximal humerus fracture.  She ended up with some AVN and the bone died, she underwent a right reverse TSA on 01/07/24.  She reports that she recovered pretty good, she reports that towards the last of the rehab she was found to have a small fracture in the shoulder blade, found to have osteoporosis, she has been started on a new medication for 6 weeks, her c/o to me is weakness, feels like her ROM is good and has only soreness. Hand dominance: Right  PERTINENT HISTORY: THA  PAIN:  Are you having pain? Yes: NPRS scale: 0/10 Pain location: right shoulder Pain description: ache, sore Aggravating factors: as the day goes no, lying on the right side pain up to  3-4/10 Relieving factors: ibuprofen , rest 0/10  PRECAUTIONS: none  RED FLAGS: None   WEIGHT BEARING RESTRICTIONS: No  FALLS:  Has patient fallen in last 6 months? No  LIVING ENVIRONMENT: Lives with: lives with their family Lives in: House/apartment Stairs: Yes: Internal: 12 steps; on right going up Has following  equipment at home: None  OCCUPATION: retired  PLOF: Independent and does yardwork, housework, walks dog  PATIENT GOALS: feel stronger, I can't put things on upper shelves, can't vacuum the stairs  NEXT MD VISIT:   OBJECTIVE:  Note: Objective measures were completed at Evaluation unless otherwise noted.  DIAGNOSTIC FINDINGS:  none  PATIENT SURVEYS:  Quick Dash 43%  COGNITION: Overall cognitive status: Within functional limits for tasks assessed     SENSATION: WFL  POSTURE: Fwd head, rounded shoulders, right shoulder elevated some  UPPER EXTREMITY ROM: some pain at end ranges  Passive ROM Right AROM eval Right PROM eval Right AROM 09/08/24  Shoulder flexion 110 122 140  Shoulder extension     Shoulder abduction 90 110 113  Shoulder adduction     Shoulder internal rotation 45 50 51  Shoulder external rotation 65 70 75  Elbow flexion     Elbow extension     Wrist flexion     Wrist extension     Wrist ulnar deviation     Wrist radial deviation     Wrist pronation     Wrist supination     (Blank rows = not tested)  UPPER EXTREMITY MMT: For the available ROM above MMT Right eval Left eval  Shoulder flexion 3+   Shoulder extension    Shoulder abduction 3+   Shoulder adduction    Shoulder internal rotation 3+   Shoulder external rotation 3+   Middle trapezius    Lower trapezius    Elbow flexion 4+   Elbow extension 4-   Wrist flexion    Wrist extension    Wrist ulnar deviation    Wrist radial deviation    Wrist pronation    Wrist supination    Grip strength (lbs)    (Blank rows = not tested)  JOINT MOBILITY TESTING:  Not tested  PALPATION:  Tightness in the upper trap, tight in the rhomboid  FUNCTIONAL TEST: can reach with 1# weight to head high shelf, can reach to shoulder high shelf with 2# but has to compensate and has some pain, cannot reach to shoulder height with 3#  TREATMENT DATE:  09/08/24 UBE L3 X 3 min each  Shoulder Flex 2lb 2x10  Shoulder abd 1lb 2x10  Seated rows 20lb 2x10 Lat pulls 15lb 2x10  RUE ER/IR yellow 2x10 each RUE PROM in all directions with end range holds  R GH joint mobs  09/01/24 UBE L3 X 3 min each  AAROM 3lb WaTE Flex, Ext, IR x10  Rows & ext green 2x10 Shoulder abd 1lb 2x10 RUE IR red 2x10 RUE ER yellow 2x10 RUE PROM in all directions with end range holds  R GH joint mobs  08/24/24 Evaluation   PATIENT EDUCATION: Education details: HEP/POC Person educated: Patient and Spouse Education method: Explanation, Facilities manager, Verbal cues, and Handouts Education comprehension: verbalized understanding  HOME EXERCISE PROGRAM: Access Code: [email or web address] URL: [email or web address] Date: 08/24/2024 Prepared by: Ozell Mainland  Exercises - Shoulder Flexion Wall Slide with Towel  - 2 x daily - 7 x weekly - 1 sets - 10 reps - 3 hold - Scaption Wall Slide with Towel  - 2 x daily - 7 x weekly - 1 sets - 10 reps - 3 hold - Shoulder Circles on Wall with Towel  - 2 x daily - 7 x weekly - 1 sets - 10 reps - 3 hold ASSESSMENT:  CLINICAL IMPRESSION: Patient is a 73 y.o. female who was seen today for physical therapy treatment for s/p right TSA on 01/07/24.  She had a fall in September and fractured the proximal humerus, she developed AVN and the bone was not healing.  She enters today with some soreness. She has progressed increasing her RUE AROM.  Session consisted of a progression to light RUE strengthening. Some discomfort reported with lat pull downs. PROM is really well.  Weakness present with both internal and external rotation.   OBJECTIVE IMPAIRMENTS: cardiopulmonary status limiting activity, decreased activity tolerance, decreased coordination, decreased endurance, decreased ROM, decreased strength, increased edema, increased fascial restrictions, impaired  perceived functional ability, increased muscle spasms, impaired flexibility, impaired UE functional use, impaired vision/preception, improper body mechanics, postural dysfunction, and pain.   REHAB POTENTIAL: Good  CLINICAL DECISION MAKING: Evolving/moderate complexity  EVALUATION COMPLEXITY: Moderate   GOALS: Goals reviewed with patient? Yes  SHORT TERM GOALS: Target date: 09/14/24  Independent with initial HEP Baseline: Goal status: Progressing 09/01/24, Met 09/08/24   LONG TERM GOALS: Target date: 11/17/24  Independent with advanced HEP Baseline:  Goal status: INITIAL  2.  Decrease pain 50% Baseline: pain up to 5/10 with some reaching and lying on the right side Goal status: INITIAL  3.  Report to reach into upper cabinet with a glass Baseline: can reach head high with 1# and shoulder high with 2# Goal status: INITIAL  4.  Improve Quick DASH to 20% Baseline: 43%  Goal status: INITIAL  5.  Increase AROM of the right shoulder flexion to 130 degrees to be more functional Baseline: 110 degrees flexion Goal status: Met 09/08/24  6.  Be able to reach head high cabinet with 3# Baseline: struggles with 1# to head high Goal status: INITIAL  PLAN:  PT FREQUENCY: 1X/week  PT DURATION: 12 weeks  PLANNED INTERVENTIONS: 97164- PT Re-evaluation, 97110-Therapeutic exercises, 97530- Therapeutic activity, 97112- Neuromuscular re-education, 97535- Self Care, 02859- Manual therapy, [id number]- Electrical stimulation (unattended), 97016- Vasopneumatic device, Patient/Family education, Joint mobilization, Scar mobilization, and Cryotherapy  PLAN FOR NEXT SESSION: work on functional strength  Tanda KANDICE Sorrow, PTA 09/08/2024, 9:32 AM

## 2024-09-13 ENCOUNTER — Encounter: Admitting: Urgent Care

## 2024-09-14 ENCOUNTER — Ambulatory Visit: Admitting: Physical Therapy

## 2024-09-14 ENCOUNTER — Encounter: Payer: Self-pay | Admitting: Physical Therapy

## 2024-09-14 DIAGNOSIS — Z96611 Presence of right artificial shoulder joint: Secondary | ICD-10-CM

## 2024-09-14 DIAGNOSIS — M6281 Muscle weakness (generalized): Secondary | ICD-10-CM

## 2024-09-14 DIAGNOSIS — M25511 Pain in right shoulder: Secondary | ICD-10-CM

## 2024-09-14 DIAGNOSIS — M25611 Stiffness of right shoulder, not elsewhere classified: Secondary | ICD-10-CM

## 2024-09-14 NOTE — Therapy (Signed)
 OUTPATIENT PHYSICAL THERAPY SHOULDER TREATMENT   Patient Name: Melissa Mcdowell MRN: [id number] DOB:Aug 27, 1951, 73 y.o., female Today's Date: 09/14/2024  END OF SESSION:  PT End of Session - 09/14/24 0932     Visit Number 4    Date for Recertification  11/17/24    PT Start Time 0933    PT Stop Time 1015    PT Time Calculation (min) 42 min    Activity Tolerance Patient tolerated treatment well    Behavior During Therapy [facility] for tasks assessed/performed          Past Medical History:  Diagnosis Date   Acid reflux    occasional   Anemia    no current med.   Arthritis    lower back, fingers   Complication of anesthesia    states has a small mouth   Dental crowns present    Headache(784.0)    sinus - 3-4 x/month   Hypercholesteremia    Mucous cyst of finger 06/18/2012   left long finger   Past Surgical History:  Procedure Laterality Date   ABDOMINAL HYSTERECTOMY     complete   APPENDECTOMY     CATARACT EXTRACTION Bilateral    CESAREAN SECTION     ECTOPIC PREGNANCY SURGERY     MASS EXCISION  07/10/2012   Procedure: MINOR EXCISION OF MASS;  Surgeon: Lamar LULLA Leonor Mickey., MD;  Location: [facility];  Service: Orthopedics;  Laterality: Left;   OOPHORECTOMY     REPAIR OF COMPLEX TRACTION RETINAL DETACHMENT Left 12/23/2018   Procedure: REPAIR OF  RETINAL DETACHMENT LEFT EYE;  Surgeon: Tobie Baptist, MD;  Location: [facility] OR;  Service: Ophthalmology;  Laterality: Left;   TOTAL HIP ARTHROPLASTY Right 08/12/2023   Procedure: TOTAL HIP ARTHROPLASTY ANTERIOR APPROACH;  Surgeon: Ernie Cough, MD;  Location: [facility];  Service: Orthopedics;  Laterality: Right;   Patient Active Problem List   Diagnosis Date Noted   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   Pre-op evaluation 12/22/2023   Age related osteoporosis 11/07/2023   Low hemoglobin 08/20/2023   Post-menopausal 08/20/2023   S/P total right hip arthroplasty 08/12/2023   Closed right hip fracture, initial  encounter (HCC) 08/11/2023   Closed fracture of proximal end of right humerus 08/11/2023   Hyperglycemia 08/11/2023   Hypercalcemia 08/11/2023   Primary insomnia 05/26/2023   Elevated blood pressure reading 05/26/2023    PCP: Bevin, DO  REFERRING PROVIDER: Cristy, MD  REFERRING DIAG: s/p right reverse TSA  THERAPY DIAG:  Stiffness of right shoulder, not elsewhere classified  Acute pain of right shoulder  Muscle weakness (generalized)  S/P reverse total shoulder arthroplasty, right  Rationale for Evaluation and Treatment: Rehabilitation  ONSET DATE: 01/07/24  SUBJECTIVE:  SUBJECTIVE STATEMENT:  Did some minor yard work this weekend    Patient had a fall 08/11/23. She sustained a right proximal humerus fracture.  She ended up with some AVN and the bone died, she underwent a right reverse TSA on 01/07/24.  She reports that she recovered pretty good, she reports that towards the last of the rehab she was found to have a small fracture in the shoulder blade, found to have osteoporosis, she has been started on a new medication for 6 weeks, her c/o to me is weakness, feels like her ROM is good and has only soreness. Hand dominance: Right  PERTINENT HISTORY: THA  PAIN:  Are you having pain? Yes: NPRS scale: 0/10 Pain location: right shoulder Pain description: ache, sore Aggravating factors: as the day goes no, lying on the right side pain up to  3-4/10 Relieving factors: ibuprofen , rest 0/10  PRECAUTIONS: none  RED FLAGS: None   WEIGHT BEARING RESTRICTIONS: No  FALLS:  Has patient fallen in last 6 months? No  LIVING ENVIRONMENT: Lives with: lives with their family Lives in: House/apartment Stairs: Yes: Internal: 12 steps; on right going up Has following equipment at home:  None  OCCUPATION: retired  PLOF: Independent and does yardwork, housework, walks dog  PATIENT GOALS: feel stronger, I can't put things on upper shelves, can't vacuum the stairs  NEXT MD VISIT:   OBJECTIVE:  Note: Objective measures were completed at Evaluation unless otherwise noted.  DIAGNOSTIC FINDINGS:  none  PATIENT SURVEYS:  Quick Dash 43%  COGNITION: Overall cognitive status: Within functional limits for tasks assessed     SENSATION: WFL  POSTURE: Fwd head, rounded shoulders, right shoulder elevated some  UPPER EXTREMITY ROM: some pain at end ranges  Passive ROM Right AROM eval Right PROM eval Right AROM 09/08/24  Shoulder flexion 110 122 140  Shoulder extension     Shoulder abduction 90 110 113  Shoulder adduction     Shoulder internal rotation 45 50 51  Shoulder external rotation 65 70 75  Elbow flexion     Elbow extension     Wrist flexion     Wrist extension     Wrist ulnar deviation     Wrist radial deviation     Wrist pronation     Wrist supination     (Blank rows = not tested)  UPPER EXTREMITY MMT: For the available ROM above MMT Right eval Left eval  Shoulder flexion 3+   Shoulder extension    Shoulder abduction 3+   Shoulder adduction    Shoulder internal rotation 3+   Shoulder external rotation 3+   Middle trapezius    Lower trapezius    Elbow flexion 4+   Elbow extension 4-   Wrist flexion    Wrist extension    Wrist ulnar deviation    Wrist radial deviation    Wrist pronation    Wrist supination    Grip strength (lbs)    (Blank rows = not tested)  JOINT MOBILITY TESTING:  Not tested  PALPATION:  Tightness in the upper trap, tight in the rhomboid  FUNCTIONAL TEST: can reach with 1# weight to head high shelf, can reach to shoulder high shelf with 2# but has to compensate and has some pain, cannot reach to shoulder height with 3#  TREATMENT DATE:  09/14/24 UBE L2 x 3 min each Seated rows 20lb 2x10 Lat pulls 15lb 2x10  20lb Triceps Ext 2x10 Shoulder abd 2lb 2x10  Shoulder Flex 2lb 2x10  RUE ER/IR red 2x10 each Horiz and red 2x10  09/08/24 UBE L3 X 3 min each  Shoulder Flex 2lb 2x10  Shoulder abd 1lb 2x10  Seated rows 20lb 2x10 Lat pulls 15lb 2x10  RUE ER/IR yellow 2x10 each RUE PROM in all directions with end range holds  R GH joint mobs  09/01/24 UBE L3 X 3 min each  AAROM 3lb WaTE Flex, Ext, IR x10  Rows & ext green 2x10 Shoulder abd 1lb 2x10 RUE IR red 2x10 RUE ER yellow 2x10 RUE PROM in all directions with end range holds  R GH joint mobs  08/24/24 Evaluation   PATIENT EDUCATION: Education details: HEP/POC Person educated: Patient and Spouse Education method: Programmer, Multimedia, Facilities Manager, Verbal cues, and Handouts Education comprehension: verbalized understanding  HOME EXERCISE PROGRAM: Access Code: [email or web address] URL: [email or web address] Date: 08/24/2024 Prepared by: Ozell Mainland  Exercises - Shoulder Flexion Wall Slide with Towel  - 2 x daily - 7 x weekly - 1 sets - 10 reps - 3 hold - Scaption Wall Slide with Towel  - 2 x daily - 7 x weekly - 1 sets - 10 reps - 3 hold - Shoulder Circles on Wall with Towel  - 2 x daily - 7 x weekly - 1 sets - 10 reps - 3 hold ASSESSMENT:  CLINICAL IMPRESSION: Patient is a 73 y.o. female who was seen today for physical therapy treatment for s/p right TSA on 01/07/24.  She had a fall in September and fractured the proximal humerus, she developed AVN and the bone was not healing.  She enters today reporting she was able to do some light yard work over the weekend.   Session consisted of a progression to RUE strengthening. Some discomfort reported with lat pull downs remains.  Weakness present with both internal and external rotation.   OBJECTIVE IMPAIRMENTS: cardiopulmonary status limiting activity, decreased  activity tolerance, decreased coordination, decreased endurance, decreased ROM, decreased strength, increased edema, increased fascial restrictions, impaired perceived functional ability, increased muscle spasms, impaired flexibility, impaired UE functional use, impaired vision/preception, improper body mechanics, postural dysfunction, and pain.   REHAB POTENTIAL: Good  CLINICAL DECISION MAKING: Evolving/moderate complexity  EVALUATION COMPLEXITY: Moderate   GOALS: Goals reviewed with patient? Yes  SHORT TERM GOALS: Target date: 09/14/24  Independent with initial HEP Baseline: Goal status: Progressing 09/01/24, Met 09/08/24   LONG TERM GOALS: Target date: 11/17/24  Independent with advanced HEP Baseline:  Goal status: INITIAL  2.  Decrease pain 50% Baseline: pain up to 5/10 with some reaching and lying on the right side Goal status: INITIAL  3.  Report to reach into upper cabinet with a glass Baseline: can reach head high with 1# and shoulder high with 2# Goal status: INITIAL  4.  Improve Quick DASH to 20% Baseline: 43%  Goal status: INITIAL  5.  Increase AROM of the right shoulder flexion to 130 degrees to be more functional Baseline: 110 degrees flexion Goal status: Met 09/08/24  6.  Be able to reach head high cabinet with 3# Baseline: struggles with 1# to head high Goal status: INITIAL  PLAN:  PT FREQUENCY: 1X/week  PT DURATION: 12 weeks  PLANNED INTERVENTIONS: 97164- PT Re-evaluation, 97110-Therapeutic exercises, 97530- Therapeutic activity, [id number]- Neuromuscular re-education, 97535- Self Care, [id number]- Manual therapy, [id number]- Electrical stimulation (unattended), 97016- Vasopneumatic device,  Patient/Family education, Joint mobilization, Scar mobilization, and Cryotherapy  PLAN FOR NEXT SESSION: work on functional strength  Tanda KANDICE Sorrow, PTA 09/14/2024, 9:36 AM

## 2024-09-16 ENCOUNTER — Other Ambulatory Visit: Payer: Self-pay | Admitting: Medical Genetics

## 2024-09-16 DIAGNOSIS — Z006 Encounter for examination for normal comparison and control in clinical research program: Secondary | ICD-10-CM

## 2024-09-22 ENCOUNTER — Encounter: Payer: Self-pay | Admitting: Physical Therapy

## 2024-09-22 ENCOUNTER — Ambulatory Visit: Attending: Orthopaedic Surgery | Admitting: Physical Therapy

## 2024-09-22 DIAGNOSIS — M6281 Muscle weakness (generalized): Secondary | ICD-10-CM | POA: Diagnosis present

## 2024-09-22 DIAGNOSIS — M25511 Pain in right shoulder: Secondary | ICD-10-CM | POA: Diagnosis present

## 2024-09-22 DIAGNOSIS — M25611 Stiffness of right shoulder, not elsewhere classified: Secondary | ICD-10-CM | POA: Insufficient documentation

## 2024-09-22 DIAGNOSIS — Z96611 Presence of right artificial shoulder joint: Secondary | ICD-10-CM | POA: Diagnosis present

## 2024-09-22 NOTE — Therapy (Signed)
 OUTPATIENT PHYSICAL THERAPY SHOULDER TREATMENT   Patient Name: Melissa Mcdowell MRN: [id number] DOB:[birth year], 73 y.o., female Today's Date: 09/22/2024  END OF SESSION:  PT End of Session - 09/22/24 0927     Visit Number 5    Number of Visits 13    Date for Recertification  11/17/24    Authorization Type UHC    PT Start Time [phone]    PT Stop Time 1015    PT Time Calculation (min) 48 min    Activity Tolerance Patient tolerated treatment well    Behavior During Therapy WFL for tasks assessed/performed          Past Medical History:  Diagnosis Date   Acid reflux    occasional   Anemia    no current med.   Arthritis    lower back, fingers   Complication of anesthesia    states has a small mouth   Dental crowns present    Headache(784.0)    sinus - 3-4 x/month   Hypercholesteremia    Mucous cyst of finger 06/18/2012   left long finger   Past Surgical History:  Procedure Laterality Date   ABDOMINAL HYSTERECTOMY     complete   APPENDECTOMY     CATARACT EXTRACTION Bilateral    CESAREAN SECTION     ECTOPIC PREGNANCY SURGERY     MASS EXCISION  07/10/2012   Procedure: MINOR EXCISION OF MASS;  Surgeon: Lamar LULLA Leonor Mickey., MD;  Location: [facility];  Service: Orthopedics;  Laterality: Left;   OOPHORECTOMY     REPAIR OF COMPLEX TRACTION RETINAL DETACHMENT Left 12/23/2018   Procedure: REPAIR OF  RETINAL DETACHMENT LEFT EYE;  Surgeon: Tobie Baptist, MD;  Location: [facility] OR;  Service: Ophthalmology;  Laterality: Left;   TOTAL HIP ARTHROPLASTY Right 08/12/2023   Procedure: TOTAL HIP ARTHROPLASTY ANTERIOR APPROACH;  Surgeon: Ernie Cough, MD;  Location: [facility];  Service: Orthopedics;  Laterality: Right;   Patient Active Problem List   Diagnosis Date Noted   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   Pre-op evaluation 12/22/2023   Age related osteoporosis 11/07/2023   Low hemoglobin 08/20/2023   Post-menopausal 08/20/2023   S/P total right hip arthroplasty  08/12/2023   Closed right hip fracture, initial encounter (HCC) 08/11/2023   Closed fracture of proximal end of right humerus 08/11/2023   Hyperglycemia 08/11/2023   Hypercalcemia 08/11/2023   Primary insomnia 05/26/2023   Elevated blood pressure reading 05/26/2023    PCP: Bevin, DO  REFERRING PROVIDER: Cristy, MD  REFERRING DIAG: s/p right reverse TSA  THERAPY DIAG:  Stiffness of right shoulder, not elsewhere classified  Acute pain of right shoulder  Muscle weakness (generalized)  S/P reverse total shoulder arthroplasty, right  Rationale for Evaluation and Treatment: Rehabilitation  ONSET DATE: 01/07/24  SUBJECTIVE:  SUBJECTIVE STATEMENT:  Doing well, no pain, I am trying to do a little more stuff int he yard and I do get sore and tired    Patient had a fall 08/11/23. She sustained a right proximal humerus fracture.  She ended up with some AVN and the bone died, she underwent a right reverse TSA on 01/07/24.  She reports that she recovered pretty good, she reports that towards the last of the rehab she was found to have a small fracture in the shoulder blade, found to have osteoporosis, she has been started on a new medication for 6 weeks, her c/o to me is weakness, feels like her ROM is good and has only soreness. Hand dominance: Right  PERTINENT HISTORY: THA  PAIN:  Are you having pain? Yes: NPRS scale: 0/10 Pain location: right shoulder Pain description: ache, sore Aggravating factors: as the day goes no, lying on the right side pain up to  3-4/10 Relieving factors: ibuprofen , rest 0/10  PRECAUTIONS: none  RED FLAGS: None   WEIGHT BEARING RESTRICTIONS: No  FALLS:  Has patient fallen in last 6 months? No  LIVING ENVIRONMENT: Lives with: lives with their family Lives in:  House/apartment Stairs: Yes: Internal: 12 steps; on right going up Has following equipment at home: None  OCCUPATION: retired  PLOF: Independent and does yardwork, housework, walks dog  PATIENT GOALS: feel stronger, I can't put things on upper shelves, can't vacuum the stairs  NEXT MD VISIT:   OBJECTIVE:  Note: Objective measures were completed at Evaluation unless otherwise noted.  DIAGNOSTIC FINDINGS:  none  PATIENT SURVEYS:  Quick Dash 43%  COGNITION: Overall cognitive status: Within functional limits for tasks assessed     SENSATION: WFL  POSTURE: Fwd head, rounded shoulders, right shoulder elevated some  UPPER EXTREMITY ROM: some pain at end ranges  Passive ROM Right AROM eval Right PROM eval Right AROM 09/08/24 Right AROM 09/22/24 Standing  Shoulder flexion 110 122 140 134  Shoulder extension      Shoulder abduction 90 110 113 100  Shoulder adduction      Shoulder internal rotation 45 50 51 55  Shoulder external rotation 65 70 75 60  Elbow flexion      Elbow extension      Wrist flexion      Wrist extension      Wrist ulnar deviation      Wrist radial deviation      Wrist pronation      Wrist supination      (Blank rows = not tested)  UPPER EXTREMITY MMT: For the available ROM above MMT Right eval Left eval  Shoulder flexion 3+   Shoulder extension    Shoulder abduction 3+   Shoulder adduction    Shoulder internal rotation 3+   Shoulder external rotation 3+   Middle trapezius    Lower trapezius    Elbow flexion 4+   Elbow extension 4-   Wrist flexion    Wrist extension    Wrist ulnar deviation    Wrist radial deviation    Wrist pronation    Wrist supination    Grip strength (lbs)    (Blank rows = not tested)  JOINT MOBILITY TESTING:  Not tested  PALPATION:  Tightness in the upper trap, tight in the rhomboid  FUNCTIONAL TEST: can reach with 1# weight to head high shelf, can reach to shoulder high shelf with 2# but has to  compensate and has some pain, cannot reach to shoulder  height with 3#                                                                                                                             TREATMENT DATE:  09/22/24 UBE level 3 x 6 minutes 20# seated row 20# lats x 6, then 15# x 10 5# chest press least ROM x10 2# wall slides 2# wall circles 5# biceps Supine 3# small ROM chest press, isometric circles, serratus push 2# supine ER/IR  09/14/24 UBE L2 x 3 min each Seated rows 20lb 2x10 Lat pulls 15lb 2x10  20lb Triceps Ext 2x10 Shoulder abd 2lb 2x10  Shoulder Flex 2lb 2x10  RUE ER/IR red 2x10 each Horiz and red 2x10  09/08/24 UBE L3 X 3 min each  Shoulder Flex 2lb 2x10  Shoulder abd 1lb 2x10  Seated rows 20lb 2x10 Lat pulls 15lb 2x10  RUE ER/IR yellow 2x10 each RUE PROM in all directions with end range holds  R GH joint mobs  09/01/24 UBE L3 X 3 min each  AAROM 3lb WaTE Flex, Ext, IR x10  Rows & ext green 2x10 Shoulder abd 1lb 2x10 RUE IR red 2x10 RUE ER yellow 2x10 RUE PROM in all directions with end range holds  R GH joint mobs  08/24/24 Evaluation   PATIENT EDUCATION: Education details: HEP/POC Person educated: Patient and Spouse Education method: Programmer, Multimedia, Facilities Manager, Verbal cues, and Handouts Education comprehension: verbalized understanding  HOME EXERCISE PROGRAM: Access Code: [email or web address] URL: [email or web address] Date: 08/24/2024 Prepared by: Ozell Mainland  Exercises - Shoulder Flexion Wall Slide with Towel  - 2 x daily - 7 x weekly - 1 sets - 10 reps - 3 hold - Scaption Wall Slide with Towel  - 2 x daily - 7 x weekly - 1 sets - 10 reps - 3 hold - Shoulder Circles on Wall with Towel  - 2 x daily - 7 x weekly - 1 sets - 10 reps - 3 hold ASSESSMENT:  CLINICAL IMPRESSION: Patient is a 73 y.o. female who was seen today for physical therapy treatment for s/p right TSA on 01/07/24.  She had a fall in September and fractured the  proximal humerus, she developed AVN and the bone was not healing.  She continues to be pretty active with yard work, reports sore and tired after.  I measured her ROM today in standing it is much improved from the start but less than the last time.  She still has some pain and what seems to be some instability due to poor ER/IR strength, reverted back to some supine stabilization today   OBJECTIVE IMPAIRMENTS: cardiopulmonary status limiting activity, decreased activity tolerance, decreased coordination, decreased endurance, decreased ROM, decreased strength, increased edema, increased fascial restrictions, impaired perceived functional ability, increased muscle spasms, impaired flexibility, impaired UE functional use, impaired vision/preception, improper body mechanics, postural dysfunction, and pain.   REHAB POTENTIAL: Good  CLINICAL DECISION MAKING: Evolving/moderate complexity  EVALUATION COMPLEXITY: Moderate   GOALS: Goals  reviewed with patient? Yes  SHORT TERM GOALS: Target date: 09/14/24  Independent with initial HEP Baseline: Goal status: Progressing 09/01/24, Met 09/08/24   LONG TERM GOALS: Target date: 11/17/24  Independent with advanced HEP Baseline:  Goal status: ongoing 09/22/24  2.  Decrease pain 50% Baseline: pain up to 5/10 with some reaching and lying on the right side Goal status: progressing 09/22/24  3.  Report to reach into upper cabinet with a glass Baseline: can reach head high with 1# and shoulder high with 2# Goal status: ongoing 09/22/24  4.  Improve Quick DASH to 20% Baseline: 43%  Goal status: INITIAL  5.  Increase AROM of the right shoulder flexion to 130 degrees to be more functional Baseline: 110 degrees flexion Goal status: Met 09/08/24  6.  Be able to reach head high cabinet with 3# Baseline: struggles with 1# to head high Goal status: INITIAL  PLAN:  PT FREQUENCY: 1X/week  PT DURATION: 12 weeks  PLANNED INTERVENTIONS: 97164- PT  Re-evaluation, 97110-Therapeutic exercises, 97530- Therapeutic activity, 97112- Neuromuscular re-education, 97535- Self Care, 02859- Manual therapy, G0283- Electrical stimulation (unattended), 97016- Vasopneumatic device, Patient/Family education, Joint mobilization, Scar mobilization, and Cryotherapy  PLAN FOR NEXT SESSION: work on functional strength, shoulder stability  Samanvi Cuccia W, PT 09/22/2024, 9:27 AM

## 2024-09-27 ENCOUNTER — Ambulatory Visit: Admitting: Physical Therapy

## 2024-09-27 ENCOUNTER — Encounter: Payer: Self-pay | Admitting: Physical Therapy

## 2024-09-27 DIAGNOSIS — Z96611 Presence of right artificial shoulder joint: Secondary | ICD-10-CM

## 2024-09-27 DIAGNOSIS — M25611 Stiffness of right shoulder, not elsewhere classified: Secondary | ICD-10-CM | POA: Diagnosis not present

## 2024-09-27 DIAGNOSIS — M25511 Pain in right shoulder: Secondary | ICD-10-CM

## 2024-09-27 DIAGNOSIS — M6281 Muscle weakness (generalized): Secondary | ICD-10-CM

## 2024-09-27 NOTE — Therapy (Signed)
 OUTPATIENT PHYSICAL THERAPY SHOULDER TREATMENT   Patient Name: Melissa Mcdowell MRN: [id number] DOB:1951/08/01, 73 y.o., female Today's Date: 09/27/2024  END OF SESSION:  PT End of Session - 09/27/24 1446     Visit Number 6    Number of Visits 13    Date for Recertification  11/17/24    Authorization Type UHC 5 of 12    PT Start Time 1440    PT Stop Time 1525    PT Time Calculation (min) 45 min    Activity Tolerance Patient tolerated treatment well    Behavior During Therapy WFL for tasks assessed/performed          Past Medical History:  Diagnosis Date   Acid reflux    occasional   Anemia    no current med.   Arthritis    lower back, fingers   Complication of anesthesia    states has a small mouth   Dental crowns present    Headache(784.0)    sinus - 3-4 x/month   Hypercholesteremia    Mucous cyst of finger 06/18/2012   left long finger   Past Surgical History:  Procedure Laterality Date   ABDOMINAL HYSTERECTOMY     complete   APPENDECTOMY     CATARACT EXTRACTION Bilateral    CESAREAN SECTION     ECTOPIC PREGNANCY SURGERY     MASS EXCISION  07/10/2012   Procedure: MINOR EXCISION OF MASS;  Surgeon: Lamar LULLA Leonor Mickey., MD;  Location: [facility];  Service: Orthopedics;  Laterality: Left;   OOPHORECTOMY     REPAIR OF COMPLEX TRACTION RETINAL DETACHMENT Left 12/23/2018   Procedure: REPAIR OF  RETINAL DETACHMENT LEFT EYE;  Surgeon: Tobie Baptist, MD;  Location: [facility] OR;  Service: Ophthalmology;  Laterality: Left;   TOTAL HIP ARTHROPLASTY Right 08/12/2023   Procedure: TOTAL HIP ARTHROPLASTY ANTERIOR APPROACH;  Surgeon: Ernie Cough, MD;  Location: [facility];  Service: Orthopedics;  Laterality: Right;   Patient Active Problem List   Diagnosis Date Noted   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   Pre-op evaluation 12/22/2023   Age related osteoporosis 11/07/2023   Low hemoglobin 08/20/2023   Post-menopausal 08/20/2023   S/P total right hip  arthroplasty 08/12/2023   Closed right hip fracture, initial encounter (HCC) 08/11/2023   Closed fracture of proximal end of right humerus 08/11/2023   Hyperglycemia 08/11/2023   Hypercalcemia 08/11/2023   Primary insomnia 05/26/2023   Elevated blood pressure reading 05/26/2023    PCP: Bevin, DO  REFERRING PROVIDER: Cristy, MD  REFERRING DIAG: s/p right reverse TSA  THERAPY DIAG:  Stiffness of right shoulder, not elsewhere classified  Acute pain of right shoulder  Muscle weakness (generalized)  S/P reverse total shoulder arthroplasty, right  Rationale for Evaluation and Treatment: Rehabilitation  ONSET DATE: 01/07/24  SUBJECTIVE:  SUBJECTIVE STATEMENT:  I decorated the mantle the past two days I am a little sore from reaching up so often Patient had a fall 08/11/23. She sustained a right proximal humerus fracture.  She ended up with some AVN and the bone died, she underwent a right reverse TSA on 01/07/24.  She reports that she recovered pretty good, she reports that towards the last of the rehab she was found to have a small fracture in the shoulder blade, found to have osteoporosis, she has been started on a new medication for 6 weeks, her c/o to me is weakness, feels like her ROM is good and has only soreness. Hand dominance: Right  PERTINENT HISTORY: THA  PAIN:  Are you having pain? Yes: NPRS scale: 0/10 Pain location: right shoulder Pain description: ache, sore Aggravating factors: as the day goes no, lying on the right side pain up to  3-4/10 Relieving factors: ibuprofen , rest 0/10  PRECAUTIONS: none  RED FLAGS: None   WEIGHT BEARING RESTRICTIONS: No  FALLS:  Has patient fallen in last 6 months? No  LIVING ENVIRONMENT: Lives with: lives with their family Lives in:  House/apartment Stairs: Yes: Internal: 12 steps; on right going up Has following equipment at home: None  OCCUPATION: retired  PLOF: Independent and does yardwork, housework, walks dog  PATIENT GOALS: feel stronger, I can't put things on upper shelves, can't vacuum the stairs  NEXT MD VISIT:   OBJECTIVE:  Note: Objective measures were completed at Evaluation unless otherwise noted.  DIAGNOSTIC FINDINGS:  none  PATIENT SURVEYS:  Quick Dash 43%  COGNITION: Overall cognitive status: Within functional limits for tasks assessed     SENSATION: WFL  POSTURE: Fwd head, rounded shoulders, right shoulder elevated some  UPPER EXTREMITY ROM: some pain at end ranges  Passive ROM Right AROM eval Right PROM eval Right AROM 09/08/24 Right AROM 09/22/24 Standing  Shoulder flexion 110 122 140 134  Shoulder extension      Shoulder abduction 90 110 113 100  Shoulder adduction      Shoulder internal rotation 45 50 51 55  Shoulder external rotation 65 70 75 60  Elbow flexion      Elbow extension      Wrist flexion      Wrist extension      Wrist ulnar deviation      Wrist radial deviation      Wrist pronation      Wrist supination      (Blank rows = not tested)  UPPER EXTREMITY MMT: For the available ROM above MMT Right eval Left eval  Shoulder flexion 3+   Shoulder extension    Shoulder abduction 3+   Shoulder adduction    Shoulder internal rotation 3+   Shoulder external rotation 3+   Middle trapezius    Lower trapezius    Elbow flexion 4+   Elbow extension 4-   Wrist flexion    Wrist extension    Wrist ulnar deviation    Wrist radial deviation    Wrist pronation    Wrist supination    Grip strength (lbs)    (Blank rows = not tested)  JOINT MOBILITY TESTING:  Not tested  PALPATION:  Tightness in the upper trap, tight in the rhomboid  FUNCTIONAL TEST: can reach with 1# weight to head high shelf, can reach to shoulder high shelf with 2# but has to  compensate and has some pain, cannot reach to shoulder height with 3#  TREATMENT DATE:  09/27/24 UBE level 3 x 6 minutes 5# biceps 20# triceps 20# seated row 5# face pulls 3# eccentrics from shoulder height shelf 2# eccentrics from head high 3# serratus 3# isometric circles 2# flexion supine STM to the upper traps Some passive stretch right shoulder  09/22/24 UBE level 3 x 6 minutes 20# seated row 20# lats x 6, then 15# x 10 5# chest press least ROM x10 2# wall slides 2# wall circles 5# biceps Supine 3# small ROM chest press, isometric circles, serratus push 2# supine ER/IR  09/14/24 UBE L2 x 3 min each Seated rows 20lb 2x10 Lat pulls 15lb 2x10  20lb Triceps Ext 2x10 Shoulder abd 2lb 2x10  Shoulder Flex 2lb 2x10  RUE ER/IR red 2x10 each Horiz and red 2x10  09/08/24 UBE L3 X 3 min each  Shoulder Flex 2lb 2x10  Shoulder abd 1lb 2x10  Seated rows 20lb 2x10 Lat pulls 15lb 2x10  RUE ER/IR yellow 2x10 each RUE PROM in all directions with end range holds  R GH joint mobs  09/01/24 UBE L3 X 3 min each  AAROM 3lb WaTE Flex, Ext, IR x10  Rows & ext green 2x10 Shoulder abd 1lb 2x10 RUE IR red 2x10 RUE ER yellow 2x10 RUE PROM in all directions with end range holds  R GH joint mobs  08/24/24 Evaluation   PATIENT EDUCATION: Education details: HEP/POC Person educated: Patient and Spouse Education method: Programmer, Multimedia, Facilities Manager, Verbal cues, and Handouts Education comprehension: verbalized understanding  HOME EXERCISE PROGRAM: Access Code: [email or web address] URL: [email or web address] Date: 08/24/2024 Prepared by: Ozell Mainland  Exercises - Shoulder Flexion Wall Slide with Towel  - 2 x daily - 7 x weekly - 1 sets - 10 reps - 3 hold - Scaption Wall Slide with Towel  - 2 x daily - 7 x weekly - 1 sets - 10 reps - 3 hold -  Shoulder Circles on Wall with Towel  - 2 x daily - 7 x weekly - 1 sets - 10 reps - 3 hold ASSESSMENT:  CLINICAL IMPRESSION: Continued to work on functional reach, she can now reach to shoulder height shelf with 2# but cannot go higher, I did eccentrics and this seemed to help build strength.  She was very tight with knots in the upper traps from decorating the mantel.  Patient is a 73 y.o. female who was seen today for physical therapy treatment for s/p right TSA on 01/07/24.  She had a fall in September and fractured the proximal humerus, she developed AVN and the bone was not healing.  She still has some pain and what seems to be some instability due to poor ER/IR strength, reverted back to some supine stabilization today   OBJECTIVE IMPAIRMENTS: cardiopulmonary status limiting activity, decreased activity tolerance, decreased coordination, decreased endurance, decreased ROM, decreased strength, increased edema, increased fascial restrictions, impaired perceived functional ability, increased muscle spasms, impaired flexibility, impaired UE functional use, impaired vision/preception, improper body mechanics, postural dysfunction, and pain.   REHAB POTENTIAL: Good  CLINICAL DECISION MAKING: Evolving/moderate complexity  EVALUATION COMPLEXITY: Moderate   GOALS: Goals reviewed with patient? Yes  SHORT TERM GOALS: Target date: 09/14/24  Independent with initial HEP Baseline: Goal status: Progressing 09/01/24, Met 09/08/24   LONG TERM GOALS: Target date: 11/17/24  Independent with advanced HEP Baseline:  Goal status: ongoing 09/22/24  2.  Decrease pain 50% Baseline: pain up to 5/10 with some reaching and lying on the right side Goal status: progressing 09/22/24  3.  Report  to reach into upper cabinet with a glass Baseline: can reach head high with 1# and shoulder high with 2# Goal status: ongoing 09/22/24  4.  Improve Quick DASH to 20% Baseline: 43%  Goal status: INITIAL  5.   Increase AROM of the right shoulder flexion to 130 degrees to be more functional Baseline: 110 degrees flexion Goal status: Met 09/08/24  6.  Be able to reach head high cabinet with 3# Baseline: struggles with 1# to head high Goal status: INITIAL  PLAN:  PT FREQUENCY: 1X/week  PT DURATION: 12 weeks  PLANNED INTERVENTIONS: 97164- PT Re-evaluation, 97110-Therapeutic exercises, 97530- Therapeutic activity, 97112- Neuromuscular re-education, 97535- Self Care, 02859- Manual therapy, G0283- Electrical stimulation (unattended), 97016- Vasopneumatic device, Patient/Family education, Joint mobilization, Scar mobilization, and Cryotherapy  PLAN FOR NEXT SESSION: work on functional strength, shoulder stability  Chrislyn Seedorf W, PT 09/27/2024, 2:47 PM

## 2024-10-08 ENCOUNTER — Other Ambulatory Visit: Payer: Self-pay | Admitting: Urgent Care

## 2024-10-08 NOTE — Telephone Encounter (Unsigned)
 Copied from CRM #[id number]. Topic: Clinical - Medication Refill >> Oct 08, 2024  4:50 PM Dedra B wrote: Medication: ezetimibe  (ZETIA ) 10 MG tablet  Has the patient contacted their pharmacy? Yes, pharmacy said they submitted request but I didn't see one  This is the patient's preferred pharmacy:  [facility] [id number] [place], [place] - [address] [place] [address] Phone: [phone] Fax: [phone]  Is this the correct pharmacy for this prescription? Yes  Has the prescription been filled recently? No  Is the patient out of the medication? No  Has the patient been seen for an appointment in the last year OR does the patient have an upcoming appointment? Yes  Can we respond through MyChart? Yes  Agent: Please be advised that Rx refills may take up to 3 business days. We ask that you follow-up with your pharmacy.

## 2024-10-11 LAB — GENECONNECT MOLECULAR SCREEN: Genetic Analysis Overall Interpretation: NEGATIVE

## 2024-10-11 MED ORDER — EZETIMIBE 10 MG PO TABS: 10.0000 mg | ORAL_TABLET | Freq: Every day | ORAL | 2 refills | Status: AC

## 2024-10-11 NOTE — Telephone Encounter (Signed)
 Requesting rx rf of Zetia  Last written 01/21/225 Last Lipid panel 05/26/2023 Last OV 02/03/225 Upcoming appt 11/01/2024 est PCP

## 2024-10-18 ENCOUNTER — Encounter: Payer: Self-pay | Admitting: Physical Therapy

## 2024-10-18 ENCOUNTER — Ambulatory Visit: Admitting: Physical Therapy

## 2024-10-18 DIAGNOSIS — Z96611 Presence of right artificial shoulder joint: Secondary | ICD-10-CM | POA: Diagnosis present

## 2024-10-18 DIAGNOSIS — M6281 Muscle weakness (generalized): Secondary | ICD-10-CM | POA: Diagnosis present

## 2024-10-18 DIAGNOSIS — M25611 Stiffness of right shoulder, not elsewhere classified: Secondary | ICD-10-CM | POA: Insufficient documentation

## 2024-10-18 DIAGNOSIS — M25511 Pain in right shoulder: Secondary | ICD-10-CM | POA: Insufficient documentation

## 2024-10-18 NOTE — Therapy (Signed)
 OUTPATIENT PHYSICAL THERAPY SHOULDER TREATMENT   Patient Name: Melissa Mcdowell MRN: [id number] DOB:11-16-1951, 73 y.o., female Today's Date: 10/18/2024  END OF SESSION:  PT End of Session - 10/18/24 0924     Visit Number 7    Date for Recertification  11/17/24    PT Start Time 0925    PT Stop Time 1010    PT Time Calculation (min) 45 min    Activity Tolerance Patient tolerated treatment well    Behavior During Therapy [facility] for tasks assessed/performed          Past Medical History:  Diagnosis Date   Acid reflux    occasional   Anemia    no current med.   Arthritis    lower back, fingers   Complication of anesthesia    states has a small mouth   Dental crowns present    Headache(784.0)    sinus - 3-4 x/month   Hypercholesteremia    Mucous cyst of finger 06/18/2012   left long finger   Past Surgical History:  Procedure Laterality Date   ABDOMINAL HYSTERECTOMY     complete   APPENDECTOMY     CATARACT EXTRACTION Bilateral    CESAREAN SECTION     ECTOPIC PREGNANCY SURGERY     MASS EXCISION  07/10/2012   Procedure: MINOR EXCISION OF MASS;  Surgeon: Lamar LULLA Leonor Mickey., MD;  Location: [facility];  Service: Orthopedics;  Laterality: Left;   OOPHORECTOMY     REPAIR OF COMPLEX TRACTION RETINAL DETACHMENT Left 12/23/2018   Procedure: REPAIR OF  RETINAL DETACHMENT LEFT EYE;  Surgeon: Tobie Baptist, MD;  Location: [facility] OR;  Service: Ophthalmology;  Laterality: Left;   TOTAL HIP ARTHROPLASTY Right 08/12/2023   Procedure: TOTAL HIP ARTHROPLASTY ANTERIOR APPROACH;  Surgeon: Ernie Cough, MD;  Location: [facility];  Service: Orthopedics;  Laterality: Right;   Patient Active Problem List   Diagnosis Date Noted   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   Pre-op evaluation 12/22/2023   Age related osteoporosis 11/07/2023   Low hemoglobin 08/20/2023   Post-menopausal 08/20/2023   S/P total right hip arthroplasty 08/12/2023   Closed right hip fracture, initial  encounter (HCC) 08/11/2023   Closed fracture of proximal end of right humerus 08/11/2023   Hyperglycemia 08/11/2023   Hypercalcemia 08/11/2023   Primary insomnia 05/26/2023   Elevated blood pressure reading 05/26/2023    PCP: Bevin, DO  REFERRING PROVIDER: Cristy, MD  REFERRING DIAG: s/p right reverse TSA  THERAPY DIAG:  Stiffness of right shoulder, not elsewhere classified  Acute pain of right shoulder  Muscle weakness (generalized)  S/P reverse total shoulder arthroplasty, right  Rationale for Evaluation and Treatment: Rehabilitation  ONSET DATE: 01/07/24  SUBJECTIVE:  SUBJECTIVE STATEMENT:  Im just beginning to function Slowly decorating the christmas tree.   Patient had a fall 08/11/23. She sustained a right proximal humerus fracture.  She ended up with some AVN and the bone died, she underwent a right reverse TSA on 01/07/24.  She reports that she recovered pretty good, she reports that towards the last of the rehab she was found to have a small fracture in the shoulder blade, found to have osteoporosis, she has been started on a new medication for 6 weeks, her c/o to me is weakness, feels like her ROM is good and has only soreness. Hand dominance: Right  PERTINENT HISTORY: THA  PAIN:  Are you having pain? Yes: NPRS scale: 0/10 Pain location: right shoulder Pain description: ache, sore Aggravating factors: as the day goes no, lying on the right side pain up to  3-4/10 Relieving factors: ibuprofen , rest 0/10  PRECAUTIONS: none  RED FLAGS: None   WEIGHT BEARING RESTRICTIONS: No  FALLS:  Has patient fallen in last 6 months? No  LIVING ENVIRONMENT: Lives with: lives with their family Lives in: House/apartment Stairs: Yes: Internal: 12 steps; on right going up Has following  equipment at home: None  OCCUPATION: retired  PLOF: Independent and does yardwork, housework, walks dog  PATIENT GOALS: feel stronger, I can't put things on upper shelves, can't vacuum the stairs  NEXT MD VISIT:   OBJECTIVE:  Note: Objective measures were completed at Evaluation unless otherwise noted.  DIAGNOSTIC FINDINGS:  none  PATIENT SURVEYS:  Quick Dash 43%  COGNITION: Overall cognitive status: Within functional limits for tasks assessed     SENSATION: WFL  POSTURE: Fwd head, rounded shoulders, right shoulder elevated some  UPPER EXTREMITY ROM: some pain at end ranges  Passive ROM Right AROM eval Right PROM eval Right AROM 09/08/24 Right AROM 09/22/24 Standing RIGHT AROM 10/18/24  Shoulder flexion 110 122 140 134 135  Shoulder extension       Shoulder abduction 90 110 113 100   Shoulder adduction     109  Shoulder internal rotation 45 50 51 55 55  Shoulder external rotation 65 70 75 60 60  Elbow flexion       Elbow extension       Wrist flexion       Wrist extension       Wrist ulnar deviation       Wrist radial deviation       Wrist pronation       Wrist supination       (Blank rows = not tested)  UPPER EXTREMITY MMT: For the available ROM above MMT Right eval Left eval  Shoulder flexion 3+   Shoulder extension    Shoulder abduction 3+   Shoulder adduction    Shoulder internal rotation 3+   Shoulder external rotation 3+   Middle trapezius    Lower trapezius    Elbow flexion 4+   Elbow extension 4-   Wrist flexion    Wrist extension    Wrist ulnar deviation    Wrist radial deviation    Wrist pronation    Wrist supination    Grip strength (lbs)    (Blank rows = not tested)  JOINT MOBILITY TESTING:  Not tested  PALPATION:  Tightness in the upper trap, tight in the rhomboid  FUNCTIONAL TEST: can reach with 1# weight to head high shelf, can reach to shoulder high shelf with 2# but has to compensate and has some pain, cannot reach  to  shoulder height with 3#                                                                                                                             TREATMENT DATE:  10/18/24 UBE level 3x3 min each way 1lb 2 level cabinet reaches x10 RUE flex & abd Shoulder Rows & Ext green 2x10 Seated Rows 25lb 2x10 Lat pulls 20lb2x10 Chest press 5lb 2x10 Triceps Ext 20lb 2x10 Bicep Curls 10lb 2x10 Shoulder Abd 1lb 2x10 Shoulder Flex 1lb 2x10   09/27/24 UBE level 3 x 6 minutes 5# biceps 20# triceps 20# seated row 5# face pulls 3# eccentrics from shoulder height shelf 2# eccentrics from head high 3# serratus 3# isometric circles 2# flexion supine STM to the upper traps Some passive stretch right shoulder  09/22/24 UBE level 3 x 6 minutes 20# seated row 20# lats x 6, then 15# x 10 5# chest press least ROM x10 2# wall slides 2# wall circles 5# biceps Supine 3# small ROM chest press, isometric circles, serratus push 2# supine ER/IR  09/14/24 UBE L2 x 3 min each Seated rows 20lb 2x10 Lat pulls 15lb 2x10  20lb Triceps Ext 2x10 Shoulder abd 2lb 2x10  Shoulder Flex 2lb 2x10  RUE ER/IR red 2x10 each Horiz and red 2x10  09/08/24 UBE L3 X 3 min each  Shoulder Flex 2lb 2x10  Shoulder abd 1lb 2x10  Seated rows 20lb 2x10 Lat pulls 15lb 2x10  RUE ER/IR yellow 2x10 each RUE PROM in all directions with end range holds  R GH joint mobs  09/01/24 UBE L3 X 3 min each  AAROM 3lb WaTE Flex, Ext, IR x10  Rows & ext green 2x10 Shoulder abd 1lb 2x10 RUE IR red 2x10 RUE ER yellow 2x10 RUE PROM in all directions with end range holds  R GH joint mobs  08/24/24 Evaluation   PATIENT EDUCATION: Education details: HEP/POC Person educated: Patient and Spouse Education method: Programmer, Multimedia, Facilities Manager, Verbal cues, and Handouts Education comprehension: verbalized understanding  HOME EXERCISE PROGRAM: Access Code: [email or web address] URL: [email or web address] Date:  08/24/2024 Prepared by: Ozell Mainland  Exercises - Shoulder Flexion Wall Slide with Towel  - 2 x daily - 7 x weekly - 1 sets - 10 reps - 3 hold - Scaption Wall Slide with Towel  - 2 x daily - 7 x weekly - 1 sets - 10 reps - 3 hold - Shoulder Circles on Wall with Towel  - 2 x daily - 7 x weekly - 1 sets - 10 reps - 3 hold ASSESSMENT:  CLINICAL IMPRESSION: Continued to work on functional reach, she can now reach to shoulder over shoulder height with 2lb but does fatigue quick. Shoulder elevation noted with shoulder abd and flexion. Attempted shoulder Ext with pulley but unable to complete due to weakness. Some rest during sets needed due to shoulder fatigue and weakness.     Patient is a 73 y.o. female who was seen  today for physical therapy treatment for s/p right TSA on 01/07/24.  She had a fall in September and fractured the proximal humerus, she developed AVN and the bone was not healing.  She still has some pain and what seems to be some instability due to poor ER/IR strength, reverted back to some supine stabilization today   OBJECTIVE IMPAIRMENTS: cardiopulmonary status limiting activity, decreased activity tolerance, decreased coordination, decreased endurance, decreased ROM, decreased strength, increased edema, increased fascial restrictions, impaired perceived functional ability, increased muscle spasms, impaired flexibility, impaired UE functional use, impaired vision/preception, improper body mechanics, postural dysfunction, and pain.   REHAB POTENTIAL: Good  CLINICAL DECISION MAKING: Evolving/moderate complexity  EVALUATION COMPLEXITY: Moderate   GOALS: Goals reviewed with patient? Yes  SHORT TERM GOALS: Target date: 09/14/24  Independent with initial HEP Baseline: Goal status: Progressing 09/01/24, Met 09/08/24   LONG TERM GOALS: Target date: 11/17/24  Independent with advanced HEP Baseline:  Goal status: ongoing 09/22/24  2.  Decrease pain 50% Baseline: pain up  to 5/10 with some reaching and lying on the right side Goal status: progressing 09/22/24  3.  Report to reach into upper cabinet with a glass Baseline: can reach head high with 1# and shoulder high with 2# Goal status: ongoing 09/22/24  4.  Improve Quick DASH to 20% Baseline: 43%  Goal status: INITIAL  5.  Increase AROM of the right shoulder flexion to 130 degrees to be more functional Baseline: 110 degrees flexion Goal status: Met 09/08/24  6.  Be able to reach head high cabinet with 3# Baseline: struggles with 1# to head high Goal status: Progressing 10/18/24  PLAN:  PT FREQUENCY: 1X/week  PT DURATION: 12 weeks  PLANNED INTERVENTIONS: 97164- PT Re-evaluation, 97110-Therapeutic exercises, 97530- Therapeutic activity, 97112- Neuromuscular re-education, 97535- Self Care, 02859- Manual therapy, G0283- Electrical stimulation (unattended), 97016- Vasopneumatic device, Patient/Family education, Joint mobilization, Scar mobilization, and Cryotherapy  PLAN FOR NEXT SESSION: work on functional strength, shoulder stability  Tanda KANDICE Sorrow, PTA 10/18/2024, 9:25 AM

## 2024-10-25 ENCOUNTER — Ambulatory Visit: Admitting: Physical Therapy

## 2024-10-25 ENCOUNTER — Encounter: Payer: Self-pay | Admitting: Physical Therapy

## 2024-10-25 DIAGNOSIS — Z96611 Presence of right artificial shoulder joint: Secondary | ICD-10-CM

## 2024-10-25 DIAGNOSIS — M25511 Pain in right shoulder: Secondary | ICD-10-CM

## 2024-10-25 DIAGNOSIS — M25611 Stiffness of right shoulder, not elsewhere classified: Secondary | ICD-10-CM

## 2024-10-25 DIAGNOSIS — M6281 Muscle weakness (generalized): Secondary | ICD-10-CM

## 2024-10-25 NOTE — Therapy (Signed)
 OUTPATIENT PHYSICAL THERAPY SHOULDER TREATMENT   Patient Name: Melissa Mcdowell MRN: [id number] DOB:22-May-1951, 73 y.o., female Today's Date: 10/25/2024  END OF SESSION:  PT End of Session - 10/25/24 0930     Visit Number 8    Date for Recertification  11/17/24    PT Start Time 0930    PT Stop Time 1015    PT Time Calculation (min) 45 min    Activity Tolerance Patient tolerated treatment well    Behavior During Therapy [facility] for tasks assessed/performed          Past Medical History:  Diagnosis Date   Acid reflux    occasional   Anemia    no current med.   Arthritis    lower back, fingers   Complication of anesthesia    states has a small mouth   Dental crowns present    Headache(784.0)    sinus - 3-4 x/month   Hypercholesteremia    Mucous cyst of finger 06/18/2012   left long finger   Past Surgical History:  Procedure Laterality Date   ABDOMINAL HYSTERECTOMY     complete   APPENDECTOMY     CATARACT EXTRACTION Bilateral    CESAREAN SECTION     ECTOPIC PREGNANCY SURGERY     MASS EXCISION  07/10/2012   Procedure: MINOR EXCISION OF MASS;  Surgeon: Lamar LULLA Leonor Mickey., MD;  Location: [facility];  Service: Orthopedics;  Laterality: Left;   OOPHORECTOMY     REPAIR OF COMPLEX TRACTION RETINAL DETACHMENT Left 12/23/2018   Procedure: REPAIR OF  RETINAL DETACHMENT LEFT EYE;  Surgeon: Tobie Baptist, MD;  Location: [facility] OR;  Service: Ophthalmology;  Laterality: Left;   TOTAL HIP ARTHROPLASTY Right 08/12/2023   Procedure: TOTAL HIP ARTHROPLASTY ANTERIOR APPROACH;  Surgeon: Ernie Cough, MD;  Location: [facility];  Service: Orthopedics;  Laterality: Right;   Patient Active Problem List   Diagnosis Date Noted   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   Pre-op evaluation 12/22/2023   Age related osteoporosis 11/07/2023   Low hemoglobin 08/20/2023   Post-menopausal 08/20/2023   S/P total right hip arthroplasty 08/12/2023   Closed right hip fracture, initial  encounter (HCC) 08/11/2023   Closed fracture of proximal end of right humerus 08/11/2023   Hyperglycemia 08/11/2023   Hypercalcemia 08/11/2023   Primary insomnia 05/26/2023   Elevated blood pressure reading 05/26/2023    PCP: Bevin, DO  REFERRING PROVIDER: Cristy, MD  REFERRING DIAG: s/p right reverse TSA  THERAPY DIAG:  Stiffness of right shoulder, not elsewhere classified  Acute pain of right shoulder  Muscle weakness (generalized)  S/P reverse total shoulder arthroplasty, right  Rationale for Evaluation and Treatment: Rehabilitation  ONSET DATE: 01/07/24  SUBJECTIVE:  SUBJECTIVE STATEMENT:  Pretty good, shoulder is doing ok  Patient had a fall 08/11/23. She sustained a right proximal humerus fracture.  She ended up with some AVN and the bone died, she underwent a right reverse TSA on 01/07/24.  She reports that she recovered pretty good, she reports that towards the last of the rehab she was found to have a small fracture in the shoulder blade, found to have osteoporosis, she has been started on a new medication for 6 weeks, her c/o to me is weakness, feels like her ROM is good and has only soreness. Hand dominance: Right  PERTINENT HISTORY: THA  PAIN:  Are you having pain? Yes: NPRS scale: 0/10 Pain location: right shoulder Pain description: ache, sore Aggravating factors: as the day goes no, lying on the right side pain up to  3-4/10 Relieving factors: ibuprofen , rest 0/10  PRECAUTIONS: none  RED FLAGS: None   WEIGHT BEARING RESTRICTIONS: No  FALLS:  Has patient fallen in last 6 months? No  LIVING ENVIRONMENT: Lives with: lives with their family Lives in: House/apartment Stairs: Yes: Internal: 12 steps; on right going up Has following equipment at home:  None  OCCUPATION: retired  PLOF: Independent and does yardwork, housework, walks dog  PATIENT GOALS: feel stronger, I can't put things on upper shelves, can't vacuum the stairs  NEXT MD VISIT:   OBJECTIVE:  Note: Objective measures were completed at Evaluation unless otherwise noted.  DIAGNOSTIC FINDINGS:  none  PATIENT SURVEYS:  Quick Dash 43%  COGNITION: Overall cognitive status: Within functional limits for tasks assessed     SENSATION: WFL  POSTURE: Fwd head, rounded shoulders, right shoulder elevated some  UPPER EXTREMITY ROM: some pain at end ranges  Passive ROM Right AROM eval Right PROM eval Right AROM 09/08/24 Right AROM 09/22/24 Standing RIGHT AROM 10/18/24 RIGHT AROM 10/25/24  Shoulder flexion 110 122 140 134 135 145  Shoulder extension        Shoulder abduction 90 110 113 100 109 109  Shoulder adduction        Shoulder internal rotation 45 50 51 55 55 60  Shoulder external rotation 65 70 75 60 60 83  Elbow flexion        Elbow extension        Wrist flexion        Wrist extension        Wrist ulnar deviation        Wrist radial deviation        Wrist pronation        Wrist supination        (Blank rows = not tested)  UPPER EXTREMITY MMT: For the available ROM above MMT Right eval Left eval  Shoulder flexion 3+   Shoulder extension    Shoulder abduction 3+   Shoulder adduction    Shoulder internal rotation 3+   Shoulder external rotation 3+   Middle trapezius    Lower trapezius    Elbow flexion 4+   Elbow extension 4-   Wrist flexion    Wrist extension    Wrist ulnar deviation    Wrist radial deviation    Wrist pronation    Wrist supination    Grip strength (lbs)    (Blank rows = not tested)  JOINT MOBILITY TESTING:  Not tested  PALPATION:  Tightness in the upper trap, tight in the rhomboid  FUNCTIONAL TEST: can reach with 1# weight to head high shelf, can reach to shoulder high shelf  with 2# but has to compensate and has  some pain, cannot reach to shoulder height with 3#                                                                                                                             TREATMENT DATE:  10/25/24 UBE level 3x3 min each way 1lb 2 level cabinet reaches x10 RUE flex & abd  1level 2lb Rows & Lats 25lb 2x10 Chest press 5lb 2x10 Shoulder ER/IR red 2x10 Triceps Ext 20lb 2x10 Shoulder Ext blue 2x10 10/18/24 UBE level 3x3 min each way 1lb 2 level cabinet reaches x10 RUE flex & abd Shoulder Rows & Ext green 2x10 Seated Rows 25lb 2x10 Lat pulls 20lb2x10 Chest press 5lb 2x10 Triceps Ext 20lb 2x10 Bicep Curls 10lb 2x10 Shoulder Abd 1lb 2x10 Shoulder Flex 1lb 2x10   09/27/24 UBE level 3 x 6 minutes 5# biceps 20# triceps 20# seated row 5# face pulls 3# eccentrics from shoulder height shelf 2# eccentrics from head high 3# serratus 3# isometric circles 2# flexion supine STM to the upper traps Some passive stretch right shoulder  09/22/24 UBE level 3 x 6 minutes 20# seated row 20# lats x 6, then 15# x 10 5# chest press least ROM x10 2# wall slides 2# wall circles 5# biceps Supine 3# small ROM chest press, isometric circles, serratus push 2# supine ER/IR  09/14/24 UBE L2 x 3 min each Seated rows 20lb 2x10 Lat pulls 15lb 2x10  20lb Triceps Ext 2x10 Shoulder abd 2lb 2x10  Shoulder Flex 2lb 2x10  RUE ER/IR red 2x10 each Horiz and red 2x10  09/08/24 UBE L3 X 3 min each  Shoulder Flex 2lb 2x10  Shoulder abd 1lb 2x10  Seated rows 20lb 2x10 Lat pulls 15lb 2x10  RUE ER/IR yellow 2x10 each RUE PROM in all directions with end range holds  R GH joint mobs  09/01/24 UBE L3 X 3 min each  AAROM 3lb WaTE Flex, Ext, IR x10  Rows & ext green 2x10 Shoulder abd 1lb 2x10 RUE IR red 2x10 RUE ER yellow 2x10 RUE PROM in all directions with end range holds  R GH joint mobs  08/24/24 Evaluation   PATIENT EDUCATION: Education details: HEP/POC Person educated: Patient and  Spouse Education method: Programmer, Multimedia, Facilities Manager, Verbal cues, and Handouts Education comprehension: verbalized understanding  HOME EXERCISE PROGRAM: Access Code: [email or web address] URL: [email or web address] Date: 08/24/2024 Prepared by: Ozell Mainland  Exercises - Shoulder Flexion Wall Slide with Towel  - 2 x daily - 7 x weekly - 1 sets - 10 reps - 3 hold - Scaption Wall Slide with Towel  - 2 x daily - 7 x weekly - 1 sets - 10 reps - 3 hold - Shoulder Circles on Wall with Towel  - 2 x daily - 7 x weekly - 1 sets - 10 reps - 3 hold ASSESSMENT:  CLINICAL IMPRESSION: Continued to work on functional reach, continues to fatigue quick lifting 2lb  to shoulder height. Shoulder elevation noted with shoulder abd and flexion. Tactile cues to UE needed for positioning with ER/IR. Some rest during sets needed due to shoulder fatigue and weakness. RUE AROM has improved   Patient is a 73 y.o. female who was seen today for physical therapy treatment for s/p right TSA on 01/07/24.  She had a fall in September and fractured the proximal humerus, she developed AVN and the bone was not healing.  She still has some pain and what seems to be some instability due to poor ER/IR strength, reverted back to some supine stabilization today   OBJECTIVE IMPAIRMENTS: cardiopulmonary status limiting activity, decreased activity tolerance, decreased coordination, decreased endurance, decreased ROM, decreased strength, increased edema, increased fascial restrictions, impaired perceived functional ability, increased muscle spasms, impaired flexibility, impaired UE functional use, impaired vision/preception, improper body mechanics, postural dysfunction, and pain.   REHAB POTENTIAL: Good  CLINICAL DECISION MAKING: Evolving/moderate complexity  EVALUATION COMPLEXITY: Moderate   GOALS: Goals reviewed with patient? Yes  SHORT TERM GOALS: Target date: 09/14/24  Independent with initial HEP Baseline: Goal  status: Progressing 09/01/24, Met 09/08/24   LONG TERM GOALS: Target date: 11/17/24  Independent with advanced HEP Baseline:  Goal status: ongoing 09/22/24  2.  Decrease pain 50% Baseline: pain up to 5/10 with some reaching and lying on the right side Goal status: progressing 09/22/24  3.  Report to reach into upper cabinet with a glass Baseline: can reach head high with 1# and shoulder high with 2# Goal status: ongoing 09/22/24, Progressing 10/25/24  4.  Improve Quick DASH to 20% Baseline: 43%  Goal status: INITIAL  5.  Increase AROM of the right shoulder flexion to 130 degrees to be more functional Baseline: 110 degrees flexion Goal status: Met 09/08/24  6.  Be able to reach head high cabinet with 3# Baseline: struggles with 1# to head high Goal status: Progressing 10/25/24  PLAN:  PT FREQUENCY: 1X/week  PT DURATION: 12 weeks  PLANNED INTERVENTIONS: 97164- PT Re-evaluation, 97110-Therapeutic exercises, 97530- Therapeutic activity, 97112- Neuromuscular re-education, 97535- Self Care, 02859- Manual therapy, G0283- Electrical stimulation (unattended), 97016- Vasopneumatic device, Patient/Family education, Joint mobilization, Scar mobilization, and Cryotherapy  PLAN FOR NEXT SESSION: work on functional strength, shoulder stability  Tanda KANDICE Sorrow, PTA 10/25/2024, 9:30 AM

## 2024-11-01 ENCOUNTER — Encounter: Payer: Self-pay | Admitting: Urgent Care

## 2024-11-01 ENCOUNTER — Ambulatory Visit (INDEPENDENT_AMBULATORY_CARE_PROVIDER_SITE_OTHER): Admitting: Urgent Care

## 2024-11-01 VITALS — BP 134/87 | HR 60 | Ht 63.5 in | Wt 159.0 lb

## 2024-11-01 DIAGNOSIS — Z79818 Long term (current) use of other agents affecting estrogen receptors and estrogen levels: Secondary | ICD-10-CM

## 2024-11-01 DIAGNOSIS — M8000XS Age-related osteoporosis with current pathological fracture, unspecified site, sequela: Secondary | ICD-10-CM | POA: Diagnosis not present

## 2024-11-01 DIAGNOSIS — D472 Monoclonal gammopathy: Secondary | ICD-10-CM

## 2024-11-01 DIAGNOSIS — E782 Mixed hyperlipidemia: Secondary | ICD-10-CM

## 2024-11-01 DIAGNOSIS — R7303 Prediabetes: Secondary | ICD-10-CM | POA: Diagnosis not present

## 2024-11-01 DIAGNOSIS — M8000XA Age-related osteoporosis with current pathological fracture, unspecified site, initial encounter for fracture: Secondary | ICD-10-CM

## 2024-11-01 NOTE — Progress Notes (Unsigned)
 New Patient Office Visit  Subjective:  Patient ID: Melissa Mcdowell, female    DOB: [birth year]  Age: 73 y.o. MRN: [id number]  CC:  Chief Complaint  Patient presents with   Establish Care    Weight concerns and check A1C    HPI Melissa Mcdowell presents to establish care.  Discussed the use of AI scribe software for clinical note transcription with the patient, who gave verbal consent to proceed.  History of Present Illness   Melissa Mcdowell is a 73 year old female who presents to establish care and concerns about weight management.  She is concerned about her weight, having lost ten pounds and stabilized at 150 pounds. She desires to weigh 135 pounds for her bone health. She experiences headaches if she does not eat at night when taking Tymlos and drinks a lot of water  with it.  She has a history of osteoporosis and is currently on Tymlos for treatment. She has had bone density tests in the past and follows up with Dr. Orpha. She has fractured three bones in the past year and is currently in therapy. Currently, she can lift about a pound and a half due to recent fractures and is still in therapy.  Her past medical history includes a hysterectomy due to endometriosis at a young age, and she has been on estradiol  since age 37 without complications or side effects. She has a history of macular issues, including a retinal detachment and macular pucker, and follows up with an eye specialist every six months.  She takes several supplements, including vitamin D3, vitamin C, Centrum, B12 plus folic acid, and omega seven for dry mouth and eyes. She also takes Zetia  for cholesterol management and has a history of prediabetes with an A1c of 5.8. She uses amoxicillin prior to dental procedures and has allergies to sulfa drugs, adhesive tape, morphine, codeine, alendronate , statins, and myosins, which cause nausea and vomiting.  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 She plans to spend Christmas with family in [place].       Outpatient Encounter Medications as of 11/01/2024  Medication Sig   amoxicillin (AMOXIL) 500 MG capsule Take 1,000 mg by mouth 2 (two) times daily.   ascorbic acid (VITAMIN C) 500 MG tablet Take 1,000 mg by mouth daily.   Calcium  Carbonate-Vit D-Min (CALCIUM  600+D PLUS MINERALS) 600-400 MG-UNIT TABS 1 tab p.o. twice daily   Cholecalciferol (D3) 25 MCG (1000 UT) capsule Take 1,000 Units by mouth daily.   Cobalamin Combinations (B-12 + FOLIC ACID) 2500-400 MCG TBDP Take by mouth.   estradiol  (ESTRACE ) 0.5 MG tablet Take 1 tablet (0.5 mg total) by mouth daily.   ezetimibe  (ZETIA ) 10 MG tablet Take 1 tablet (10 mg total) by mouth daily.   Fluticasone Propionate (KLS ALLER-FLO NA) Place into the nose.   loratadine (CLARITIN) 10 MG tablet Take 10 mg by mouth daily.   Multiple Vitamins-Minerals (CENTRUM MINIS WOMEN 50+) TABS Take by mouth.   Multiple Vitamins-Minerals (EYE HEALTH AREDS 2 PO) Take by mouth.   tizanidine  (ZANAFLEX ) 2 MG capsule Take 2 mg by mouth 3 (three) times daily.   TYMLOS 3120 MCG/1.56ML SOPN    vitamin k 100 MCG tablet Take 100 mcg by mouth daily.   [DISCONTINUED] alendronate  (FOSAMAX ) 70 MG tablet Take 1 tablet (70 mg total) by mouth every 7 (seven) days. Stay upright for 30 minutes after taking tablet.  No facility-administered encounter medications on file as of 11/01/2024.    Past Medical History:  Diagnosis Date   Acid reflux    occasional   Anemia    no current med.   Arthritis    lower back, fingers   Cataract    Removed autumn 2018   Complication of anesthesia    states has a small mouth   Dental crowns present    Headache(784.0)    sinus - 3-4 x/month   Hypercholesteremia    Mucous cyst of finger 06/18/2012   left long finger    Past Surgical History:  Procedure Laterality Date   ABDOMINAL HYSTERECTOMY     complete    APPENDECTOMY     CATARACT EXTRACTION Bilateral    CESAREAN SECTION     ECTOPIC PREGNANCY SURGERY     EYE SURGERY  Feb 2020   Dr Tobie   MASS EXCISION  07/10/2012   Procedure: MINOR EXCISION OF MASS;  Surgeon: Lamar LULLA Leonor Mickey., MD;  Location: [facility];  Service: Orthopedics;  Laterality: Left;   OOPHORECTOMY     REPAIR OF COMPLEX TRACTION RETINAL DETACHMENT Left 12/23/2018   Procedure: REPAIR OF  RETINAL DETACHMENT LEFT EYE;  Surgeon: Tobie Baptist, MD;  Location: [facility] OR;  Service: Ophthalmology;  Laterality: Left;   TOTAL HIP ARTHROPLASTY Right 08/12/2023   Procedure: TOTAL HIP ARTHROPLASTY ANTERIOR APPROACH;  Surgeon: Ernie Cough, MD;  Location: [facility];  Service: Orthopedics;  Laterality: Right;    Family History  Problem Relation Age of Onset   Stroke Mother    Diabetes Mother    Varicose Veins Mother    Stroke Father    Anesthesia problems Father        post-op N/V   Heart attack Father    Parkinson's disease Father    Hypertension Brother    Diabetes Brother     Social History   Socioeconomic History   Marital status: Married    Spouse name: Mishti Swanton   Number of children: Not on file   Years of education: Not on file   Highest education level: Bachelor's degree (e.g., BA, AB, BS)  Occupational History   Occupation: Retired  Tobacco Use   Smoking status: Never    Passive exposure: Never   Smokeless tobacco: Never  Vaping Use   Vaping status: Never Used  Substance and Sexual Activity   Alcohol use: Yes    Alcohol/week: 3.0 standard drinks of alcohol    Types: 3 Glasses of wine per week    Comment: rarely   Drug use: No   Sexual activity: Yes    Partners: Male    Birth control/protection: Surgical  Other Topics Concern   Not on file  Social History Narrative   Not on file   Social Drivers of Health   Tobacco Use: Low Risk (11/02/2024)   Patient History    Smoking Tobacco Use: Never    Smokeless Tobacco Use: Never    Passive  Exposure: Never  Financial Resource Strain: Low Risk (10/28/2024)   Overall Financial Resource Strain (CARDIA)    Difficulty of Paying Living Expenses: Not hard at all  Food Insecurity: No Food Insecurity (10/28/2024)   Epic    Worried About Radiation Protection Practitioner of Food in the Last Year: Never true    Ran Out of Food in the Last Year: Never true  Transportation Needs: No Transportation Needs (10/28/2024)   Epic    Lack of Transportation (Medical): No  Lack of Transportation (Non-Medical): No  Physical Activity: Insufficiently Active (10/28/2024)   Exercise Vital Sign    Days of Exercise per Week: 1 day    Minutes of Exercise per Session: 30 min  Stress: No Stress Concern Present (10/28/2024)   [employer] of Occupational Health - Occupational Stress Questionnaire    Feeling of Stress: Not at all  Social Connections: Moderately Isolated (10/28/2024)   Social Connection and Isolation Panel    Frequency of Communication with Friends and Family: Twice a week    Frequency of Social Gatherings with Friends and Family: Never    Attends Religious Services: 1 to 4 times per year    Active Member of [employer] or Organizations: No    Attends Banker Meetings: Not on file    Marital Status: Married  Catering Manager Violence: Not At Risk (05/25/2024)   Epic    Fear of Current or Ex-Partner: No    Emotionally Abused: No    Physically Abused: No    Sexually Abused: No  Depression (PHQ2-9): Low Risk (05/25/2024)   Depression (PHQ2-9)    PHQ-2 Score: 0  Alcohol Screen: Low Risk (10/28/2024)   Alcohol Screen    Last Alcohol Screening Score (AUDIT): 3  Housing: Low Risk (10/28/2024)   Epic    Unable to Pay for Housing in the Last Year: No    Number of Times Moved in the Last Year: 0    Homeless in the Last Year: No  Utilities: Not At Risk (05/25/2024)   Epic    Threatened with loss of utilities: No  Health Literacy: Not on file    ROS: as noted in HPI  Objective:  BP 134/87    Pulse 60   Ht 5' 3.5 (1.613 m)   Wt 159 lb (72.1 kg)   SpO2 97%   BMI 27.72 kg/m   Physical Exam Vitals and nursing note reviewed.  Constitutional:      General: She is not in acute distress.    Appearance: Normal appearance. She is not ill-appearing, toxic-appearing or diaphoretic.  HENT:     Head: Normocephalic and atraumatic.     Right Ear: Tympanic membrane, ear canal and external ear normal. There is no impacted cerumen.     Left Ear: Tympanic membrane, ear canal and external ear normal. There is no impacted cerumen.     Nose: Nose normal.     Mouth/Throat:     Mouth: Mucous membranes are moist.     Pharynx: Oropharynx is clear. No oropharyngeal exudate or posterior oropharyngeal erythema.  Eyes:     General: No scleral icterus.       Right eye: No discharge.        Left eye: No discharge.     Extraocular Movements: Extraocular movements intact.     Pupils: Pupils are equal, round, and reactive to light.  Neck:     Thyroid: No thyroid mass, thyromegaly or thyroid tenderness.  Cardiovascular:     Rate and Rhythm: Normal rate and regular rhythm.     Pulses: Normal pulses.     Heart sounds: No murmur heard. Pulmonary:     Effort: Pulmonary effort is normal. No respiratory distress.     Breath sounds: Normal breath sounds. No stridor. No wheezing or rhonchi.  Abdominal:     General: Abdomen is flat. Bowel sounds are normal. There is no distension.     Palpations: Abdomen is soft. There is no mass.     Tenderness:  There is no abdominal tenderness. There is no guarding.  Musculoskeletal:     Cervical back: Normal range of motion and neck supple. No rigidity or tenderness.     Right lower leg: No edema.     Left lower leg: No edema.  Lymphadenopathy:     Cervical: No cervical adenopathy.  Skin:    General: Skin is warm and dry.     Coloration: Skin is not jaundiced.     Findings: No bruising, erythema or rash.  Neurological:     General: No focal deficit present.      Mental Status: She is alert and oriented to person, place, and time.     Sensory: No sensory deficit.     Motor: No weakness.  Psychiatric:        Mood and Affect: Mood normal.        Behavior: Behavior normal.     Last CBC Lab Results  Component Value Date   WBC 6.3 08/20/2023   HGB 9.8 (L) 08/20/2023   HCT 31.4 (L) 08/20/2023   MCV 99 (H) 08/20/2023   MCH 30.9 08/20/2023   RDW 13.7 08/20/2023   PLT 322 08/20/2023   Last metabolic panel Lab Results  Component Value Date   GLUCOSE 89 02/18/2024   NA 140 02/18/2024   K 4.5 02/18/2024   CL 102 02/18/2024   CO2 22 02/18/2024   BUN 14 02/18/2024   CREATININE 1.00 02/18/2024   EGFR 60 02/18/2024   CALCIUM  9.5 02/18/2024   PROT 6.6 08/12/2023   ALBUMIN 3.6 08/12/2023   LABGLOB 2.7 05/25/2024   BILITOT 0.6 08/12/2023   ALKPHOS 55 08/12/2023   AST 22 08/12/2023   ALT 19 08/12/2023   ANIONGAP 8 08/14/2023   Last lipids Lab Results  Component Value Date   CHOL 261 (H) 05/26/2023   HDL 90 05/26/2023   LDLCALC 155 (H) 05/26/2023   TRIG 68 05/26/2023   CHOLHDL 2.9 05/26/2023   Last hemoglobin A1c Lab Results  Component Value Date   HGBA1C 5.8 (H) 08/12/2023   Last thyroid functions No results found for: TSH, T3TOTAL, T4TOTAL, FREET4, THYROIDAB Last vitamin D  No results found for: 25OHVITD2, 25OHVITD3, VD25OH Last vitamin B12 and Folate No results found for: VITAMINB12, FOLATE    Assessment & Plan:  Age-related osteoporosis with current pathological fracture, sequela [M80.00XS] -     VITAMIN D  25 Hydroxy (Vit-D Deficiency, Fractures)  Mixed hyperlipidemia -     Lipid panel  MGUS (monoclonal gammopathy of unknown significance)  Prediabetes -     CBC with Differential/Platelet -     Hemoglobin A1c -     TSH -     Lipid panel -     Comprehensive metabolic panel with GFR  Long term (current) use of other agents affecting estrogen receptors and estrogen levels -     CBC with  Differential/Platelet -     Comprehensive metabolic panel with GFR  Assessment and Plan    Age-related osteoporosis with pathological fractures Osteoporosis with recent shoulder fracture. Bone density improved to osteopenic status. - Continue Tymlos. - Encouraged mild weightlifting. - Repeat bone density test in October 2026.  Mixed hyperlipidemia Managed with Zetia . Cholesterol check overdue. - Ordered fasting lipid panel.  Prediabetes A1c at 5.8%, no aggressive treatment needed. - Ordered A1c test.  Monoclonal gammopathy of undetermined significance (MGUS) No follow-up required per hematology.  Macular pucker and history of retinal detachment Ongoing follow-up with eye specialist every 6 months. - Continue  regular follow-up with eye specialist.  General Health Maintenance Due for tetanus and pneumonia vaccinations. Shingles vaccination series completed. - Update tetanus and pneumonia vaccinations at pharmacy. - Continue routine health maintenance screenings.        Return in about 6 months (around 05/02/2025).   Benton LITTIE Gave, PA

## 2024-11-01 NOTE — Patient Instructions (Signed)
 Great to meet you. We obtained labs on you today.  Please monitor your blood pressure at home.   Return for routine follow up in 6-9 months, sooner if needed.

## 2024-11-02 ENCOUNTER — Ambulatory Visit: Admitting: Physical Therapy

## 2024-11-02 ENCOUNTER — Encounter: Payer: Self-pay | Admitting: Urgent Care

## 2024-11-02 ENCOUNTER — Ambulatory Visit: Payer: Self-pay | Admitting: Urgent Care

## 2024-11-02 ENCOUNTER — Encounter: Payer: Self-pay | Admitting: Physical Therapy

## 2024-11-02 DIAGNOSIS — Z96611 Presence of right artificial shoulder joint: Secondary | ICD-10-CM

## 2024-11-02 DIAGNOSIS — M6281 Muscle weakness (generalized): Secondary | ICD-10-CM

## 2024-11-02 DIAGNOSIS — M25611 Stiffness of right shoulder, not elsewhere classified: Secondary | ICD-10-CM | POA: Diagnosis not present

## 2024-11-02 DIAGNOSIS — M25511 Pain in right shoulder: Secondary | ICD-10-CM

## 2024-11-02 LAB — COMPREHENSIVE METABOLIC PANEL WITH GFR
ALT: 14 IU/L (ref 0–32)
AST: 22 IU/L (ref 0–40)
Albumin: 4.4 g/dL (ref 3.8–4.8)
Alkaline Phosphatase: 93 IU/L (ref 49–135)
BUN/Creatinine Ratio: 15 (ref 12–28)
BUN: 15 mg/dL (ref 8–27)
Bilirubin Total: 0.5 mg/dL (ref 0.0–1.2)
CO2: 24 mmol/L (ref 20–29)
Calcium: 10.2 mg/dL (ref 8.7–10.3)
Chloride: 103 mmol/L (ref 96–106)
Creatinine, Ser: 1.01 mg/dL — ABNORMAL HIGH (ref 0.57–1.00)
Globulin, Total: 2.2 g/dL (ref 1.5–4.5)
Glucose: 89 mg/dL (ref 70–99)
Potassium: 5 mmol/L (ref 3.5–5.2)
Sodium: 142 mmol/L (ref 134–144)
Total Protein: 6.6 g/dL (ref 6.0–8.5)
eGFR: 59 mL/min/1.73 — ABNORMAL LOW (ref >59)

## 2024-11-02 LAB — CBC WITH DIFFERENTIAL/PLATELET
Basophils Absolute: 0 x10E3/uL (ref 0.0–0.2)
Basos: 1 %
EOS (ABSOLUTE): 0.1 x10E3/uL (ref 0.0–0.4)
Eos: 1 %
Hematocrit: 40.9 % (ref 34.0–46.6)
Hemoglobin: 13.5 g/dL (ref 11.1–15.9)
Immature Grans (Abs): 0 x10E3/uL (ref 0.0–0.1)
Immature Granulocytes: 0 %
Lymphocytes Absolute: 1.9 x10E3/uL (ref 0.7–3.1)
Lymphs: 35 %
MCH: 32.7 pg (ref 26.6–33.0)
MCHC: 33 g/dL (ref 31.5–35.7)
MCV: 99 fL — ABNORMAL HIGH (ref 79–97)
Monocytes Absolute: 0.5 x10E3/uL (ref 0.1–0.9)
Monocytes: 9 %
Neutrophils Absolute: 2.9 x10E3/uL (ref 1.4–7.0)
Neutrophils: 54 %
Platelets: 227 x10E3/uL (ref 150–450)
RBC: 4.13 x10E6/uL (ref 3.77–5.28)
RDW: 13 % (ref 11.7–15.4)
WBC: 5.4 x10E3/uL (ref 3.4–10.8)

## 2024-11-02 LAB — HEMOGLOBIN A1C
Est. average glucose Bld gHb Est-mCnc: 114 mg/dL
Hgb A1c MFr Bld: 5.6 % (ref 4.8–5.6)

## 2024-11-02 LAB — TSH: TSH: 2.19 u[IU]/mL (ref 0.450–4.500)

## 2024-11-02 LAB — LIPID PANEL
Chol/HDL Ratio: 2.9 ratio (ref 0.0–4.4)
Cholesterol, Total: 272 mg/dL — ABNORMAL HIGH (ref 100–199)
HDL: 95 mg/dL (ref >39)
LDL Chol Calc (NIH): 165 mg/dL — ABNORMAL HIGH (ref 0–99)
Triglycerides: 77 mg/dL (ref 0–149)
VLDL Cholesterol Cal: 12 mg/dL (ref 5–40)

## 2024-11-02 LAB — VITAMIN D 25 HYDROXY (VIT D DEFICIENCY, FRACTURES): Vit D, 25-Hydroxy: 28 ng/mL — ABNORMAL LOW (ref 30.0–100.0)

## 2024-11-02 NOTE — Therapy (Signed)
 OUTPATIENT PHYSICAL THERAPY SHOULDER TREATMENT   Patient Name: Melissa Mcdowell MRN: [id number] DOB:1951/11/01, 73 y.o., female Today's Date: 11/02/2024  END OF SESSION:  PT End of Session - 11/02/24 0914     Visit Number 9    Date for Recertification  11/17/24    PT Start Time 0915    PT Stop Time 1000    PT Time Calculation (min) 45 min    Activity Tolerance Patient tolerated treatment well    Behavior During Therapy [facility] for tasks assessed/performed          Past Medical History:  Diagnosis Date   Acid reflux    occasional   Anemia    no current med.   Arthritis    lower back, fingers   Cataract    Removed autumn 2018   Complication of anesthesia    states has a small mouth   Dental crowns present    Headache(784.0)    sinus - 3-4 x/month   Hypercholesteremia    Mucous cyst of finger 06/18/2012   left long finger   Past Surgical History:  Procedure Laterality Date   ABDOMINAL HYSTERECTOMY     complete   APPENDECTOMY     CATARACT EXTRACTION Bilateral    CESAREAN SECTION     ECTOPIC PREGNANCY SURGERY     EYE SURGERY  Feb 2020   Dr Tobie   MASS EXCISION  07/10/2012   Procedure: MINOR EXCISION OF MASS;  Surgeon: Lamar LULLA Leonor Mickey., MD;  Location: [facility];  Service: Orthopedics;  Laterality: Left;   OOPHORECTOMY     REPAIR OF COMPLEX TRACTION RETINAL DETACHMENT Left 12/23/2018   Procedure: REPAIR OF  RETINAL DETACHMENT LEFT EYE;  Surgeon: Tobie Baptist, MD;  Location: [facility] OR;  Service: Ophthalmology;  Laterality: Left;   TOTAL HIP ARTHROPLASTY Right 08/12/2023   Procedure: TOTAL HIP ARTHROPLASTY ANTERIOR APPROACH;  Surgeon: Ernie Cough, MD;  Location: [facility];  Service: Orthopedics;  Laterality: Right;   Patient Active Problem List   Diagnosis Date Noted   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   Pre-op evaluation 12/22/2023   Age related osteoporosis 11/07/2023   Low hemoglobin 08/20/2023   Post-menopausal 08/20/2023   S/P total right  hip arthroplasty 08/12/2023   Closed right hip fracture, initial encounter (HCC) 08/11/2023   Closed fracture of proximal end of right humerus 08/11/2023   Hyperglycemia 08/11/2023   Hypercalcemia 08/11/2023   Primary insomnia 05/26/2023   Elevated blood pressure reading 05/26/2023    PCP: Bevin, DO  REFERRING PROVIDER: Cristy, MD  REFERRING DIAG: s/p right reverse TSA  THERAPY DIAG:  Stiffness of right shoulder, not elsewhere classified  Acute pain of right shoulder  Muscle weakness (generalized)  S/P reverse total shoulder arthroplasty, right  Rationale for Evaluation and Treatment: Rehabilitation  ONSET DATE: 01/07/24  SUBJECTIVE:  SUBJECTIVE STATEMENT:  Pretty good,  kind of a busy week  Patient had a fall 08/11/23. She sustained a right proximal humerus fracture.  She ended up with some AVN and the bone died, she underwent a right reverse TSA on 01/07/24.  She reports that she recovered pretty good, she reports that towards the last of the rehab she was found to have a small fracture in the shoulder blade, found to have osteoporosis, she has been started on a new medication for 6 weeks, her c/o to me is weakness, feels like her ROM is good and has only soreness. Hand dominance: Right  PERTINENT HISTORY: THA  PAIN:  Are you having pain? Yes: NPRS scale: 0/10 Pain location: right shoulder Pain description: ache, sore Aggravating factors: as the day goes no, lying on the right side pain up to  3-4/10 Relieving factors: ibuprofen , rest 0/10  PRECAUTIONS: none  RED FLAGS: None   WEIGHT BEARING RESTRICTIONS: No  FALLS:  Has patient fallen in last 6 months? No  LIVING ENVIRONMENT: Lives with: lives with their family Lives in: House/apartment Stairs: Yes: Internal: 12 steps; on right  going up Has following equipment at home: None  OCCUPATION: retired  PLOF: Independent and does yardwork, housework, walks dog  PATIENT GOALS: feel stronger, I can't put things on upper shelves, can't vacuum the stairs  NEXT MD VISIT:   OBJECTIVE:  Note: Objective measures were completed at Evaluation unless otherwise noted.  DIAGNOSTIC FINDINGS:  none  PATIENT SURVEYS:  Quick Dash 43%  COGNITION: Overall cognitive status: Within functional limits for tasks assessed     SENSATION: WFL  POSTURE: Fwd head, rounded shoulders, right shoulder elevated some  UPPER EXTREMITY ROM: some pain at end ranges  Passive ROM Right AROM eval Right PROM eval Right AROM 09/08/24 Right AROM 09/22/24 Standing RIGHT AROM 10/18/24 RIGHT AROM 10/25/24  Shoulder flexion 110 122 140 134 135 145  Shoulder extension        Shoulder abduction 90 110 113 100 109 109  Shoulder adduction        Shoulder internal rotation 45 50 51 55 55 60  Shoulder external rotation 65 70 75 60 60 83  Elbow flexion        Elbow extension        Wrist flexion        Wrist extension        Wrist ulnar deviation        Wrist radial deviation        Wrist pronation        Wrist supination        (Blank rows = not tested)  UPPER EXTREMITY MMT: For the available ROM above MMT Right eval Left eval  Shoulder flexion 3+   Shoulder extension    Shoulder abduction 3+   Shoulder adduction    Shoulder internal rotation 3+   Shoulder external rotation 3+   Middle trapezius    Lower trapezius    Elbow flexion 4+   Elbow extension 4-   Wrist flexion    Wrist extension    Wrist ulnar deviation    Wrist radial deviation    Wrist pronation    Wrist supination    Grip strength (lbs)    (Blank rows = not tested)  JOINT MOBILITY TESTING:  Not tested  PALPATION:  Tightness in the upper trap, tight in the rhomboid  FUNCTIONAL TEST: can reach with 1# weight to head high shelf, can reach to shoulder  high  shelf with 2# but has to compensate and has some pain, cannot reach to shoulder height with 3#                                                                                                                             TREATMENT DATE:  11/02/24 UBE level 3x3 min each way 2lb 2 level cabinet reaches 2x5 RUE flex  1lb 2 level cabinet reaches 2x5 RUE abd Shoulder ER/IR green 2x10 RUE shoulder flex yellow Tband 2x10 RUE shoulder abduction yellow Tband 2x10 Rows & Lats 25lb 2x10 Shoulder Ext blue 2x10   10/25/24 UBE level 3x3 min each way 1lb 2 level cabinet reaches x10 RUE flex & abd  1level 2lb Rows & Lats 25lb 2x10 Chest press 5lb 2x10 Shoulder ER/IR red 2x10 Triceps Ext 20lb 2x10 Shoulder Ext blue 2x10 10/18/24 UBE level 3x3 min each way 1lb 2 level cabinet reaches x10 RUE flex & abd Shoulder Rows & Ext green 2x10 Seated Rows 25lb 2x10 Lat pulls 20lb2x10 Chest press 5lb 2x10 Triceps Ext 20lb 2x10 Bicep Curls 10lb 2x10 Shoulder Abd 1lb 2x10 Shoulder Flex 1lb 2x10   09/27/24 UBE level 3 x 6 minutes 5# biceps 20# triceps 20# seated row 5# face pulls 3# eccentrics from shoulder height shelf 2# eccentrics from head high 3# serratus 3# isometric circles 2# flexion supine STM to the upper traps Some passive stretch right shoulder  09/22/24 UBE level 3 x 6 minutes 20# seated row 20# lats x 6, then 15# x 10 5# chest press least ROM x10 2# wall slides 2# wall circles 5# biceps Supine 3# small ROM chest press, isometric circles, serratus push 2# supine ER/IR  09/14/24 UBE L2 x 3 min each Seated rows 20lb 2x10 Lat pulls 15lb 2x10  20lb Triceps Ext 2x10 Shoulder abd 2lb 2x10  Shoulder Flex 2lb 2x10  RUE ER/IR red 2x10 each Horiz and red 2x10  09/08/24 UBE L3 X 3 min each  Shoulder Flex 2lb 2x10  Shoulder abd 1lb 2x10  Seated rows 20lb 2x10 Lat pulls 15lb 2x10  RUE ER/IR yellow 2x10 each RUE PROM in all directions with end range holds  R GH joint  mobs  09/01/24 UBE L3 X 3 min each  AAROM 3lb WaTE Flex, Ext, IR x10  Rows & ext green 2x10 Shoulder abd 1lb 2x10 RUE IR red 2x10 RUE ER yellow 2x10 RUE PROM in all directions with end range holds  R GH joint mobs  08/24/24 Evaluation   PATIENT EDUCATION: Education details: HEP/POC Person educated: Patient and Spouse Education method: Programmer, Multimedia, Facilities Manager, Verbal cues, and Handouts Education comprehension: verbalized understanding  HOME EXERCISE PROGRAM: Access Code: [email or web address] URL: [email or web address] Date: 08/24/2024 Prepared by: Ozell Mainland  Exercises - Shoulder Flexion Wall Slide with Towel  - 2 x daily - 7 x weekly - 1 sets - 10 reps - 3 hold - Scaption Wall Slide with Towel  - 2 x daily -  7 x weekly - 1 sets - 10 reps - 3 hold - Shoulder Circles on Wall with Towel  - 2 x daily - 7 x weekly - 1 sets - 10 reps - 3 hold ASSESSMENT:  CLINICAL IMPRESSION: Continued to work on functional reach, continues to fatigue quick lifting 2lb to shoulder height. She ha progressed improving her quick dash functional score. Shoulder elevation noted with shoulder abd and flexion. Tactile cues to UE needed for positioning with ER/IR, but able to tolerated increase resistance. Some rest during sets needed due to shoulder fatigue and weakness. Gave pt yellow tband and instructed to do flexion and abduction at home    Patient is a 73 y.o. female who was seen today for physical therapy treatment for s/p right TSA on 01/07/24.  She had a fall in September and fractured the proximal humerus, she developed AVN and the bone was not healing.  She still has some pain and what seems to be some instability due to poor ER/IR strength, reverted back to some supine stabilization today   OBJECTIVE IMPAIRMENTS: cardiopulmonary status limiting activity, decreased activity tolerance, decreased coordination, decreased endurance, decreased ROM, decreased strength, increased edema,  increased fascial restrictions, impaired perceived functional ability, increased muscle spasms, impaired flexibility, impaired UE functional use, impaired vision/preception, improper body mechanics, postural dysfunction, and pain.   REHAB POTENTIAL: Good  CLINICAL DECISION MAKING: Evolving/moderate complexity  EVALUATION COMPLEXITY: Moderate   GOALS: Goals reviewed with patient? Yes  SHORT TERM GOALS: Target date: 09/14/24  Independent with initial HEP Baseline: Goal status: Progressing 09/01/24, Met 09/08/24   LONG TERM GOALS: Target date: 11/17/24  Independent with advanced HEP Baseline:  Goal status: ongoing 09/22/24  2.  Decrease pain 50% Baseline: pain up to 5/10 with some reaching and lying on the right side Goal status: progressing 09/22/24, Progressing 20% 11/02/24  3.  Report to reach into upper cabinet with a glass Baseline: can reach head high with 1# and shoulder high with 2# Goal status: ongoing 09/22/24, Progressing 10/25/24  4.  Improve Quick DASH to 20% Baseline: 43%  Goal status:  22.7 / 100 = 22.7 % Progressing 11/02/24   5.  Increase AROM of the right shoulder flexion to 130 degrees to be more functional Baseline: 110 degrees flexion Goal status: Met 09/08/24  6.  Be able to reach head high cabinet with 3# Baseline: struggles with 1# to head high Goal status: Progressing 10/25/24  PLAN:  PT FREQUENCY: 1X/week  PT DURATION: 12 weeks  PLANNED INTERVENTIONS: 97164- PT Re-evaluation, 97110-Therapeutic exercises, 97530- Therapeutic activity, 97112- Neuromuscular re-education, 97535- Self Care, [id number]- Manual therapy, [id number]- Electrical stimulation (unattended), 97016- Vasopneumatic device, Patient/Family education, Joint mobilization, Scar mobilization, and Cryotherapy  PLAN FOR NEXT SESSION: work on functional strength, shoulder stability  Tanda KANDICE Sorrow, PTA 11/02/2024, 9:15 AM

## 2025-05-02 ENCOUNTER — Ambulatory Visit: Admitting: Urgent Care
